# Patient Record
Sex: Female | Born: 1940
Health system: Southern US, Community
[De-identification: ages and names within clinical notes are randomized; demographics above are authoritative.]

## PROBLEM LIST (undated history)

## (undated) DIAGNOSIS — F419 Anxiety disorder, unspecified: Secondary | ICD-10-CM

## (undated) DIAGNOSIS — K219 Gastro-esophageal reflux disease without esophagitis: Secondary | ICD-10-CM

## (undated) DIAGNOSIS — R51 Headache: Secondary | ICD-10-CM

## (undated) DIAGNOSIS — I499 Cardiac arrhythmia, unspecified: Secondary | ICD-10-CM

## (undated) DIAGNOSIS — K449 Diaphragmatic hernia without obstruction or gangrene: Secondary | ICD-10-CM

## (undated) DIAGNOSIS — R569 Unspecified convulsions: Secondary | ICD-10-CM

## (undated) DIAGNOSIS — K222 Esophageal obstruction: Secondary | ICD-10-CM

## (undated) DIAGNOSIS — I493 Ventricular premature depolarization: Secondary | ICD-10-CM

## (undated) DIAGNOSIS — E785 Hyperlipidemia, unspecified: Secondary | ICD-10-CM

## (undated) DIAGNOSIS — R011 Cardiac murmur, unspecified: Secondary | ICD-10-CM

## (undated) DIAGNOSIS — I10 Essential (primary) hypertension: Secondary | ICD-10-CM

## (undated) DIAGNOSIS — K5730 Diverticulosis of large intestine without perforation or abscess without bleeding: Secondary | ICD-10-CM

## (undated) DIAGNOSIS — T7840XA Allergy, unspecified, initial encounter: Secondary | ICD-10-CM

## (undated) DIAGNOSIS — I509 Heart failure, unspecified: Secondary | ICD-10-CM

## (undated) DIAGNOSIS — I059 Rheumatic mitral valve disease, unspecified: Secondary | ICD-10-CM

## (undated) DIAGNOSIS — D649 Anemia, unspecified: Secondary | ICD-10-CM

## (undated) HISTORY — DX: Anxiety disorder, unspecified: F41.9

## (undated) HISTORY — PX: COLONOSCOPY: SHX174

## (undated) HISTORY — PX: TUBAL LIGATION: SHX77

## (undated) HISTORY — DX: Cardiac arrhythmia, unspecified: I49.9

## (undated) HISTORY — DX: Heart failure, unspecified: I50.9

## (undated) HISTORY — DX: Headache: R51

## (undated) HISTORY — DX: Esophageal obstruction: K22.2

## (undated) HISTORY — DX: Hyperlipidemia, unspecified: E78.5

## (undated) HISTORY — DX: Essential (primary) hypertension: I10

## (undated) HISTORY — PX: ADENOIDECTOMY: SUR15

## (undated) HISTORY — DX: Gastro-esophageal reflux disease without esophagitis: K21.9

## (undated) HISTORY — DX: Allergy, unspecified, initial encounter: T78.40XA

## (undated) HISTORY — DX: Anemia, unspecified: D64.9

## (undated) HISTORY — PX: BREAST EXCISIONAL BIOPSY: SUR124

## (undated) HISTORY — DX: Diverticulosis of large intestine without perforation or abscess without bleeding: K57.30

## (undated) HISTORY — DX: Cardiac murmur, unspecified: R01.1

## (undated) HISTORY — DX: Diaphragmatic hernia without obstruction or gangrene: K44.9

## (undated) HISTORY — PX: ESOPHAGOGASTRODUODENOSCOPY: SHX1529

## (undated) HISTORY — DX: Unspecified convulsions: R56.9

---
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
## 1985-01-26 HISTORY — PX: APPENDECTOMY: SHX54

## 1985-01-26 HISTORY — PX: ABDOMINAL HYSTERECTOMY: SHX81

## 1988-01-27 HISTORY — PX: BREAST SURGERY: SHX581

## 1991-01-27 HISTORY — PX: BREAST EXCISIONAL BIOPSY: SUR124

## 2006-10-25 ENCOUNTER — Encounter: Payer: Self-pay | Admitting: Internal Medicine

## 2006-10-26 ENCOUNTER — Ambulatory Visit: Payer: Self-pay | Admitting: Internal Medicine

## 2006-10-26 DIAGNOSIS — R011 Cardiac murmur, unspecified: Secondary | ICD-10-CM | POA: Insufficient documentation

## 2006-10-26 DIAGNOSIS — J309 Allergic rhinitis, unspecified: Secondary | ICD-10-CM | POA: Insufficient documentation

## 2006-10-26 DIAGNOSIS — R51 Headache: Secondary | ICD-10-CM | POA: Insufficient documentation

## 2006-10-26 DIAGNOSIS — I10 Essential (primary) hypertension: Secondary | ICD-10-CM | POA: Insufficient documentation

## 2006-10-26 DIAGNOSIS — R5383 Other fatigue: Secondary | ICD-10-CM | POA: Insufficient documentation

## 2006-10-26 DIAGNOSIS — R569 Unspecified convulsions: Secondary | ICD-10-CM | POA: Insufficient documentation

## 2006-10-26 DIAGNOSIS — E785 Hyperlipidemia, unspecified: Secondary | ICD-10-CM | POA: Insufficient documentation

## 2006-10-26 DIAGNOSIS — R519 Headache, unspecified: Secondary | ICD-10-CM | POA: Insufficient documentation

## 2006-10-26 DIAGNOSIS — R5381 Other malaise: Secondary | ICD-10-CM | POA: Insufficient documentation

## 2006-10-26 DIAGNOSIS — M949 Disorder of cartilage, unspecified: Secondary | ICD-10-CM

## 2006-10-26 DIAGNOSIS — M899 Disorder of bone, unspecified: Secondary | ICD-10-CM | POA: Insufficient documentation

## 2006-10-26 DIAGNOSIS — N951 Menopausal and female climacteric states: Secondary | ICD-10-CM | POA: Insufficient documentation

## 2006-10-26 DIAGNOSIS — Z87448 Personal history of other diseases of urinary system: Secondary | ICD-10-CM | POA: Insufficient documentation

## 2006-10-26 LAB — CONVERTED CEMR LAB
Blood in Urine, dipstick: NEGATIVE
Calcium, Total (PTH): 9.5 mg/dL (ref 8.4–10.5)
Glucose, Urine, Semiquant: NEGATIVE
Ketones, urine, test strip: NEGATIVE
Nitrite: NEGATIVE
Protein, U semiquant: NEGATIVE
Specific Gravity, Urine: 1.01
Thyroperoxidase Ab SerPl-aCnc: 35.2 (ref 0.0–60.0)
pH: 7

## 2006-11-10 LAB — CONVERTED CEMR LAB
ALT: 16 units/L (ref 0–35)
AST: 16 units/L (ref 0–37)
Albumin: 4.1 g/dL (ref 3.5–5.2)
BUN: 17 mg/dL (ref 6–23)
Basophils Absolute: 0 10*3/uL (ref 0.0–0.1)
Calcium: 9.7 mg/dL (ref 8.4–10.5)
Chloride: 104 meq/L (ref 96–112)
Eosinophils Absolute: 0.1 10*3/uL (ref 0.0–0.6)
Free T4: 0.9 ng/dL (ref 0.6–1.6)
GFR calc non Af Amer: 67 mL/min
HDL: 72 mg/dL (ref >39.0)
MCHC: 35.3 g/dL (ref 30.0–36.0)
MCV: 89.5 fL (ref 78.0–100.0)
Monocytes Relative: 9.4 % (ref 3.0–11.0)
Platelets: 213 10*3/uL (ref 150–400)
RBC: 4.21 M/uL (ref 3.87–5.11)
Sodium: 140 meq/L (ref 135–145)
TSH: 1.5 microintl units/mL (ref 0.35–5.50)
Triglycerides: 49 mg/dL (ref 0–149)

## 2006-11-18 ENCOUNTER — Ambulatory Visit: Payer: Self-pay | Admitting: Internal Medicine

## 2006-11-26 ENCOUNTER — Encounter: Admission: RE | Admit: 2006-11-26 | Discharge: 2006-11-26 | Payer: Self-pay | Admitting: Internal Medicine

## 2006-11-30 ENCOUNTER — Ambulatory Visit: Payer: Self-pay | Admitting: Pulmonary Disease

## 2006-12-01 ENCOUNTER — Ambulatory Visit (HOSPITAL_BASED_OUTPATIENT_CLINIC_OR_DEPARTMENT_OTHER): Admission: RE | Admit: 2006-12-01 | Discharge: 2006-12-01 | Payer: Self-pay | Admitting: Pulmonary Disease

## 2006-12-21 ENCOUNTER — Ambulatory Visit: Payer: Self-pay | Admitting: Pulmonary Disease

## 2006-12-21 ENCOUNTER — Telehealth (INDEPENDENT_AMBULATORY_CARE_PROVIDER_SITE_OTHER): Payer: Self-pay | Admitting: *Deleted

## 2006-12-30 ENCOUNTER — Encounter: Payer: Self-pay | Admitting: Pulmonary Disease

## 2006-12-30 ENCOUNTER — Ambulatory Visit: Payer: Self-pay | Admitting: Pulmonary Disease

## 2006-12-30 DIAGNOSIS — G4710 Hypersomnia, unspecified: Secondary | ICD-10-CM | POA: Insufficient documentation

## 2007-01-27 HISTORY — PX: CARDIAC CATHETERIZATION: SHX172

## 2007-02-01 ENCOUNTER — Other Ambulatory Visit: Admission: RE | Admit: 2007-02-01 | Discharge: 2007-02-01 | Payer: Self-pay | Admitting: Internal Medicine

## 2007-02-01 ENCOUNTER — Encounter: Payer: Self-pay | Admitting: Internal Medicine

## 2007-02-01 ENCOUNTER — Ambulatory Visit: Payer: Self-pay | Admitting: Internal Medicine

## 2007-02-18 ENCOUNTER — Ambulatory Visit: Payer: Self-pay | Admitting: Internal Medicine

## 2007-03-01 ENCOUNTER — Ambulatory Visit: Payer: Self-pay | Admitting: Internal Medicine

## 2007-03-22 ENCOUNTER — Ambulatory Visit: Payer: Self-pay

## 2007-03-22 ENCOUNTER — Encounter: Payer: Self-pay | Admitting: Internal Medicine

## 2007-03-25 ENCOUNTER — Ambulatory Visit: Payer: Self-pay | Admitting: Internal Medicine

## 2007-03-29 ENCOUNTER — Ambulatory Visit: Payer: Self-pay | Admitting: Internal Medicine

## 2007-03-29 LAB — CONVERTED CEMR LAB
Calcium: 9.3 mg/dL (ref 8.4–10.5)
Eosinophils Absolute: 0.1 10*3/uL (ref 0.0–0.6)
Eosinophils Relative: 0.8 % (ref 0.0–5.0)
GFR calc Af Amer: 81 mL/min
GFR calc non Af Amer: 67 mL/min
Glucose, Bld: 92 mg/dL (ref 70–99)
Lymphocytes Relative: 23.9 % (ref 12.0–46.0)
MCV: 92.2 fL (ref 78.0–100.0)
Monocytes Absolute: 0.7 10*3/uL (ref 0.2–0.7)
Neutro Abs: 7 10*3/uL (ref 1.4–7.7)
Neutrophils Relative %: 67.6 % (ref 43.0–77.0)
Platelets: 205 10*3/uL (ref 150–400)
Potassium: 3.9 meq/L (ref 3.5–5.1)
Sodium: 139 meq/L (ref 135–145)
WBC: 10.4 10*3/uL (ref 4.5–10.5)

## 2007-03-30 ENCOUNTER — Inpatient Hospital Stay (HOSPITAL_BASED_OUTPATIENT_CLINIC_OR_DEPARTMENT_OTHER): Admission: RE | Admit: 2007-03-30 | Discharge: 2007-03-30 | Payer: Self-pay | Admitting: Cardiology

## 2007-03-30 ENCOUNTER — Ambulatory Visit: Payer: Self-pay | Admitting: Cardiology

## 2007-04-13 ENCOUNTER — Ambulatory Visit: Payer: Self-pay | Admitting: Internal Medicine

## 2007-06-08 ENCOUNTER — Ambulatory Visit: Payer: Self-pay | Admitting: Internal Medicine

## 2007-09-28 ENCOUNTER — Ambulatory Visit: Payer: Self-pay | Admitting: Internal Medicine

## 2007-09-28 DIAGNOSIS — R1011 Right upper quadrant pain: Secondary | ICD-10-CM | POA: Insufficient documentation

## 2007-09-28 DIAGNOSIS — R252 Cramp and spasm: Secondary | ICD-10-CM | POA: Insufficient documentation

## 2007-10-04 ENCOUNTER — Encounter: Admission: RE | Admit: 2007-10-04 | Discharge: 2007-10-04 | Payer: Self-pay | Admitting: Internal Medicine

## 2007-10-06 DIAGNOSIS — K7689 Other specified diseases of liver: Secondary | ICD-10-CM | POA: Insufficient documentation

## 2007-10-09 ENCOUNTER — Encounter: Payer: Self-pay | Admitting: Internal Medicine

## 2007-10-13 ENCOUNTER — Encounter: Admission: RE | Admit: 2007-10-13 | Discharge: 2007-10-13 | Payer: Self-pay | Admitting: Internal Medicine

## 2007-10-24 ENCOUNTER — Ambulatory Visit: Payer: Self-pay | Admitting: Internal Medicine

## 2007-10-24 DIAGNOSIS — G5760 Lesion of plantar nerve, unspecified lower limb: Secondary | ICD-10-CM | POA: Insufficient documentation

## 2007-11-02 ENCOUNTER — Ambulatory Visit: Payer: Self-pay | Admitting: Internal Medicine

## 2007-11-03 ENCOUNTER — Ambulatory Visit (HOSPITAL_COMMUNITY): Admission: RE | Admit: 2007-11-03 | Discharge: 2007-11-03 | Payer: Self-pay | Admitting: Internal Medicine

## 2007-11-18 ENCOUNTER — Ambulatory Visit: Payer: Self-pay | Admitting: Gastroenterology

## 2007-11-18 DIAGNOSIS — R1319 Other dysphagia: Secondary | ICD-10-CM | POA: Insufficient documentation

## 2007-11-18 DIAGNOSIS — K219 Gastro-esophageal reflux disease without esophagitis: Secondary | ICD-10-CM | POA: Insufficient documentation

## 2007-11-21 ENCOUNTER — Ambulatory Visit: Payer: Self-pay | Admitting: Gastroenterology

## 2007-11-21 DIAGNOSIS — K2990 Gastroduodenitis, unspecified, without bleeding: Secondary | ICD-10-CM

## 2007-11-21 DIAGNOSIS — K2970 Gastritis, unspecified, without bleeding: Secondary | ICD-10-CM | POA: Insufficient documentation

## 2007-11-21 LAB — HM COLONOSCOPY

## 2007-11-23 ENCOUNTER — Encounter: Payer: Self-pay | Admitting: Gastroenterology

## 2007-12-05 ENCOUNTER — Ambulatory Visit: Payer: Self-pay | Admitting: Internal Medicine

## 2007-12-21 ENCOUNTER — Encounter: Admission: RE | Admit: 2007-12-21 | Discharge: 2007-12-21 | Payer: Self-pay | Admitting: Internal Medicine

## 2008-01-06 ENCOUNTER — Ambulatory Visit: Payer: Self-pay | Admitting: Internal Medicine

## 2008-04-06 ENCOUNTER — Telehealth: Payer: Self-pay | Admitting: Gastroenterology

## 2008-04-16 ENCOUNTER — Encounter: Payer: Self-pay | Admitting: Internal Medicine

## 2008-04-18 ENCOUNTER — Encounter: Payer: Self-pay | Admitting: Internal Medicine

## 2008-04-18 ENCOUNTER — Ambulatory Visit: Payer: Self-pay | Admitting: Internal Medicine

## 2008-05-03 ENCOUNTER — Telehealth: Payer: Self-pay | Admitting: Gastroenterology

## 2008-05-08 ENCOUNTER — Telehealth: Payer: Self-pay | Admitting: Internal Medicine

## 2008-06-11 ENCOUNTER — Ambulatory Visit: Payer: Self-pay | Admitting: Internal Medicine

## 2008-06-11 DIAGNOSIS — J019 Acute sinusitis, unspecified: Secondary | ICD-10-CM | POA: Insufficient documentation

## 2008-06-18 ENCOUNTER — Ambulatory Visit: Payer: Self-pay | Admitting: Cardiovascular Disease

## 2008-06-18 ENCOUNTER — Ambulatory Visit: Payer: Self-pay | Admitting: Internal Medicine

## 2008-06-26 ENCOUNTER — Telehealth (INDEPENDENT_AMBULATORY_CARE_PROVIDER_SITE_OTHER): Payer: Self-pay | Admitting: *Deleted

## 2008-06-26 ENCOUNTER — Ambulatory Visit: Payer: Self-pay | Admitting: Internal Medicine

## 2008-07-23 ENCOUNTER — Telehealth: Payer: Self-pay | Admitting: *Deleted

## 2008-09-05 ENCOUNTER — Telehealth: Payer: Self-pay | Admitting: Gastroenterology

## 2008-10-17 DIAGNOSIS — K449 Diaphragmatic hernia without obstruction or gangrene: Secondary | ICD-10-CM | POA: Insufficient documentation

## 2008-10-18 ENCOUNTER — Ambulatory Visit: Payer: Self-pay | Admitting: Gastroenterology

## 2008-10-18 LAB — CONVERTED CEMR LAB
AFP-Tumor Marker: 6 ng/mL (ref 0.0–8.0)
ALT: 15 units/L (ref 0–35)
AST: 16 units/L (ref 0–37)
Basophils Relative: 0.8 % (ref 0.0–3.0)
Bilirubin, Direct: 0.1 mg/dL (ref 0.0–0.3)
CO2: 28 meq/L (ref 19–32)
Chloride: 106 meq/L (ref 96–112)
Eosinophils Absolute: 0.2 10*3/uL (ref 0.0–0.7)
Eosinophils Relative: 2.7 % (ref 0.0–5.0)
Ferritin: 71.4 ng/mL (ref 10.0–291.0)
Hemoglobin: 13.8 g/dL (ref 12.0–15.0)
Lymphocytes Relative: 36.9 % (ref 12.0–46.0)
MCHC: 34.3 g/dL (ref 30.0–36.0)
MCV: 92.2 fL (ref 78.0–100.0)
Monocytes Absolute: 0.4 10*3/uL (ref 0.1–1.0)
Neutro Abs: 3.1 10*3/uL (ref 1.4–7.7)
Potassium: 4.7 meq/L (ref 3.5–5.1)
RBC: 4.37 M/uL (ref 3.87–5.11)
Saturation Ratios: 21.4 % (ref 20.0–50.0)
Sodium: 140 meq/L (ref 135–145)
Total Protein: 6.5 g/dL (ref 6.0–8.3)
WBC: 5.9 10*3/uL (ref 4.5–10.5)

## 2008-10-24 ENCOUNTER — Encounter: Admission: RE | Admit: 2008-10-24 | Discharge: 2008-10-24 | Payer: Self-pay | Admitting: Gastroenterology

## 2008-12-21 ENCOUNTER — Encounter: Admission: RE | Admit: 2008-12-21 | Discharge: 2008-12-21 | Payer: Self-pay | Admitting: Internal Medicine

## 2009-01-26 HISTORY — PX: FOOT SURGERY: SHX648

## 2009-01-28 ENCOUNTER — Ambulatory Visit: Payer: Self-pay | Admitting: Internal Medicine

## 2009-01-28 LAB — CONVERTED CEMR LAB
ALT: 16 units/L (ref 0–35)
AST: 18 units/L (ref 0–37)
Albumin: 3.7 g/dL (ref 3.5–5.2)
Bilirubin Urine: NEGATIVE
Eosinophils Relative: 4.5 % (ref 0.0–5.0)
GFR calc non Af Amer: 76.21 mL/min (ref >60)
Glucose, Urine, Semiquant: NEGATIVE
HCT: 39.5 % (ref 36.0–46.0)
HDL: 69.6 mg/dL (ref >39.00)
Hemoglobin: 13.4 g/dL (ref 12.0–15.0)
Ketones, urine, test strip: NEGATIVE
Lymphs Abs: 2.2 10*3/uL (ref 0.7–4.0)
Monocytes Relative: 8.1 % (ref 3.0–12.0)
Neutro Abs: 2.5 10*3/uL (ref 1.4–7.7)
Potassium: 3.8 meq/L (ref 3.5–5.1)
RBC: 4.2 M/uL (ref 3.87–5.11)
Sodium: 141 meq/L (ref 135–145)
TSH: 2.43 microintl units/mL (ref 0.35–5.50)
Total Bilirubin: 0.6 mg/dL (ref 0.3–1.2)
Urobilinogen, UA: 0.2
VLDL: 20.2 mg/dL (ref 0.0–40.0)
WBC: 5.6 10*3/uL (ref 4.5–10.5)

## 2009-02-04 ENCOUNTER — Ambulatory Visit: Payer: Self-pay | Admitting: Internal Medicine

## 2009-02-04 DIAGNOSIS — R7309 Other abnormal glucose: Secondary | ICD-10-CM | POA: Insufficient documentation

## 2009-02-08 ENCOUNTER — Encounter: Payer: Self-pay | Admitting: Internal Medicine

## 2009-02-08 ENCOUNTER — Ambulatory Visit: Payer: Self-pay | Admitting: Internal Medicine

## 2009-02-13 ENCOUNTER — Telehealth: Payer: Self-pay | Admitting: Internal Medicine

## 2009-03-01 ENCOUNTER — Ambulatory Visit: Payer: Self-pay | Admitting: Internal Medicine

## 2009-08-07 ENCOUNTER — Ambulatory Visit: Payer: Self-pay | Admitting: Internal Medicine

## 2009-08-07 DIAGNOSIS — S80929A Unspecified superficial injury of unspecified lower leg, initial encounter: Secondary | ICD-10-CM

## 2009-08-07 DIAGNOSIS — S8010XA Contusion of unspecified lower leg, initial encounter: Secondary | ICD-10-CM | POA: Insufficient documentation

## 2009-08-07 DIAGNOSIS — S70919A Unspecified superficial injury of unspecified hip, initial encounter: Secondary | ICD-10-CM | POA: Insufficient documentation

## 2009-08-07 DIAGNOSIS — S90919A Unspecified superficial injury of unspecified ankle, initial encounter: Secondary | ICD-10-CM

## 2009-08-07 DIAGNOSIS — S70929A Unspecified superficial injury of unspecified thigh, initial encounter: Secondary | ICD-10-CM

## 2009-08-16 ENCOUNTER — Ambulatory Visit: Payer: Self-pay | Admitting: Internal Medicine

## 2009-10-30 ENCOUNTER — Ambulatory Visit: Payer: Self-pay | Admitting: Internal Medicine

## 2009-12-23 ENCOUNTER — Encounter: Admission: RE | Admit: 2009-12-23 | Discharge: 2009-12-23 | Payer: Self-pay | Admitting: Internal Medicine

## 2010-02-25 NOTE — Assessment & Plan Note (Signed)
Summary: leg trauma/dm   Vital Signs:  Patient profile:   70 year old female Menstrual status:  hysterectomy Weight:      148 pounds Temp:     98.0 degrees F oral Pulse rate:   78 / minute BP sitting:   110 / 70  (right arm) Cuff size:   regular  Vitals Entered By: Romualdo Bolk, CMA (AAMA) (August 07, 2009 2:11 PM) CC: Pt got hit in the back of the leg with a dolly filled with plastic tubs. Pt is having sharp knife pains in her left lower leg with some tingling coming down her ankle towards the top of her foot. Pt is having a hard time walking. This happen 11:15am at [employer] [facility] [employer] on [place] [place]. Pt did put ice on it for 10-15 mins.   History of Present Illness: Yanet Balliet comes in today  for acute  visit  with husband  for above .  DOA  about 11 30 am   at [facility]   .  was at the  pharmacy department  when a   cart pushed by employee   ran into  her  back left leg   full force .  she had immediate pain and tenderness       ....able to walk afterward   and  used ice and ibuprofen  x 1 .  Has some pain to calf and foot  .    now is  swelling .  No prev injury  Unsure what to do.     NOt on asa. No hx of dvt.      Preventive Screening-Counseling & Management  Alcohol-Tobacco     Alcohol drinks/day: <1     Alcohol type: wine     Smoking Status: never     Passive Smoke Exposure: no  Caffeine-Diet-Exercise     Caffeine use/day: 2     Does Patient Exercise: yes     Type of exercise: walks bid     Times/week: 7  Current Medications (verified): 1)  Multiple Vitamin   Tabs (Multiple Vitamin) .[name].. 1 By Mouth Once Daily 2)  Calcium Carbonate-Vitamin D 600-400 Mg-Unit  Tabs (Calcium Carbonate-Vitamin D) 3)  Premarin 0.625 Mg  Tabs (Estrogens Conjugated) .[name].. 1 By Mouth Once Daily 4)  Msm 1000 Mg Caps (Methylsulfonylmethane) .... One Tablet By Mouth Once Daily 5)  Verapamil Hcl Cr 180 Mg Xr24h-Cap (Verapamil Hcl) .... Take One Capsule By Mouth Daily 6)  Aspirin 81 Mg  Tabs  (Aspirin) .[name].. 1 By Mouth Once Daily 7)  Atenolol 25 Mg Tabs (Atenolol) .... Take One Tablet By Mouth Once Daily 8)  Nexium 40 Mg  Cpdr (Esomeprazole Magnesium) .[name].. 1 Capsule Each Day 30 Minutes Before Meal 9)  Krill Oil 1000 Mg Caps (Krill Oil)  Allergies (verified): 1)  ! Codeine 2)  ! Tenormin 3)  ! Neurontin 4)  ! Darvocet  Past History:  Past medical, surgical, family and social histories (including risk factors) reviewed for relevance to current acute and chronic problems.  Past Medical History: Reviewed history from 06/18/2008 and no changes required. Allergic rhinitis Congestive heart failure Headache Hyperlipidemia Hypertension Seizure disorder  inactive  G1 P1 colonoscopy 2005      Past Surgical History: Reviewed history from 06/18/2008 and no changes required. Breast Bx1990 Appendectomy87 Hysterectomy87 Tonsillectomy53 Adenoids removed   Past History:  Care Management: Cardiology: Ladona Ridgel Podiatry: Heloise Beecham Gastroenterology: Jarold Motto  Family History: Reviewed history from 10/18/2008 and no changes required. Family History of Arthritis  Family History Breast cancer 1st degree relative <50 grandparent Family History Diabetes 1st degree relative son and mother  No FH of Colon Cancer:  Social History: Reviewed history from 02/04/2009 and no changes required. Retired Married Never Smoked Alcohol use-no Drug use-no Regular exercise-yes   HH of 2  dog and birds  Review of Systems  The patient denies anorexia, fever, chest pain, syncope, abnormal bleeding, enlarged lymph nodes, and angioedema.    Physical Exam  General:  Well-developed,well-nourished,in no acute distress; alert,appropriate and cooperative throughout examination here with husband  Head:  normocephalic and atraumatic.   Heart:  normal rate and regular rhythm.   Msk:  no joint tenderness, no joint swelling, no joint warmth, no redness over joints, and no joint deformities.   Pulses:   pulses intact without delay   Extremities:  left   lower calf   at gastroc attachment  with    8 x 5 cm [place] bruising  and  swelling tender.  Achilles intact     neg homans   very tender  to touch.  Ankle without swelling  no bony tenderness Neurologic:  alert & oriented X3.  non focal   no obvious deficit gait a bit antalgic  Skin:  turgor normal.  bruising   and hematoma ( see extremity)   Psych:  Oriented X3, good eye contact, not anxious appearing, and not depressed appearing.     Impression & Recommendations:  Problem # 1:  CONTUSION, LOWER LEG, LEFT (ICD-924.10) with hematoma .   from   direct contact .   2-3 hour old injury   .   need rest and  elevation and ice  and monitoring for complications  tendon seems intact .   Problem # 2:  INJURY, LEG (ICD-916.8) Assessment: Comment Only  Complete Medication List: 1)  Multiple Vitamin Tabs (Multiple vitamin) .[name].. 1 by mouth once daily 2)  Calcium Carbonate-vitamin D 600-400 Mg-unit Tabs (Calcium carbonate-vitamin d) 3)  Premarin 0.625 Mg Tabs (Estrogens conjugated) .[name].. 1 by mouth once daily 4)  Msm 1000 Mg Caps (methylsulfonylmethane)  .... One tablet by mouth once daily 5)  Verapamil Hcl Cr 180 Mg Xr24h-cap (Verapamil hcl) .... Take one capsule by mouth daily 6)  Aspirin 81 Mg Tabs (Aspirin) .[name].. 1 by mouth once daily 7)  Atenolol 25 Mg Tabs (Atenolol) .... Take one tablet by mouth once daily 8)  Nexium 40 Mg Cpdr (Esomeprazole magnesium) .[name].. 1 capsule each day 30 minutes before meal 9)  Krill Oil 1000 Mg Caps (Krill oil)  Patient Instructions: 1)  You have  a significant    left  muscle calf contusion  and hematoma . 2)  Leg up and rest for the next 24-48 hours with ice and compression.    Ice 10 minutes   off 20      Off an on to decrease swelling   and  pain.  3)  ROV   next week or as needed . 4)  call  if  concerns in the meantime .

## 2010-02-25 NOTE — Assessment & Plan Note (Signed)
Summary: CPX/CJR   Vital Signs:  Patient profile:   70 year old female Menstrual status:  hysterectomy Height:      65 inches Weight:      149 pounds BMI:     24.88 Pulse rate:   60 / minute BP sitting:   100 / 62  (right arm) Cuff size:   regular  Vitals Entered By: Romualdo Bolk, CMA (AAMA) (February 04, 2009 10:04 AM) CC: CPX- Discuss doing a pap   History of Present Illness: Alisha Brown comesin today for above.  Since last visit  here  there have been no major changes in health status  . Last pap years ago no abnormals.   had hyst for non cancer reasons HRT: tried to back off  of meds and got poor sleep and hot flushes.  Abd Pain: Dr Jarold Motto felt not related  Poss wall pain . NO progression or new associated signs . burning pain like  before making dinner  better  after laying down.     HT,Arrythmia: MVP  To see Dr Ladona Ridgel  Feb 4th. on meds and no change in symptom .    Preventive Care Screening  Last Tetanus Booster:    Date:  01/27/2003    Results:  Historical   Prior Values:    Mammogram:  ASSESSMENT: Negative - BI-RADS 1^MM DIGITAL SCREENING (12/21/2008)    Colonoscopy:  Location:  [facility].   (11/21/2007)   Preventive Screening-Counseling & Management  Alcohol-Tobacco     Alcohol drinks/day: <1     Alcohol type: wine     Smoking Status: never     Passive Smoke Exposure: no  Caffeine-Diet-Exercise     Caffeine use/day: 2     Does Patient Exercise: yes     Type of exercise: walks bid     Times/week: 7  Hep-HIV-STD-Contraception     Sun Exposure-Excessive: no  Safety-Violence-Falls     Seat Belt Use: 100     Smoke Detectors: yes  Current Medications (verified): 1)  Multiple Vitamin   Tabs (Multiple Vitamin) .[name].. 1 By Mouth Once Daily 2)  Calcium Carbonate-Vitamin D 600-400 Mg-Unit  Tabs (Calcium Carbonate-Vitamin D) 3)  Premarin 0.625 Mg  Tabs (Estrogens Conjugated) .[name].. 1 By Mouth Once Daily 4)  Msm 1000 Mg Caps  (Methylsulfonylmethane) .... One Tablet By Mouth Once Daily 5)  Verapamil Hcl Cr 180 Mg Xr24h-Cap (Verapamil Hcl) .... Take One Capsule By Mouth Daily 6)  Aspirin 81 Mg  Tabs (Aspirin) .[name].. 1 By Mouth Once Daily 7)  Atenolol 25 Mg Tabs (Atenolol) .... Take One Half Tablet By Mouth Daily 8)  Nexium 40 Mg  Cpdr (Esomeprazole Magnesium) .[name].. 1 Capsule Each Day 30 Minutes Before Meal  Allergies (verified): 1)  ! Codeine 2)  ! Tenormin 3)  ! Neurontin 4)  ! Darvocet  Past History:  Past medical, surgical, family and social histories (including risk factors) reviewed, and no changes noted (except as noted below).  Past Medical History: Reviewed history from 06/18/2008 and no changes required. Allergic rhinitis Congestive heart failure Headache Hyperlipidemia Hypertension Seizure disorder  inactive  G1 P1 colonoscopy 2005      Past Surgical History: Reviewed history from 06/18/2008 and no changes required. Breast Bx1990 Appendectomy87 Hysterectomy87 Tonsillectomy53 Adenoids removed   Past History:  Care Management: Cardiology: Ladona Ridgel Podiatry: Heloise Beecham Gastroenterology: Jarold Motto  Family History: Reviewed history from 10/18/2008 and no changes required. Family History of Arthritis Family History Breast cancer 1st degree relative <50 grandparent  Family History Diabetes 1st degree relative son and mother  No FH of Colon Cancer:  Social History: Reviewed history from 06/18/2008 and no changes required. Retired Married Never Smoked Alcohol use-no Drug use-no Regular exercise-yes   HH of 2  dog and birds Sun Exposure-Excessive:  no  Review of Systems  The patient denies anorexia, fever, weight loss, weight gain, vision loss, decreased hearing, hoarseness, chest pain, syncope, dyspnea on exertion, peripheral edema, prolonged cough, headaches, hemoptysis, abdominal pain, melena, hematochezia, severe indigestion/heartburn, hematuria, muscle weakness, suspicious skin  lesions, transient blindness, difficulty walking, depression, unusual weight change, abnormal bleeding, enlarged lymph nodes, angioedema, and breast masses.         side pain   as before.   some limitation  of exercise  in cold.   no chest pain.    Physical Exam General Appearance: well developed, well nourished, no acute distress Eyes: conjunctiva and lids normal, PERRLA, EOMI,  WNL Ears, Nose, Mouth, Throat: TM clear, nares clear, oral exam WNL Neck: supple, no lymphadenopathy, no thyromegaly, no JVD Respiratory: clear to auscultation and percussion, respiratory effort normal Cardiovascular: regular rate and rhythm, S1-S2, intermittent premature beat and 1/6 systolic mlsb no radiation, rub or gallop, no bruits, peripheral pulses normal and symmetric, no cyanosis, clubbing, edema or varicosities Chest: no scars, masses, tenderness; no asymmetry, skin changes, nipple discharge   Gastrointestinal: soft, non-tender; no hepatosplenomegaly, masses; active bowel sounds all quadrants, rectal per GI   points to midline rib  T 10 area of discomfort near CC junction.  no mass or crepitus Genitourinary: no vaginal discharge, lesions; no masses or tenderness cx absent  Lymphatic: no cervical, axillary or inguinal adenopathy Musculoskeletal: gait normal, muscle tone and strength WNL, no joint swelling, effusions, discoloration, crepitus  Skin: clear, good turgor, color WNL, no rashes, lesions, or ulcerations Neurologic: normal mental status, normal reflexes, normal strength, sensation, and motion Psychiatric: alert; oriented to person, place and time Other Exam: reviewed labs   elevated Tc and FBS    Impression & Recommendations:  Problem # 1:  HEALTH MAINTENANCE EXAM (ICD-V70.0) Discussed nutrition,exercise,diet,healthy weight, vitamin D and calcium.   rec DEXa not done  in last 3-4 years .  Problem # 2:  HYPERLIPIDEMIA (ICD-272.4) lifestyle intervention for now  Labs Reviewed: SGOT: 18  (01/28/2009)   SGPT: 16 (01/28/2009)   HDL:69.60 (01/28/2009), 72.0 (10/26/2006)  LDL:DEL (10/26/2006)  Chol:228 (01/28/2009), 242 (10/26/2006)  Trig:101.0 (01/28/2009), 49 (10/26/2006)  Problem # 3:  HYPERGLYCEMIA (ICD-790.29) borderline   counseled .  follow n  Problem # 4:  ROUTINE GYNECOLOGICAL EXAMINATION (ICD-V72.31) normal pelvic  Problem # 5:  MENOPAUSE-RELATED VASOMOTOR SYMPTOMS, HOT FLASHES (ICD-627.2) minimize use as possible  Her updated medication list for this problem includes:    Premarin 0.625 Mg Tabs (Estrogens conjugated) .[name]... 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 1 by mouth once daily    Atenolol 25 Mg Tabs (Atenolol) .[name]... Take one half tablet by mouth daily  Problem # 7:  HYPERTENSION (ICD-401.9)  Her updated medication list for this problem includes:    Verapamil Hcl Cr 180 Mg Xr24h-cap (Verapamil hcl) .[name]... Take one capsule by mouth daily    Atenolol 25 Mg Tabs (Atenolol) .[name]... 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 1 by mouth once daily 2)  Calcium Carbonate-vitamin D 600-400 Mg-unit Tabs (Calcium carbonate-vitamin d) 3)  Premarin 0.625 Mg Tabs (Estrogens conjugated) .[name].. 1 by mouth once daily 4)  Msm 1000 Mg Caps (methylsulfonylmethane)  .... One tablet by mouth once daily 5)  Verapamil Hcl Cr 180 Mg Xr24h-cap (Verapamil  hcl) .... Take one capsule by mouth daily 6)  Aspirin 81 Mg Tabs (Aspirin) .[name].. 1 by mouth once daily 7)  Atenolol 25 Mg Tabs (Atenolol) .... Take one half tablet by mouth daily 8)  Nexium 40 Mg Cpdr (Esomeprazole magnesium) .[name].. 1 capsule each day 30 minutes before meal  Other Orders: Admin 1st Vaccine (04540) Flu Vaccine 3yrs + (98119)  Patient Instructions: 1)  ok to minimize  hrt for now.  2)  decrease sugars and sweets.   3)  increase exercise as tolerated.  4)  consider yoga and or tai  chi. 5)  Avoid animal fats   trans fats  to get lipids better.  6)  recheck one year of as needed. 7)  DEXa scan  to be done ..will call result.  Contraindications/Deferment of Procedures/Staging:    Test/Procedure: Pneumovax vaccine    Reason for deferment: patient declined     Test/Procedure: Zoster vaccine    Reason for deferment: declined  Flu Vaccine Consent Questions     Do you have a history of severe allergic reactions to this vaccine? no    Any prior history of allergic reactions to egg and/or gelatin? no    Do you have a sensitivity to the preservative Thimersol? no    Do you have a past history of Guillan-Barre Syndrome? no    Do you currently have an acute febrile illness? no    Have you ever had a severe reaction to latex? no    Vaccine information given and explained to patient? yes    Are you currently pregnant? no    Lot Number:[id number]   Exp Date:07/25/2009   Site Given  Left Deltoid IMbflu Romualdo Bolk, CMA (AAMA)  February 04, 2009 11:18 AM

## 2010-02-25 NOTE — Assessment & Plan Note (Signed)
Summary: ONE WK ROV // RS   Vital Signs:  Patient profile:   70 year old female Menstrual status:  hysterectomy Weight:      149 pounds Pulse rate:   66 / minute BP sitting:   110 / 62  (right arm) Cuff size:   regular  Vitals Entered By: Romualdo Bolk, CMA (AAMA) (August 16, 2009 1:47 PM) CC: Follow-up visit- Pt has a knot on back of left leg with sensitivity to touch. Pt also has a knot on the front of same leg as well.   History of Present Illness: Alisha Brown comes in today  for follow up of her injury to left lower extermitiy.   Her pain  is a lot better  and now can walk   without problem   still tender to touch and   swollen in that area. No aable to walk normally without a limp.  Noted swelling prominence nt  at left lateral to tibia mid leg ? if new or important.   Preventive Screening-Counseling & Management  Alcohol-Tobacco     Alcohol drinks/day: <1     Alcohol type: wine     Smoking Status: never     Passive Smoke Exposure: no  Caffeine-Diet-Exercise     Caffeine use/day: 2     Does Patient Exercise: yes     Type of exercise: walks bid     Times/week: 7  Current Medications (verified): 1)  Multiple Vitamin   Tabs (Multiple Vitamin) .[name].. 1 By Mouth Once Daily 2)  Calcium Carbonate-Vitamin D 600-400 Mg-Unit  Tabs (Calcium Carbonate-Vitamin D) 3)  Premarin 0.625 Mg  Tabs (Estrogens Conjugated) .[name].. 1 By Mouth Once Daily 4)  Msm 1000 Mg Caps (Methylsulfonylmethane) .... One Tablet By Mouth Once Daily 5)  Verapamil Hcl Cr 180 Mg Xr24h-Cap (Verapamil Hcl) .... Take One Capsule By Mouth Daily 6)  Aspirin 81 Mg  Tabs (Aspirin) .[name].. 1 By Mouth Once Daily 7)  Atenolol 25 Mg Tabs (Atenolol) .... Take One Tablet By Mouth Once Daily 8)  Nexium 40 Mg  Cpdr (Esomeprazole Magnesium) .[name].. 1 Capsule Each Day 30 Minutes Before Meal 9)  Krill Oil 1000 Mg Caps (Krill Oil)  Allergies (verified): 1)  ! Codeine 2)  ! Tenormin 3)  ! Neurontin 4)  ! Darvocet  Past  History:  Past medical, surgical, family and social histories (including risk factors) reviewed for relevance to current acute and chronic problems.  Past Medical History: Reviewed history from 06/18/2008 and no changes required. Allergic rhinitis Congestive heart failure Headache Hyperlipidemia Hypertension Seizure disorder  inactive  G1 P1 colonoscopy 2005      Past Surgical History: Reviewed history from 06/18/2008 and no changes required. Breast Bx1990 Appendectomy87 Hysterectomy87 Tonsillectomy53 Adenoids removed   Past History:  Care Management: Cardiology: Ladona Ridgel Podiatry: Heloise Beecham Gastroenterology: Jarold Motto  Family History: Reviewed history from 10/18/2008 and no changes required. Family History of Arthritis Family History Breast cancer 1st degree relative <50 grandparent Family History Diabetes 1st degree relative son and mother  No FH of Colon Cancer:  Social History: Reviewed history from 02/04/2009 and no changes required. Retired Married Never Smoked Alcohol use-no Drug use-no Regular exercise-yes   HH of 2  dog and birds  Review of Systems       no numbness other bruising    joint swelling  Physical Exam  General:  Well-developed,well-nourished,in no acute distress; alert,appropriate and cooperative throughout examination Extremities:  left LE  nl rom function walking and strength .  still with 5 cm lump and fading bruising over posterior distal calf.  Achilles intact and neg homans sign.   No joint swelling..  Left paratibial area with  slight swelling palpable non tender  no bony changes .  Gait and heel toe walk is normal now.  Neurologic:  no acute findings Skin:  turgor normal and color normal.  brising still present  posterior distal left calf area.  Psych:  Oriented X3, good eye contact, not anxious appearing, and not depressed appearing.     Impression & Recommendations:  Problem # 1:  INJURY, LEG (ICD-916.8) Assessment  Improved expect recovery    but will be tender for a while   Problem # 2:  CONTUSION, LOWER LEG, LEFT (ICD-924.10) Assessment: Improved see above  Complete Medication List: 1)  Multiple Vitamin Tabs (Multiple vitamin) .[name].. 1 by mouth once daily 2)  Calcium Carbonate-vitamin D 600-400 Mg-unit Tabs (Calcium carbonate-vitamin d) 3)  Premarin 0.625 Mg Tabs (Estrogens conjugated) .[name].. 1 by mouth once daily 4)  Msm 1000 Mg Caps (methylsulfonylmethane)  .... One tablet by mouth once daily 5)  Verapamil Hcl Cr 180 Mg Xr24h-cap (Verapamil hcl) .... Take one capsule by mouth daily 6)  Aspirin 81 Mg Tabs (Aspirin) .[name].. 1 by mouth once daily 7)  Atenolol 25 Mg Tabs (Atenolol) .... Take one tablet by mouth once daily 8)  Nexium 40 Mg Cpdr (Esomeprazole magnesium) .[name].. 1 capsule each day 30 minutes before meal 9)  Krill Oil 1000 Mg Caps (Krill oil)  Patient Instructions: 1)  i think you will continue to recover.   with no dysfunction. 2)  Ice if needed. 3)  Activity as tolerated . 4)  You may have a lump  for a while  ( months)   when tenderness is gone can use heat and massage  to decrease the  swelling area( hematoma)

## 2010-02-25 NOTE — Assessment & Plan Note (Signed)
Summary: rov  Medications Added ATENOLOL 25 MG TABS (ATENOLOL) take one tablet by mouth once daily      Allergies Added:   Referring Provider:  n/a Primary Provider:  Malena Edman  CC:  pt states no complaints at this time.  She did point out that she has been having sinus headaches and pressure for the last week and her BP is higher than normal.  She also states that Dr. Fabian Sharp wanted her cardiologist to be aware that her coping method for chest pain is Brandy and water.  [name]  History of Present Illness: [name] returns today for followup.  She is a pleasant 70 yo woman with a h/o NSVT and chronic chest pain.  She has had her dose of Verapamil and atenolol adjusted over the years.  She denies c/p.  She has had minimal palpitations since we last saw her.  She has occaisional non-exertional episodes of c/p that she treats with [facility] and water with improvement.  Current Medications (verified): 1)  Multiple Vitamin   Tabs (Multiple Vitamin) .[name].. 1 By Mouth Once Daily 2)  Calcium Carbonate-Vitamin D 600-400 Mg-Unit  Tabs (Calcium Carbonate-Vitamin D) 3)  Premarin 0.625 Mg  Tabs (Estrogens Conjugated) .[name].. 1 By Mouth Once Daily 4)  Msm 1000 Mg Caps (Methylsulfonylmethane) .... One Tablet By Mouth Once Daily 5)  Verapamil Hcl Cr 180 Mg Xr24h-Cap (Verapamil Hcl) .... Take One Capsule By Mouth Daily 6)  Aspirin 81 Mg  Tabs (Aspirin) .[name].. 1 By Mouth Once Daily 7)  Atenolol 25 Mg Tabs (Atenolol) .... Take One Half Tablet By Mouth Daily 8)  Nexium 40 Mg  Cpdr (Esomeprazole Magnesium) .[name].. 1 Capsule Each Day 30 Minutes Before Meal  Allergies (verified): 1)  ! Codeine 2)  ! Tenormin 3)  ! Neurontin 4)  ! Darvocet  Past History:  Past Medical History: Last updated: 06/18/2008 Allergic rhinitis Congestive heart failure Headache Hyperlipidemia Hypertension Seizure disorder  inactive  G1 P1 colonoscopy 2005      Past Surgical History: Last updated: 06/18/2008 Breast  Bx1990 Appendectomy87 Hysterectomy87 Tonsillectomy53 Adenoids removed   Review of Systems       The patient complains of chest pain.  The patient denies syncope, dyspnea on exertion, and peripheral edema.    Vital Signs:  Patient profile:   70 year old female Menstrual status:  hysterectomy Height:      65 inches Weight:      150 pounds BMI:     25.05 Pulse rate:   59 / minute Pulse rhythm:   irregular BP sitting:   126 / 82  (left arm) Cuff size:   large  Vitals Entered By: Judithe Modest CMA (March 01, 2009 11:48 AM)  Physical Exam  General:  Well developed, well nourished, no acute distress.healthy appearing.   Head:  Normocephalic and atraumatic. Eyes:  PERRLA, no icterus. Mouth:  pharynx pink and moist.  drainage tracts seen no pnd  Neck:  Supple; no masses or thyromegaly. Lungs:  Clear throughout to auscultation. No wheezes, rales, rhonchi. Heart:  Regular rate and rhythm; no murmurs, rubs,  or bruits. Abdomen:  Soft, nontender and nondistended. No masses, hepatosplenomegaly or hernias noted. Normal bowel sounds. Msk:  Symmetrical with no gross deformities. Normal posture. Pulses:  pulses normal in all 4 extremities Extremities:  No clubbing, cyanosis, edema or deformities noted. Neurologic:  Alert and  oriented x4;  grossly normal neurologically.   EKG  Procedure date:  03/01/2009  Findings:  Sinus bradycardia with rate of:  59.PVC's noted.   LAE.  ILBBB.  Impression & Recommendations:  Problem # 1:  CARDIAC ARRHYTHMIA/NONSUST VT (ICD-427.9) She has had NSVT in the past which now appears to be asymptomatic in the setting of her medical therapy.  Will continue. Her updated medication list for this problem includes:    Verapamil Hcl Cr 180 Mg Xr24h-cap (Verapamil hcl) .[name]... Take one capsule by mouth daily    Aspirin 81 Mg Tabs (Aspirin) .[name]... 1 by mouth once daily    Atenolol 25 Mg Tabs (Atenolol) .[name]... Take one tablet by mouth once  daily  Orders: EKG w/ Interpretation (93000)  Problem # 2:  HYPERTENSION (ICD-401.9) Her blood pressure appears to remain well controlled. A low sodium diet is recommended. Her updated medication list for this problem includes:    Verapamil Hcl Cr 180 Mg Xr24h-cap (Verapamil hcl) .[name]... Take one capsule by mouth daily    Aspirin 81 Mg Tabs (Aspirin) .[name]... 1 by mouth once daily    Atenolol 25 Mg Tabs (Atenolol) .[name]... 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  Continue current meds. Her updated medication list for this problem includes:    Verapamil Hcl Cr 180 Mg Xr24h-cap (Verapamil hcl) .[name]... Take one capsule by mouth daily    Aspirin 81 Mg Tabs (Aspirin) .[name]... 1 by mouth once daily    Atenolol 25 Mg Tabs (Atenolol) .[name]... Take one tablet by mouth once daily Prescriptions: ATENOLOL 25 MG TABS (ATENOLOL) take one tablet by mouth once daily  #90 x 3   Entered by:   Judithe Modest CMA   Authorized by:   Laren Boom, MD, [facility]   Signed by:   Judithe Modest CMA on 03/01/2009   Method used:   Electronically to        [facility] # [phone]* (retail)       [address]       [place], [place]  [address]       Ph: [phone]       Fax: [phone]   RxID:   [id number] VERAPAMIL HCL CR 180 MG XR24H-CAP (VERAPAMIL HCL) Take one capsule by mouth daily  #90 x 3   Entered by:   Judithe Modest CMA   Authorized by:   Laren Boom, MD, [facility]   Signed by:   Judithe Modest CMA on 03/01/2009   Method used:   Electronically to        [facility] [address] # [phone]* (retail)       [address]       [place], [place]  [address]       Ph: [phone]       Fax: [phone]   RxID:   [id number]

## 2010-02-25 NOTE — Miscellaneous (Signed)
Summary: BONE DENSITY  Clinical Lists Changes  Orders: Added new Test order of T-Bone Densitometry (77080) - Signed Added new Test order of T-Lumbar Vertebral Assessment (77082) - Signed 

## 2010-02-25 NOTE — Assessment & Plan Note (Signed)
Summary: sinuses//ccm   Vital Signs:  Patient profile:   70 year old female Menstrual status:  hysterectomy Weight:      150 pounds Temp:     98.2 degrees F oral Pulse rate:   66 / minute BP sitting:   120 / 70  (left arm) Cuff size:   regular  Vitals Entered By: Romualdo Bolk, CMA (AAMA) (October 30, 2009 10:38 AM) CC: Sinus pressure, ha's taking mucinex and flonase, coughing and congestion x 2 weeks.   History of Present Illness: Alisha Brown comes in today  for acute issue   onset like  upper congestion and then continues with   headache .  No fever.    onset about 2 weeks ago with congestion and ear  issues and then pain behind eye  head and mucinex and ibuprofen and then zyrtec  .   Got palpitations ? from t he zyrted ? if d or now.  Minimal cough no sob or wheezing .     Preventive Screening-Counseling & Management  Alcohol-Tobacco     Alcohol drinks/day: <1     Alcohol type: wine     Smoking Status: never     Passive Smoke Exposure: no  Caffeine-Diet-Exercise     Caffeine use/day: 2     Does Patient Exercise: yes     Type of exercise: walks bid     Times/week: 7  Current Medications (verified): 1)  Multiple Vitamin   Tabs (Multiple Vitamin) .[name].. 1 By Mouth Once Daily 2)  Calcium Carbonate-Vitamin D 600-400 Mg-Unit  Tabs (Calcium Carbonate-Vitamin D) 3)  Premarin 0.625 Mg  Tabs (Estrogens Conjugated) .[name].. 1 By Mouth Once Daily 4)  Msm 1000 Mg Caps (Methylsulfonylmethane) .... One Tablet By Mouth Once Daily 5)  Verapamil Hcl Cr 180 Mg Xr24h-Cap (Verapamil Hcl) .... Take One Capsule By Mouth Daily 6)  Atenolol 25 Mg Tabs (Atenolol) .... Take One Tablet By Mouth Once Daily 7)  Nexium 40 Mg  Cpdr (Esomeprazole Magnesium) .[name].. 1 Capsule Each Day 30 Minutes Before Meal 8)  Krill Oil 1000 Mg Caps (Krill Oil)  Allergies (verified): 1)  ! Codeine 2)  ! Tenormin 3)  ! Neurontin 4)  ! Darvocet  Past History:  Past medical, surgical, family and social  histories (including risk factors) reviewed for relevance to current acute and chronic problems.  Past Medical History: Reviewed history from 06/18/2008 and no changes required. Allergic rhinitis Congestive heart failure Headache Hyperlipidemia Hypertension Seizure disorder  inactive  G1 P1 colonoscopy 2005      Past Surgical History: Reviewed history from 06/18/2008 and no changes required. Breast Bx1990 Appendectomy87 Hysterectomy87 Tonsillectomy53 Adenoids removed   Past History:  Care Management: Cardiology: Ladona Ridgel Podiatry: Heloise Beecham Gastroenterology: Jarold Motto  Family History: Reviewed history from 10/18/2008 and no changes required. Family History of Arthritis Family History Breast cancer 1st degree relative <50 grandparent Family History Diabetes 1st degree relative son and mother  No FH of Colon Cancer:  Social History: Reviewed history from 02/04/2009 and no changes required. Retired Married Never Smoked Alcohol use-no Drug use-no Regular exercise-yes   HH of 2  dog and birds  Review of Systems  The patient denies fever, vision loss, chest pain, syncope, dyspnea on exertion, peripheral edema, prolonged cough, abdominal pain, melena, hematochezia, transient blindness, difficulty walking, abnormal bleeding, enlarged lymph nodes, angioedema, hoarseness, and hemoptysis.         achy today no fever  had diarrhea weeks ago   leg still sore when  puts on lotion other wise no change  . Says crill oil giver her more energy  Physical Exam  General:  tired but in nad nontoxic     Head:  normocephalic and atraumatic.   Eyes:  vision grossly intact, pupils equal, pupils round, and pupils reactive to light.   Ears:  R ear normal, L ear normal, and no external deformities.   Nose:  no external deformity and no external erythema.  mild congestion tender maxillary area  not frontal no edema Mouth:  mild erythema    no lesions Neck:  No deformities, masses, or  tenderness noted. Lungs:  Normal respiratory effort, chest expands symmetrically. Lungs are clear to auscultation, no crackles or wheezes.no dullness.   Heart:  normal rate, no murmur, no gallop, and no lifts.   ocass premature beat  Pulses:  pulses intact without delay   Extremities:  no clubbing cyanosis or edema  Neurologic:  non focal  Skin:  turgor normal, color normal, no ecchymoses, and no petechiae.   Cervical Nodes:  No lymphadenopathy noted shoddy ac and tender right  Psych:  Oriented X3, memory intact for recent and remote, normally interactive, good eye contact, not anxious appearing, and not depressed appearing.     Impression & Recommendations:  Problem # 1:  SINUSITIS - ACUTE-NOS (ICD-461.9)  hx of same agree with restarting flonase  and continuing  . Expectant management  Her updated medication list for this problem includes:    Cefuroxime Axetil 500 Mg Tabs (Cefuroxime axetil) .[name]... 1 by mouth two times a day    Flonase 50 Mcg/act Susp (Fluticasone propionate) .[name]... 2 spray each nostril q d  Instructed on treatment. Call if symptoms persist or worsen.   Problem # 2:  Preventive Health Care (ICD-V70.0) ok for flu shot   Problem # 3:  ALLERGIC RHINITIS (ICD-477.9)  Discussed use of allergy medications and environmental measures.   Her updated medication list for this problem includes:    Flonase 50 Mcg/act Susp (Fluticasone propionate) .[name]... 2 spray each nostril q d  Complete Medication List: 1)  Multiple Vitamin Tabs (Multiple vitamin) .[name].. 1 by mouth once daily 2)  Calcium Carbonate-vitamin D 600-400 Mg-unit Tabs (Calcium carbonate-vitamin d) 3)  Premarin 0.625 Mg Tabs (Estrogens conjugated) .[name].. 1 by mouth once daily 4)  Msm 1000 Mg Caps (methylsulfonylmethane)  .... One tablet by mouth once daily 5)  Verapamil Hcl Cr 180 Mg Xr24h-cap (Verapamil hcl) .... Take one capsule by mouth daily 6)  Atenolol 25 Mg Tabs (Atenolol) .... Take one tablet by mouth once  daily 7)  Nexium 40 Mg Cpdr (Esomeprazole magnesium) .[name].. 1 capsule each day 30 minutes before meal 8)  Krill Oil 1000 Mg Caps (Krill oil) 9)  Cefuroxime Axetil 500 Mg Tabs (Cefuroxime axetil) .[name].. 1 by mouth two times a day 10)  Flonase 50 Mcg/act Susp (Fluticasone propionate) .... 2 spray each nostril q d  Patient Instructions: 1)  Acute sinusitis symptoms for less than 10 days are not helped by antibiotics. Use warm moist compresses, and  mucinex plain ( only as directed). Call if no improvement in 5-7 days, sooner if increasing pain, fever, or new symptoms.  2)  No decongestants for   now  to avoid heart racing.  3)  continue the flonase . Prescriptions: CEFUROXIME AXETIL 500 MG TABS (CEFUROXIME AXETIL) 1 by mouth two times a day  #20 x 0   Entered and Authorized by:   Madelin Headings MD  Signed by:   Madelin Headings MD on 10/30/2009   Method used:   Electronically to        [facility] [address] # [phone]* (retail)       [address]       [place], [place]  [address]       Ph: [phone]       Fax: [phone]   RxID:   [id number]   Appended Document: sinuses//ccm     Clinical Lists Changes  Orders: Added new Service order of Admin 1st Vaccine (87564) - Signed Added new Service order of Flu Vaccine 62yrs + [phone]) - Signed Observations: Added new observation of FLU VAX VIS: 08/20/09 version (10/30/2009 11:49) Added new observation of FLU VAXLOT: [id number] (10/30/2009 11:49) Added new observation of FLU VAXMFR: [employer] (10/30/2009 11:49) Added new observation of FLU VAX EXP: 07/26/2010 (10/30/2009 11:49) Added new observation of FLU VAX DSE: 0.48ml (10/30/2009 11:49) Added new observation of FLU VAX: Fluvax 3+ (10/30/2009 11:49)   Flu Vaccine Consent Questions     Do you have a history of severe allergic reactions to this vaccine? no    Any prior history of allergic reactions to egg and/or gelatin? no    Do you have a sensitivity to the preservative Thimersol?  no    Do you have a past history of Guillan-Barre Syndrome? no    Do you currently have an acute febrile illness? no    Have you ever had a severe reaction to latex? no    Vaccine information given and explained to patient? yes    Are you currently pregnant? no    Lot Number:[id number]   Exp Date:07/26/2010   Site Given  Left Deltoid IM  Romualdo Bolk, CMA (AAMA)  October 30, 2009 11:50 AM    .lbflu

## 2010-02-25 NOTE — Progress Notes (Signed)
Summary: REFILL   Phone Note Refill Request Message from:  Patient on February 13, 2009 9:44 AM  Refills Requested: Medication #1:  ATENOLOL 25 MG TABS Take one half tablet by mouth daily SENT TO [facility] [phone] [facility] [address]  Initial call taken by: Judie Grieve,  February 13, 2009 9:45 AM    Prescriptions: ATENOLOL 25 MG TABS (ATENOLOL) Take one half tablet by mouth daily  #30 x 3   Entered by:   Laurance Flatten CMA   Authorized by:   Laren Boom, MD, [facility]   Signed by:   Laurance Flatten CMA on 02/14/2009   Method used:   Electronically to        [facility] # [phone]* (retail)       [address]       [place], [place]  [address]       Ph: [phone]       Fax: [phone]   RxID:   [id number]

## 2010-03-03 ENCOUNTER — Encounter: Payer: Self-pay | Admitting: Internal Medicine

## 2010-03-05 ENCOUNTER — Ambulatory Visit: Payer: Self-pay | Admitting: Internal Medicine

## 2010-03-10 ENCOUNTER — Ambulatory Visit (INDEPENDENT_AMBULATORY_CARE_PROVIDER_SITE_OTHER): Payer: MEDICARE | Admitting: Internal Medicine

## 2010-03-10 ENCOUNTER — Encounter: Payer: Self-pay | Admitting: Internal Medicine

## 2010-03-10 DIAGNOSIS — I10 Essential (primary) hypertension: Secondary | ICD-10-CM

## 2010-03-10 DIAGNOSIS — R42 Dizziness and giddiness: Secondary | ICD-10-CM

## 2010-03-10 DIAGNOSIS — I499 Cardiac arrhythmia, unspecified: Secondary | ICD-10-CM

## 2010-03-12 ENCOUNTER — Other Ambulatory Visit (INDEPENDENT_AMBULATORY_CARE_PROVIDER_SITE_OTHER): Payer: MEDICARE | Admitting: Internal Medicine

## 2010-03-12 DIAGNOSIS — I10 Essential (primary) hypertension: Secondary | ICD-10-CM

## 2010-03-12 DIAGNOSIS — E785 Hyperlipidemia, unspecified: Secondary | ICD-10-CM

## 2010-03-12 DIAGNOSIS — Z0000 Encounter for general adult medical examination without abnormal findings: Secondary | ICD-10-CM

## 2010-03-12 LAB — BASIC METABOLIC PANEL
CO2: 32 mEq/L (ref 19–32)
Chloride: 106 mEq/L (ref 96–112)
Creatinine, Ser: 0.9 mg/dL (ref 0.4–1.2)

## 2010-03-12 LAB — LIPID PANEL
Cholesterol: 223 mg/dL — ABNORMAL HIGH (ref 0–200)
HDL: 70.2 mg/dL (ref >39.00)
Triglycerides: 45 mg/dL (ref 0.0–149.0)

## 2010-03-12 LAB — HEPATIC FUNCTION PANEL
Albumin: 3.9 g/dL (ref 3.5–5.2)
Total Protein: 6.4 g/dL (ref 6.0–8.3)

## 2010-03-13 LAB — CBC WITH DIFFERENTIAL/PLATELET
Basophils Relative: 0.6 % (ref 0.0–3.0)
Eosinophils Absolute: 0.1 10*3/uL (ref 0.0–0.7)
HCT: 39.7 % (ref 36.0–46.0)
Hemoglobin: 13.3 g/dL (ref 12.0–15.0)
Lymphs Abs: 1.9 10*3/uL (ref 0.7–4.0)
MCHC: 33.5 g/dL (ref 30.0–36.0)
MCV: 91.5 fl (ref 78.0–100.0)
Monocytes Absolute: 0.4 10*3/uL (ref 0.1–1.0)
Neutro Abs: 2.3 10*3/uL (ref 1.4–7.7)
Neutrophils Relative %: 49.2 % (ref 43.0–77.0)
RBC: 4.34 Mil/uL (ref 3.87–5.11)

## 2010-03-19 ENCOUNTER — Ambulatory Visit (INDEPENDENT_AMBULATORY_CARE_PROVIDER_SITE_OTHER): Payer: MEDICARE | Admitting: Internal Medicine

## 2010-03-19 ENCOUNTER — Encounter: Payer: Self-pay | Admitting: Internal Medicine

## 2010-03-19 VITALS — BP 120/80 | HR 66 | Ht 64.75 in | Wt 153.0 lb

## 2010-03-19 DIAGNOSIS — N951 Menopausal and female climacteric states: Secondary | ICD-10-CM

## 2010-03-19 DIAGNOSIS — R7309 Other abnormal glucose: Secondary | ICD-10-CM

## 2010-03-19 DIAGNOSIS — I10 Essential (primary) hypertension: Secondary | ICD-10-CM

## 2010-03-19 DIAGNOSIS — E785 Hyperlipidemia, unspecified: Secondary | ICD-10-CM

## 2010-03-19 DIAGNOSIS — K219 Gastro-esophageal reflux disease without esophagitis: Secondary | ICD-10-CM

## 2010-03-19 DIAGNOSIS — I499 Cardiac arrhythmia, unspecified: Secondary | ICD-10-CM

## 2010-03-19 DIAGNOSIS — I428 Other cardiomyopathies: Secondary | ICD-10-CM

## 2010-03-19 DIAGNOSIS — Z23 Encounter for immunization: Secondary | ICD-10-CM

## 2010-03-19 DIAGNOSIS — Z0000 Encounter for general adult medical examination without abnormal findings: Secondary | ICD-10-CM

## 2010-03-19 MED ORDER — ESTROGENS CONJUGATED 0.45 MG PO TABS: 0.4500 mg | ORAL_TABLET | Freq: Every day | ORAL | Status: DC

## 2010-03-19 MED ORDER — ESTROGENS CONJUGATED 0.45 MG PO TABS: ORAL_TABLET | ORAL | Status: DC

## 2010-03-19 NOTE — Patient Instructions (Signed)
Tr lower dose of  Estrogen .  Daily Continue lifestyle intervention healthy eating and exercise .  Try Tai chei

## 2010-03-19 NOTE — Assessment & Plan Note (Signed)
Summary: rov per pt call/lg rs from 2-8 per office/mt      Allergies Added:   Visit Type:  1 year follow up Referring Provider:  n/a Primary Provider:  Malena Edman  CC:  dizziness.  History of Present Illness: Alisha Brown returns today for followup. She is a pleasant 70 yo woman with a h/o NSVT and dizziness. The patient has HTN. She notes that she continues to have recurrent episodes of palpitations and dizziness. No frank syncope. She has other wise done well. No/minimal peripheral edema. She remains active. She has occaisional headaches.  Current Medications (verified): 1)  Multiple Vitamin   Tabs (Multiple Vitamin) .[name].. 1 By Mouth Once Daily 2)  Calcium Carbonate-Vitamin D 600-400 Mg-Unit  Tabs (Calcium Carbonate-Vitamin D) 3)  Premarin 0.625 Mg  Tabs (Estrogens Conjugated) .[name].. 1 By Mouth Once Daily 4)  Msm 1000 Mg Caps (Methylsulfonylmethane) .... One Tablet By Mouth Once Daily 5)  Verapamil Hcl Cr 180 Mg Xr24h-Cap (Verapamil Hcl) .... Take One Capsule By Mouth Daily 6)  Atenolol 25 Mg Tabs (Atenolol) .... Take One Tablet By Mouth Once Daily 7)  Nexium 40 Mg  Cpdr (Esomeprazole Magnesium) .[name].. 1 Capsule Each Day 30 Minutes Before Meal 8)  Krill Oil 1000 Mg Caps (Krill Oil)  Allergies (verified): 1)  ! Codeine 2)  ! Tenormin 3)  ! Neurontin 4)  ! Darvocet  Past History:  Past Medical History: Last updated: 06/18/2008 Allergic rhinitis Congestive heart failure Headache Hyperlipidemia Hypertension Seizure disorder  inactive  G1 P1 colonoscopy 2005      Past Surgical History: Last updated: 06/18/2008 Breast Bx1990 Appendectomy87 Hysterectomy87 Tonsillectomy53 Adenoids removed   Review of Systems  The patient denies chest pain, syncope, dyspnea on exertion, and peripheral edema.    Vital Signs:  Patient profile:   70 year old female Menstrual status:  hysterectomy Height:      65 inches Weight:      151.75 pounds BMI:     25.34 Pulse rate:   73 /  minute BP sitting:   120 / 72  (left arm) Cuff size:   regular  Vitals Entered By: Caralee Ates CMA (March 10, 2010 3:16 PM)  Physical Exam  General:  tired but in nad nontoxic     Head:  normocephalic and atraumatic.   Eyes:  vision grossly intact, pupils equal, pupils round, and pupils reactive to light.   Mouth:  mild erythema    no lesions Neck:  No deformities, masses, or tenderness noted. Lungs:  Normal respiratory effort, chest expands symmetrically. Lungs are clear to auscultation, no crackles or wheezes.no dullness.   Heart:  normal rate, no murmur, no gallop, and no lifts.   ocass premature beat  Abdomen:  Soft, nontender and nondistended. No masses, hepatosplenomegaly or hernias noted. Normal bowel sounds. Msk:  no joint tenderness, no joint swelling, no joint warmth, no redness over joints, and no joint deformities.   Pulses:  pulses intact without delay   Extremities:  no clubbing cyanosis or edema  Neurologic:  non focal    EKG  Procedure date:  03/10/2010  Findings:      Normal sinus rhythm with rate of: 73. PVC's noted.    Impression & Recommendations:  Problem # 1:  CARDIAC ARRHYTHMIA/NONSUST VT (ICD-427.9) Her symptoms appear to be worse then when I last saw her. I have asked her to let us know if her symptoms worsen and I would stop her atenonol and try low dose flecainide. Her updated  medication list for this problem includes:    Verapamil Hcl Cr 180 Mg Xr24h-cap (Verapamil hcl) .[name]... Take one capsule by mouth daily    Atenolol 25 Mg Tabs (Atenolol) .[name]... Take one tablet by mouth once daily  Problem # 2:  HYPERTENSION (ICD-401.9) Her blood pressure is well controlled. Will follow. Her updated medication list for this problem includes:    Verapamil Hcl Cr 180 Mg Xr24h-cap (Verapamil hcl) .[name]... Take one capsule by mouth daily    Atenolol 25 Mg Tabs (Atenolol) .[name]... Take one tablet by mouth once daily  Patient Instructions: 1)  Your physician wants you  to follow-up in:6 months with Dr Court Joy will receive a reminder letter in the mail two months in advance. If you don't receive a letter, please call our office to schedule the follow-up appointment. 2)  Your physician recommends that you continue on your current medications as directed. Please refer to the Current Medication list given to you today. Prescriptions: ATENOLOL 25 MG TABS (ATENOLOL) take one tablet by mouth once daily  #90 x 3   Entered by:   Dennis Bast, RN, BSN   Authorized by:   Laren Boom, MD, [facility]   Signed by:   Dennis Bast, RN, BSN on 03/10/2010   Method used:   Electronically to        [facility] # [phone]* (retail)       [address]       [place], [place]  [address]       Ph: [phone]       Fax: [phone]   RxID:   [id number] VERAPAMIL HCL CR 180 MG XR24H-CAP (VERAPAMIL HCL) Take one capsule by mouth daily  #90 Capsule x 3   Entered by:   Dennis Bast, RN, BSN   Authorized by:   Laren Boom, MD, [facility]   Signed by:   Dennis Bast, RN, BSN on 03/10/2010   Method used:   Electronically to        [facility] # [phone]* (retail)       [address]       [place], [place]  [address]       Ph: [phone]       Fax: [phone]   RxID:   [id number]

## 2010-03-19 NOTE — Progress Notes (Signed)
Subjective:    Alisha Brown is a 70 y.o. female who presents for  Medicare exam.  And wellness.  Since her last visit she is actually  Doing well and feeling well .  She continues to see Dr Ladona Ridgel for her NSVT and mild CM . No change in meds   Cardiac risk factors: advanced age (older than 70 for men, 67 for women), hypertension and sedentary lifestyle.  Activities of Daily Living  In your present state of health, do you have any difficulty performing the following activities?:  Preparing food and eating?: No Bathing yourself: No Getting dressed: No Using the toilet:No Moving around from place to place: No In the past year have you fallen or had a near fall?:Yes  Off a ladder     Current exercise habits: Home exercise routine includes walking 1 hrs per day.   Dietary issues discussed:     Safety reviewed :    No falls but feels off at times Hearing   Good  Vision:  No change  Depression Screen (Note: if answer to either of the following is "Yes", then a more complete depression screening is indicated)  Q1: Over the past two weeks, have you felt down, depressed or hopeless?yes  Better now   Better with vtamins  Q2: Over the past two weeks, have you felt little interest or pleasure in doing things? no  Memory : good  Advance directives:   Discussed  The following portions of the patient's history were reviewed and updated as appropriate: allergies, current medications, past family history, past medical history, past social history, past surgical history and problem list. Review of Systems A comprehensive review of systems was negative except for:    Hair shedding ..finer.  As per hpi  Fatigue better  No bleeding  .   No hot flushes . Rest neg or as per hpi  Objective:     Vision by Snellen chart: right eye:has eye doctor, left eye:has eye doctor Blood pressure 120/80, pulse 66, height 5' 4.75" (1.645 m), weight 153 lb (69.4 kg). Body mass index is 25.66 kg/(m^2). Physical  Exam: Vital signs reviewed EAV:[id number] is a well-developed well-nourished alert cooperative  white female who appears her stated age in no acute distress.  HEENT: normocephalic  traumatic , Eyes: PERRL EOM's full, conjunctiva clear, Nares: paten,t no deformity discharge or tenderness., Ears: no deformity EAC's clear TMs with normal landmarks. Mouth: clear OP, no lesions, edema.  Moist mucous membranes. Dentition in adequate repair. NECK: supple without masses, thyromegaly or bruits. CHEST/PULM:  Clear to auscultation and percussion breath sounds equal no wheeze , rales or rhonchi. No chest wall deformities or tenderness. Breast: normal by inspection . No dimpling, discharge, masses, tenderness or discharge . CV: PMI is nondisplaced, S1 S2 no gallops, murmurs, rubs. Peripheral pulses are full without delay.No JVD .  ABDOMEN: Bowel sounds normal nontender  No guard or rebound, no hepato splenomegal no CVA tenderness.  No hernia. Extremtities:  No clubbing cyanosis or edema, no acute joint swelling or redness no focal atrophy NEURO:  Oriented x3, cranial nerves 3-12 appear to be intact, no obvious focal weakness,gait within normal limits no abnormal reflexes or asymmetrical SKIN: No acute rashes normal turgor, color, no bruising or petechiae. PSYCH: Oriented, good eye contact, no obvious depression anxiety, cognition and judgment appear normal.    Assessment:  Wellness visit  HRT  Trial  To  decrease  To decrease risk   HT, CV, hx chf palpitations  Stable per cards    Plan:    During the course of the visit the patient was educated and counseled about appropriate screening and preventive services including:   Pneumococcal vaccine   Bone densitometry screening  Nutrition counseling   Advanced directives: counseled Consider  Taking.  tai chi or yoga   This can help with balance   Patient Instructions (the written plan) was given to the patient.

## 2010-03-23 DIAGNOSIS — I428 Other cardiomyopathies: Secondary | ICD-10-CM | POA: Insufficient documentation

## 2010-03-23 DIAGNOSIS — I499 Cardiac arrhythmia, unspecified: Secondary | ICD-10-CM | POA: Insufficient documentation

## 2010-03-23 DIAGNOSIS — Z0000 Encounter for general adult medical examination without abnormal findings: Secondary | ICD-10-CM | POA: Insufficient documentation

## 2010-03-23 NOTE — Assessment & Plan Note (Signed)
Stable followed by Dr Ladona Ridgel on med therapy.

## 2010-03-23 NOTE — Assessment & Plan Note (Signed)
Disc  Advising try to decrease dosing  And wean eventually  To decrease risk.   Will try .45 mg  Premarin  Doesn't want to try patches.

## 2010-04-08 LAB — HM DIABETES EYE EXAM: HM Diabetic Eye Exam: NORMAL

## 2010-04-25 ENCOUNTER — Telehealth: Payer: Self-pay | Admitting: *Deleted

## 2010-04-25 NOTE — Telephone Encounter (Signed)
Pt called saying that she takes premarin 1 qd but on her rx it states to take 1 qd and skip 7 days a month. I told pt that I would talk to the pharmacy to make sure that the directions are to take 1 qd.  I called the pharmacy and they are going to close out the old rx and fill the new one.

## 2010-06-03 ENCOUNTER — Encounter: Payer: Self-pay | Admitting: Internal Medicine

## 2010-06-03 ENCOUNTER — Ambulatory Visit (INDEPENDENT_AMBULATORY_CARE_PROVIDER_SITE_OTHER): Payer: MEDICARE | Admitting: Internal Medicine

## 2010-06-03 VITALS — BP 130/80 | HR 60 | Temp 98.2°F | Wt 151.0 lb

## 2010-06-03 DIAGNOSIS — R5383 Other fatigue: Secondary | ICD-10-CM

## 2010-06-03 DIAGNOSIS — I428 Other cardiomyopathies: Secondary | ICD-10-CM

## 2010-06-03 DIAGNOSIS — R5381 Other malaise: Secondary | ICD-10-CM

## 2010-06-03 DIAGNOSIS — R300 Dysuria: Secondary | ICD-10-CM

## 2010-06-03 DIAGNOSIS — I499 Cardiac arrhythmia, unspecified: Secondary | ICD-10-CM

## 2010-06-03 LAB — POCT URINALYSIS DIPSTICK
Bilirubin, UA: NEGATIVE
Blood, UA: NEGATIVE
Glucose, UA: NEGATIVE
Leukocytes, UA: NEGATIVE
Nitrite, UA: NEGATIVE

## 2010-06-03 NOTE — Patient Instructions (Signed)
Will notify you of  Culture results . Unsure why you are having   Fatigue .[name] Call if you want treatment for yeast.   CAll us if you get fever  .     Or progressive abd pain.

## 2010-06-03 NOTE — Progress Notes (Signed)
  Subjective:    Patient ID: Alisha Brown, female    DOB: 02-14-1940, 70 y.o.   MRN: [id number]  HPI Patient come in today booked  For  work in acute appt for ? uti .  Howver her sx are as above .  Really  Concerned about  Fatigue   And tired all the time  Vague and deosnt seem related to acitivity DOE and no PND   More like speepiness but no OSA hx.  Now CO of Internal itching burning.  In urinary vag area  .Woke in the night intermittent. Not a vaginal infectioin Bloating at times  ? If yeast infection.   NO treatments per de  Recently weaned the HRT as dicussed but no sig hot flushes and sleep impairment obvious   Review of Systems No change in bowel habit.  No specific cp sob fever  Change in meds bleeding bruisingNo edema .Denies OSA sx.     Objective:   Physical Exam  Physical Exam: Vital signs reviewed YQM:[id number] is a well-developed well-nourished alert cooperative  white female who appears her stated age in no acute distress.  HEENT: normocephalic  traumatic , Eyes: PERRL EOM's full, conjunctiva clear, Nares: paten,t no deformity discharge or tenderness., Ears: no deformity EAC's clear TMs with normal landmarks. Mouth: clear OP, no lesions, edema.  Moist mucous membranes. Dentition in adequate repair. NECK: supple without masses, thyromegaly or bruits. CHEST/PULM:  Clear to auscultation and percussion breath sounds equal no wheeze , rales or rhonchi. No chest wall deformities or tenderness. CV: PMI is nondisplaced, S1 S2 no gallops,ocss irreg beat , rubs. Peripheral pulses are full without delay.No JVD .  ABDOMEN: Bowel sounds normal nontender  No guard or rebound, no hepato splenomegal no CVA tenderness.  Points to Suprapubic area as area of sometimes tenderness Extremtities:  No clubbing cyanosis or edema, no acute joint swelling or redness no focal atrophy NEURO:  Oriented x3, cranial nerves 3-12 appear to be intact, no obvious focal weakness,gait within normal limits no abnormal  reflexes or asymmetrical SKIN: No acute rashes normal turgor, color, no bruising or petechiae. PSYCH: Oriented, good eye contact, no obvious  anxiety, cognition and judgment appear normal. Poss down feeling . Declined vaginal exam.       UA normal  Assessment & Plan:  Fatigue  Increase sleepiness .    doubt if from decrease in hrt as not awakening at night with this .  surapubic cramps ith vag burning decline exam doesnt think abnormal  Feels like her old utis    Will cx  For this  And rx accordingly.  Doesn't sound like vaginal atrophy sx at this time  Post menopausal.  Weaned off hrt.  Irreg Hb ? bigeminy by listening . pusle 60s and bp ok.  should follow up with cardsDr Ladona Ridgel CM and HT followed by cardiology.   Total visit > 50% spent counseling and coordinating care

## 2010-06-05 ENCOUNTER — Telehealth: Payer: Self-pay | Admitting: *Deleted

## 2010-06-05 LAB — URINE CULTURE: Colony Count: >=100000

## 2010-06-05 MED ORDER — FLUCONAZOLE 150 MG PO TABS: 150.0000 mg | ORAL_TABLET | Freq: Once | ORAL | Status: AC

## 2010-06-05 NOTE — Telephone Encounter (Signed)
Ok x 1 diflucan 150

## 2010-06-05 NOTE — Telephone Encounter (Signed)
Rx sent to pharmacy   

## 2010-06-05 NOTE — Telephone Encounter (Signed)
Pt states she was told that she could call for a refill of Diflucan when she needs it.  Please send to [facility]   States Carollee Herter knows all about this.?

## 2010-06-10 NOTE — Assessment & Plan Note (Signed)
[facility]                         ELECTROPHYSIOLOGY OFFICE NOTE   Alisha Brown, Alisha Brown                       MRN:          [id number]  DATE:06/08/2007                            DOB:          April 18, 1940    [name] returns today for followup.  She is a very pleasant 70-year-  old woman who I saw initially about two months ago secondary to  symptomatic PACs, nonsustained VT in the setting of mild LV dysfunction  by echo.  At the time of her clinic visit, she was fatigued, weak, and  did not have very much energy.  She had never had frank syncope.  Since  we saw her, we started her on verapamil 180 mg daily.  She is improved.  She notes that she still has occasional palpitations, but she did not  have as much heart racing, does not have as much fatigue and weakness,  and overall is better.   CURRENT MEDICATIONS:  1. Verapamil 180 a day.  2. Aspirin 81 a day.  3. Multivitamins.  4. Premarin 0.625 daily.   PHYSICAL EXAMINATION:  She is a pleasant, well-appearing 70 year old  woman who looks younger than her stated age.  Blood pressure 140/92, pulse 86 and irregular, respirations 18.  NECK:  No jugular venous distention.  LUNGS:  Clear bilaterally to auscultation.  No wheezes, rales or rhonchi  are present.  CARDIOVASCULAR:  Irregular rhythm with a normal S1 and S2.  ABDOMEN:  Soft, nontender, nondistended.  There is no organomegaly.  Bowel sounds are present.  There is no rebound or guarding.  EXTREMITIES:  No clubbing, cyanosis or edema.  Pulses were 2+ and  symmetric.   Her EKG demonstrates a sinus rhythm with frequent PVCs and occasional  PACs.   IMPRESSION:  1. Symptomatic ventricular as well as atrial ectopy.  2. Verapamil therapy secondary to #1.  3. Hypertension.   DISCUSSION:  Overall, the patient is stable and improved.  She is still  having some arrhythmia, but this is much less symptomatic than it was  previously.  I will plan  to see the patient back in the office in  several months for EP followup.     Doylene Canning. Ladona Ridgel, MD  Electronically Signed    GWT/MedQ  DD: 06/08/2007  DT: 06/08/2007  Job #: [id number]

## 2010-06-10 NOTE — Procedures (Signed)
Alisha Brown, Alisha Brown              ACCOUNT NO.:  [id number]   MEDICAL RECORD NO.:  [id number]          PATIENT TYPE:  OUT   LOCATION:  SLEEP CENTER                 FACILITY:  [facility]   PHYSICIAN:  Barbaraann Share, MD,FCCPDATE OF BIRTH:  07-18-1942   DATE OF STUDY:  12/01/2006                            NOCTURNAL POLYSOMNOGRAM   REFERRING PHYSICIAN:  Barbaraann Share, MD,FCCP   INDICATION FOR STUDY:  Hypersomnia with sleep apnea.   EPWORTH SLEEPINESS SCORE:  5.   MEDICATIONS:   SLEEP ARCHITECTURE:  The patient had a total sleep time of 324 minutes  with no slow wave sleep and only 65 minutes of REM.  Sleep onset latency  was mildly prolonged at 32 minutes, and REM onset was normal.  Sleep  efficiency was decreased at 76%.   RESPIRATORY DATA:  The patient was found to have 1 apnea and 6  obstructive hypopneas for an apnea/hypopnea index of 1.1 event per hour.  The events occurred primarily on her left side, and there was mild to  moderate intermittent snoring noted throughout.   OXYGEN DATA:  The patient had O2 saturation as low as 91% during her  obstructive events.   CARDIAC DATA:  The patient was known to have very frequent PVCs  throughout the whole night and at 1 point had questionable atrial  fibrillation with a controlled ventricular response.   MOVEMENT-PARASOMNIA:  None.   IMPRESSIONS-RECOMMENDATIONS:  1. Small numbers of obstructive events which do not meet the      apnea/hypopnea index criteria for the obstructive sleep apnea      syndrome.  2. Very frequent premature ventricular contractions throughout the      night and also the possibility of atrial fibrillation at one      particular time when an irregular rhythm was noted.  Clinical      correlation suggested.      Barbaraann Share, MD,FCCP  Diplomate, American Board of Sleep  Medicine  Electronically Signed     KMC/MEDQ  D:  12/21/2006 11:58:06  T:  12/21/2006 13:31:25  Job:  [id number]

## 2010-06-10 NOTE — Assessment & Plan Note (Signed)
[facility]                         ELECTROPHYSIOLOGY OFFICE NOTE   ISHANA, BLADES                       MRN:          [id number]  DATE:04/13/2007                            DOB:          04-Feb-1940    Ms. Zaldivar is referred today by Dr. Dietrich Pates for evaluation of  nonsustained VT in the setting of mild cardiomyopathy.   HISTORY OF PRESENT ILLNESS:  The patient is a very pleasant 70 year old  woman who has moved to [place] from [place] where she had  been seen for many years with palpitations.  The patient has documented  PVCs dating back to the 1980s in review of her cardiac records.  The  patient states that she has fatigue and weakness, and these symptoms  have increased in frequency and severity over the last several months.  She feels mild palpitations.  She does not feel them real well but does  note shortness of breath, fatigue and tiredness.  Because of all of the  above symptoms and documented runs of nonsustained ventricular  tachycardia at rates up to 9 beats in a row, she was referred for  catheterization with Dr. Juanda Chance which demonstrated mild LV dysfunction  with an EF of approximately 50% and no obstructive coronary disease.  She has never had syncope.  She does note that when she was living in  [place] that she was told she had 2 episodes of ventricular tachycardia  which apparently occurred with exertion.  She denies any history of  frank syncope.  She has been on multiple medications to try to suppress  these arrhythmias.  Most of them, as best I can tell, different beta  blockers.   FAMILY HISTORY:  Notable for grandmother having cancer.   SOCIAL HISTORY:  The patient has never been a smoker.  She denies  alcohol abuse.  The patient is married.  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   Cardiac monitoring, as previously noted, demonstrated frequent episodes  of PVCs  including up to 9 beats of nonsustained VT.  2-D echo has  demonstrated moderate to mild LV dysfunction with an EF in the 40%  range, although recent catheterization demonstrated the EF of 50%.   MEDICATIONS:  1. Multivitamins and calcium.  2. Premarin aspirin 81 mg day.   REVIEW OF SYSTEMS:  Her review of systems is otherwise negative, except  as noted in the HPI.  She also has occasional problems with chronic  headaches, though these are not particularly severe, and seasonal  allergic rhinitis symptoms.   PHYSICAL EXAMINATION:  GENERAL:  A pleasant 70 year old woman in no  acute distress.  VITAL SIGNS:  The blood pressure today was 117/52, the pulse was 50 and  irregular, the respirations were 18.  The weight was 146 pounds.  HEENT:  Normocephalic and atraumatic.  Pupils equal and round.  Oropharynx moist.  Sclerae anicteric.  NECK:  No jugular venous distention.  There was no thyromegaly.  The  trachea was midline.  The carotids were 2+ and symmetric.  LUNGS:  Clear  bilaterally to auscultation.  No wheezes, rales or rhonchi  were present.  There was no increased work of breathing.  CARDIAC:  An irregular rhythm with normal S1 and S2.  I did not  appreciate any severe murmurs, rubs or gallops.  The PMI was not  enlarged nor laterally displaced.  ABDOMEN:  Soft, nontender,  nondistended.  No organomegaly.  Bowel sounds present.  No rebound or  guarding.  EXTREMITIES:  No cyanosis, clubbing or edema.  Pulses 2+ and symmetric.  NEUROLOGIC:  Alert and oriented x3 with cranial nerves intact.  Strength  5/5 and symmetric.   The EKG demonstrates sinus rhythm with frequent PVCs and borderline  prolongation of the QT interval.  It should be noted that the PVCs  demonstrate right bundle branch block, QRS pattern, with a left superior  axis.  AVR and AVL are both positive.  The transition from positive to  negative is from lead V2 to lead V3.   IMPRESSION:  1. Symptomatic palpitations  and documented nonsustained ventricular      tachycardia  2. Mild cardiomyopathy with ejection fraction varying between 40 and      50%, 50% ejection fraction being on catheterization.  3. Remote history of ventricular tachycardia according to the patient.   DISCUSSION:  The patient has a multitude of symptoms.  It is unclear to  me that her palpitations and her nonsustained VT are causing her  symptoms of fatigue and weakness, lightheadedness and dizziness, but I  think there is enough chance that these are related that we should  attempt to try and control her palpitations and nonsustained VT with a  strategy of attempting rhythm control with medical therapy.  She will  also be candidate for catheter ablation of her nonsustained VT and PVCs,  although I think there is at least a fair chance that she may actually  have an epicardial foci.  To this end, I have recommended the patient  try long-acting verapamil initially to see if we can control her  symptoms and improvement them.  If verapamil is ineffective, then  considering a drug like flecainide would also be warranted.  Finally,  ablation of her nonsustained VT and foci of PVCs would certainly also be  a reasonable thing to consider, but I have some concerns that she may  well have an epicardial foci which would not be amenable to catheter  ablation.  I will plan to see the patient back in the office in several  weeks.  Obviously, if she were to have frank syncope, she is instructed  to call us or proceed to the emergency department.  At the present time,  EP study is not indicated.     Doylene Canning. Ladona Ridgel, MD  Electronically Signed    GWT/MedQ  DD: 04/13/2007  DT: 04/14/2007  Job #: [id number]

## 2010-06-10 NOTE — Cardiovascular Report (Signed)
NAMESHARALYN, LOMBA              ACCOUNT NO.:  [id number]   MEDICAL RECORD NO.:  [id number]          PATIENT TYPE:  OIB   LOCATION:  1963                         FACILITY:  [facility]   PHYSICIAN:  Bruce R. Juanda Chance, MD, FACCDATE OF BIRTH:  October 23, 1940   DATE OF PROCEDURE:  03/30/2007  DATE OF DISCHARGE:                            CARDIAC CATHETERIZATION   CLINICAL HISTORY:  Ms. Loschiavo is 70 years old and has recently been  seen in consultation by Dr. Dietrich Pates with palpitations and presyncope.  She was evaluated with a Holter monitor which showed episodes of  nonsustained ventricular tachycardia, the longest of which was 9 beats.  She had an echocardiogram which showed a reduced ejection fraction of  40%.  For this reason, she was scheduled for evaluation with  angiography.   PROCEDURE:  The procedure is performed via the right femoral artery  using arterial sheath and 4-French preformed coronary catheters.  A  front wall arterial puncture was performed, and Omnipaque contrast was  used.  The patient tolerated the procedure well and left the laboratory  in satisfactory condition.   RESULTS:  The left main coronary was free of significant disease.   Left anterior descending artery gave rise to a large and small diagonal  branch and four septal perforators.  These and LV proper were free of  significant disease.   The circumflex artery was a small vessel gave rise to two small marginal  branches.  These vessels were free of significant disease.   The right coronary artery was a large super dominant vessel that gave  rise to conus branch, a right ventricle branch, a posterior descending  branch, and four posterolateral branches.  Two of these posterolateral  branches were quite large.  These vessels were free of significant  disease.   The left ventriculogram performed in the RAO projection showed mild  global hypokinesis.  The estimated ejection was fraction was 50%.  There  was  an area of calcification which was clearly part of the cardiac  structure which appeared to be in the mitral annulus.   CONCLUSION:  1. Normal coronary angiography.  2. Mild global hypokinesis with estimated ejection fraction of 50%.   RECOMMENDATIONS:  The patient does not have any significant coronary  artery disease.  Her LV function appears a little bit better than  echocardiography.  She does have a documented ventricular tachycardia,  and she does have symptoms of presyncope.  I discussed the situation  with Dr. Tenny Craw and will arrange EP consult as an outpatient.      Bruce Elvera Lennox Juanda Chance, MD, [facility]  Electronically Signed     BRB/MEDQ  D:  03/30/2007  T:  03/30/2007  Job:  [id number]   cc:   Pricilla Riffle, MD, [facility]  Doylene Canning. Ladona Ridgel, MD  Everardo Beals Juanda Chance, MD, [facility]  Cardiopulmonary Laboratory

## 2010-06-10 NOTE — Assessment & Plan Note (Signed)
[facility]                             PULMONARY OFFICE NOTE   Alisha Brown                       MRN:          [id number]  DATE:11/30/2006                            DOB:          [birth year]    HISTORY OF PRESENT ILLNESS:  The patient is a 70 year old female, whom I  have been asked to see for possible sleep apnea.  The patient states  that she has had some snoring and rare choking arousals.  She has been  told that she has abnormal sounds with respect to her breathing but no  obvious pauses in her breathing during sleep.  She has also been found  to have mild grinding of her teeth.  The patient states that she  typically gets to bed between 10-11 and gets up between 7-8 to start her  day and does not feel rested.  She has a morning headache.  No one has  ever told her that she does a lot of kicking during the night, and she  has no symptoms that are consistent with restless leg syndrome.  The  patient apparently had a sleep study in 1996 in [place] that was  unremarkable.  The patient currently is retired and spends time on the  computer.  She has definite sleep pressure periods of inactivity,  especially in the afternoon.  She has no major issues trying to watch TV  or moving in the evening.  She has some sleep pressure with driving long  distances; however, it is not overly severe.  Of note, the patient's  weight is up about 12 pounds over the last few years.   PAST MEDICAL HISTORY:  1. Hypertension.  2. History of irregular heart rhythm and mitral valve prolapse.  3. History of dyslipidemia.  4. History of allergic rhinitis.  5. History of chronic headaches.  6. Status post hysterectomy and tubal ligation.   CURRENT MEDICATIONS:  1. Multivitamin daily.  2. Premarin 0.625 daily.  3. Other herbal supplements.   THE PATIENT HAS INTOLERANCE TO CODEINE, TENORMIN, AND NEURONTIN.   SOCIAL HISTORY:  She is married.  She has never  smoked.   FAMILY HISTORY:  Remarkable for grandmother having breast cancer,  otherwise unremarkable.   REVIEW OF SYSTEMS:  As per history of present illness.  Also see the  patient intake form documented in the chart.   PHYSICAL EXAM:  GENERAL:  She is a well-developed in no acute distress.  Blood pressure is 116/80, pulse 72, temperature 98.2, weight 147 pounds.  She is 5 feet 5 inches tall.  O2 saturation on room air is 97%.  HEENT:  Pupils equal, round, and reactive to light and accommodation.  Extraocular muscles are intact.  Nares show mild septal deviation to the  left.  Oropharynx with very mild elongation of soft palate and uvula.  NECK:  Supple without JVD or lymphadenopathy.  There is no palpable  thyromegaly.  CHEST:  Totally clear to auscultation.  CARDIAC:  A slightly irregular rhythm, probably due to ectopics with a  2/6 systolic murmur.  ABDOMEN:  Soft, nontender  with good bowel sounds.  GENITAL/RECTAL/BREAST EXAM:  Not done and not indicated.  LOWER EXTREMITIES:  Without edema.  Pulses are intact distally.  NEUROLOGIC:  Alert and oriented with no obvious motor deficits.   IMPRESSION:  Questionable obstructive sleep apnea.  The patient does  have some subtle findings on history and exam that may predispose her to  this diagnosis.  She has nothing to suggest restless leg syndrome or  other sleep disorder.  There is really nothing to suggest a daytime  sleep disorder such as narcolepsy or idiopathic hypersomnia.   PLAN:  1. Given the patient's history and its impact on her quality of life      during the day, I would like to ahead and send her for nocturnal      polysomnography.  The patient is agreeable to this approach.  The      patient will follow up thereafter.     Barbaraann Share, MD,FCCP  Electronically Signed    K[facility]/MedQ  DD: 12/30/2006  DT: 12/30/2006  Job #: [id number]   cc:   Neta Mends. Fabian Sharp, MD

## 2010-06-10 NOTE — Assessment & Plan Note (Signed)
[facility]                            CARDIOLOGY OFFICE NOTE   Alisha Brown, Alisha Brown                       MRN:          [id number]  DATE:03/25/2007                            DOB:          December 20, 1940    IDENTIFICATION:  Alisha Brown is a 70 year old woman who I saw for the  first time back in January.   The patient has a longstanding history of PVCs and palpitations.  She  was seen in [place], [place]; I have a few records.  She has had a  cardiac catheterization by report was normal, a Myoview that was  negative.  An echocardiogram done in 2005 showed an LVEF of 50%.   When I saw the patient I recommended getting a Holter monitor as a first  step.  She wore this for 24 hours.  Her heart rhythm was sinus, rates of  54-125 beats per minute.  There were frequent PVCs, frequent couplets,  occasional triplets, and occasional nonsustained VT with a longest  episode of 9 beats.  With these findings I went ahead and scheduled an  echocardiogram in our office and this was done a couple of days ago.  It  showed mild to moderate LV dysfunction with an LVF in the 40s.  It is  difficult given the frequent ectopy.   The patient comes in today for reassessment.  She tells me about an  episode actually that occurred while she was sitting at her computer  last weekend when suddenly things began to fade.  She did not pass out.  The episode was transient.  She has not had anything like it since.   PHYSICAL EXAM:  The patient is in no distress.  Blood pressure is  120/70, pulse 76.  LUNGS:  Clear.  NECK:  JVP is normal.  CARDIAC EXAM:  Regular rate and rhythm, frequent skips.  S1, S2, no S3.  Grade 1/6 systolic murmur.  ABDOMEN:  Benign.  No hepatomegaly.  EXTREMITIES:  Good distal pulses.  No edema.   IMPRESSION:  Alisha Brown is a 70 year old woman with abnormal Holter  monitor showing frequent ectopy and nonsustained ventricular  tachycardia.  Her  echocardiogram, though difficult because of frequent  ectopy, the LVEF appears to be mildly to moderately down.  With this,  and especially with the spell she had, I think it is important,  imperative actually, that we exclude coronary artery disease as the  stimulating illness for this.  I would therefore set her up for a left  heart catheterization.  I will also be in touch with EP on where to  proceed if this is negative.  I have given her an aspirin to take.  Otherwise, activities as tolerated and will schedule for next week.   ALLERGIES:  1. CODEINE.  2. TENORMIN.  3. NEURONTIN.   MEDICATIONS:  1. Multivitamin.  2. Calcium with D.  3. Premarin 0.65.  4. MSM 1500 daily   SOCIAL HISTORY:  Married.  Does not smoke.   FAMILY HISTORY:  No history of CAD.   REVIEW OF SYSTEMS:  All systems reviewed  and negative.   PAST MEDICAL HISTORY:  1. Hypertension.  2. Dyslipidemia.  3. Allergic rhinitis.  4. Chronic headaches.  5. Question mitral valve prolapse.  Echocardiogram report done from      February 24 shows mitral valve prolapse with mild MR.     Pricilla Riffle, MD, [facility]  Electronically Signed    PVR/MedQ  DD: 03/25/2007  DT: 03/27/2007  Job #: [id number]

## 2010-06-10 NOTE — Assessment & Plan Note (Signed)
[facility]                         ELECTROPHYSIOLOGY OFFICE NOTE   ATHENIA, RYS                       MRN:          [id number]  DATE:12/05/2007                            DOB:          1940-06-14    Ms. Pinkstaff returns today for followup.  She is a very pleasant middle-  aged woman with a history of tachy palpitations and nonsustained  ventricular tachycardia all in the setting of mild LV dysfunction in the  absence of coronary artery disease.  When I saw her last about a month  ago, she was having more symptoms of palpitations and dizziness and  lightheadedness.  She did not have frank syncope.  She had been  hypertensive on metoprolol and we had switched her over to verapamil.  She did well initially, but when I saw her back in October, her symptoms  were back.  We tried increasing the dose of verapamil at that time and  she returns today for followup.  The patient had no specific complaints  otherwise.  Her spells persist.   MEDICATIONS:  1. Verapamil 240 a day.  2. Aciphex 1 tablet daily, dose unknown.  3. Aspirin 81 a day.  4. Multiple vitamins.   PHYSICAL EXAMINATION:  GENERAL:  She is a pleasant middle-aged woman in  no distress.  VITAL SIGNS:  Blood pressure was 118/80, the pulse 76 and regular,  respirations were 18, weight was 152 pounds.  NECK:  No jugular venous distention.  LUNGS:  Clear bilaterally to auscultation.  No wheezes, rales, or  rhonchi are present.  There is no increased work of breathing.  CARDIOVASCULAR:  Regular rate and rhythm.  Normal S1, S2.  ABDOMEN:  Soft, nontender.  EXTREMITIES:  No edema.   IMPRESSION:  1. 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  2. Mild left ventricular dysfunction.  3. Hypertension.   DISCUSSION:  Ms. Weilbacher is stable today but remains symptomatic.  I  have suggested that we decrease a dose of verapamil 120 a day and start  her on atenolol 25 titrating up to 50 mg daily.  I will see the patient  back in the office in followup in several weeks.     Doylene Canning. Ladona Ridgel, MD  Electronically Signed    GWT/MedQ  DD: 12/05/2007  DT: 12/06/2007  Job #: [id number]

## 2010-06-10 NOTE — Assessment & Plan Note (Signed)
[facility]                            CARDIOLOGY OFFICE NOTE   DEMITRIA, HAY                       MRN:          [id number]  DATE:02/18/2007                            DOB:          [birth year]    IDENTIFICATION:  [name] is a 70 year old woman who is referred for  evaluation of palpitations.  No fatigue.   REFERRING PHYSICIANS:  1. Neta Mends. Panosh, MD.  2. Barbaraann Share, MD.   HISTORY OF PRESENT ILLNESS:  The patient has a long history of PVCs and  irregular heartbeat.  She was seen in  [place], [place], by report Dr. Fabian Sharp has all these records.  She  actually saw an electrophysiologist there as well.  This was about 3-1/2  to 4 years ago.  She had a left heart cath.  She had a stress test and  Holter monitor.  She was placed on beta blocker which she may have had  an allergic reaction to.  She has not had followup for several years.  In November, she had a spell not dizzy, but everything kind of stopped.  She says it lasted a few seconds.  She has had about 5 spells total,  none since November.  Recently, she says she has felt more tired, more fatigued and things are  fuzzy.  She is irritable.   She walks on her fast track about 2 times per day and only can do about  10 minutes without getting short of breath.  Stops if she walks longer  with chest pain.  When she describes the pain, it is more sharp.  In the  past, she has had dull aches that occurred with and without activity.   ALLERGIES:  1. CODEINE.  2. TENORMIN.  3. NEURONTIN.   CURRENT MEDICATIONS:  1. Multivitamin.  2. Calcium with D.  3. Premarin 0.625.  4. Vitamin E.  5. MSM 1500.   PAST MEDICAL HISTORY:  1. Palpitations.  2. History of chest pain.  3. History of DJD.  4. History of reported mitral valve prolapse.   FAMILY HISTORY:  Negative for premature CAD or also for sudden death.   SOCIAL HISTORY:  The patient is retired.  She is married.  Does  not  smoke, does not drink.  Does drink 2 cups of coffee per day.   REVIEW OF SYSTEMS:  All systems reviewed.  Occasional seasonal  allergies.  History of anemia.  History of constipation.  Otherwise  negative to the above problem except as noted.   PHYSICAL EXAMINATION:  GENERAL:  The patient is currently in no  distress.  VITAL SIGNS:  Blood pressure 122/77, pulse is 76, weight 146.  HEENT:  Normocephalic, atraumatic.  EOMI, PERL.  Mucous membranes are  moist.  NECK:  JVP is normal.  No thyromegaly.  No bruits.  LUNGS:  Clear to auscultation.  No rales or wheezes.  CARDIAC:  Regular rate and rhythm with frequent skips, S1-S2.  No S3.  Grade 1/6 systolic murmur.  No obvious clicks.  ABDOMEN:  Supple, nontender.  Normal bowel sounds.  No  masses.  EXTREMITIES:  Good distal pulses throughout.  No lower extremity edema.   DIAGNOSTICS:  A 12-lead EKG shows ectopic atrial rhythm with PAC from  other areas, rate 73 beats per minute.  Also has PVCs that are right  bundle morphology.   IMPRESSION:  Alisha Brown has multiple premature ventricular contractions  and listening to her had quite a bit of ectopy.  She has some chest  pains that I am not convinced are cardiac, but I would like to get all  the outside records which reportedly Dr. Fabian Sharp has.  I would also like  to set her up for another Holter monitor.  I would not change anything  in her regimen for now, especially since she has had some sensitivities.  I did tell her to stop the vitamin E as there is no good data and  possibly some negative data.   FOLLOW UP:  I will see her back after the testing.     Pricilla Riffle, MD, [facility]  Electronically Signed    PVR/MedQ  DD: 02/18/2007  DT: 02/19/2007  Job #: [id number]   cc:   Neta Mends. Fabian Sharp, MD

## 2010-06-10 NOTE — Assessment & Plan Note (Signed)
[facility]                         ELECTROPHYSIOLOGY OFFICE NOTE   Alisha, Brown                       MRN:          [id number]  DATE:11/02/2007                            DOB:          Feb 23, 1940    [name] returns today for followup.  She is a very pleasant middle-  aged woman with a history of palpitations and mild LV dysfunction and  nonsustained VT.  When I saw her last, we had started her on verapamil  and she had much improvement on verapamil.  She previously been tried on  Tenormin and could not tolerate this medication, it is unclear what  exactly happened what  sounds like she got hypotensive on Tenormin.  The  patient returns today for followup.  She notes that she has had one  spell where she was in a grocery store and felt like she was in a fog  and like everything was sitting still.  She held onto her car.  She did  not pass out.  She did not lose consciousness.  Her vision was not  significantly changed.  The patient also notes that she is fairly  fatigued and tired.  She denies peripheral edema.  She denies any change  in her exercise capacity.  She does continue to feel palpitations and  notes since we last saw her, palpitations have increased in frequency  and severity.   MEDICATIONS:  1. Verapamil 180 a day.  2. Aspirin 81 a day.  3. Premarin.  4. Multiple vitamins.   PHYSICAL EXAMINATION:  GENERAL:  She is a pleasant well-appearing 70-  year-old woman in no acute distress.  VITAL SIGNS:  Blood pressure was 138/72, the pulse 90 and irregular,  respirations were 18, the weight was 146 pounds.  NECK:  No jugular venous distention.  LUNGS:  Clear bilaterally to auscultation.  No wheezes, rales or rhonchi  are present.  No increased work of breathing.  CARDIOVASCULAR:  Irregular rhythm with normal S1 and S2.  ABDOMEN:  Soft, nontender.  EXTREMITIES:  No edema.   IMPRESSION:  1. Symptomatic palpitations.  2.  Generalized fatigue and weakness.  3. Mild left ventricular dysfunction by 2D echo.  4. Recent spells of unclear clinical etiology.  No associated      palpitations with them.   DISCUSSION:  Ms. Doerner and I talked about apparent increased frequency  of her ventricular ectopy.  Today, her EKG demonstrates sinus rhythm  with frequent PVCs.  These have a right bundle-branch block pattern with  negative axis in aVF and positive axis in aVR and aVL.  The transition  from positive to negative in her precordial leads appears to be at lead  V4.  I have discussed the treatment options with the patient with regard  to try and to improve her ventricular ectopy.  One option would be to  switch her to a beta-blocker like metoprolol, second option  would be to increase her verapamil.  I have discussed the pros and cons  of each.  We decided to try initially increasing her verapamil from 180  a day to  240 or 270 a day.  We will see her back in the office in  several weeks.     Doylene Canning. Ladona Ridgel, MD  Electronically Signed    GWT/MedQ  DD: 11/02/2007  DT: 11/03/2007  Job #: [id number]

## 2010-06-10 NOTE — Assessment & Plan Note (Signed)
[facility]                         ELECTROPHYSIOLOGY OFFICE NOTE   SHAUNA, BODKINS                       MRN:          [id number]  DATE:01/06/2008                            DOB:          11/29/1940    Ms. Pelle returns today for followup.  She is a very pleasant middle-  aged woman with a history of nonsustained VT.  She has a history of  tachy palpitations.  She has a history of chest pain with no coronary  disease who returns today for followup.  When I saw her back in  November, she was complaining of recurrent palpitations, and because of  this we had recommended that she decrease her dose of verapamil from 180  to 120 a day and start on atenolol 25 then up to 50 mg daily.  She  returns today for followup.  She feels weak and fatigued.  She has no  energy.  She is tired.  She still has rare palpitations.   On physical exam, she is a pleasant woman in no distress.  Blood  pressure was 106/74, the pulse 70 and regular, the respirations were 18.  Weight was 150 pounds.  Lungs clear bilaterally to auscultation.  No  wheezes, rales, or rhonchi are present.  Cardiovascular exam revealed a  regular rate and rhythm.  Normal S1 and S2.  There were no murmurs,  rubs, or gallops.  The abdominal exam was soft and nontender.  Extremities reveal no edema.   Her medicines are:  1. Atenolol 50 a day.  2. Verapamil 180 a day.  3. Aricept daily.  4. Aspirin 81 a day.  5. Premarin.   IMPRESSION:  1. Nonsustained ventricular tachycardia with symptomatic palpitations.  2. Hypertension.  3. Mild left ventricular dysfunction by echo.   DISCUSSION:  Ms. Winnett is stable, but continued to have problems with  feeling fatigued and tired and weak and dizzy.  To this end, I have  asked that she decrease her atenolol to 25 a day and decrease her  verapamil in half as well and we will see how she does.  Ultimately, we  may end up discontinuing her calcium  blockers and beta-blockers  altogether and undergo a period of watchful waiting as it appears she is  symptomatic on these medicines.     Doylene Canning. Ladona Ridgel, MD  Electronically Signed    GWT/MedQ  DD: 01/06/2008  DT: 01/07/2008  Job #: [id number]

## 2010-06-15 DIAGNOSIS — R300 Dysuria: Secondary | ICD-10-CM | POA: Insufficient documentation

## 2010-06-24 NOTE — Progress Notes (Signed)
Pt aware of results and is feeling better. Then started having vaginal itching and fatigue. She tried [employer] and that has helped. She will call us if she gets worse.

## 2010-07-26 ENCOUNTER — Ambulatory Visit (INDEPENDENT_AMBULATORY_CARE_PROVIDER_SITE_OTHER): Payer: Medicare Other | Admitting: Family Medicine

## 2010-07-26 DIAGNOSIS — L0211 Cutaneous abscess of neck: Secondary | ICD-10-CM

## 2010-07-26 DIAGNOSIS — L03221 Cellulitis of neck: Secondary | ICD-10-CM

## 2010-07-26 MED ORDER — CEPHALEXIN 500 MG PO CAPS: 500.0000 mg | ORAL_CAPSULE | Freq: Two times a day (BID) | ORAL | Status: AC

## 2010-07-26 NOTE — Assessment & Plan Note (Signed)
Site of sting looks fine but is having spreading redness and apparent cellulitis.  Start abx. Reviewed supportive care and red flags that should prompt return.  Pt expressed understanding and is in agreement w/ plan.

## 2010-07-26 NOTE — Patient Instructions (Signed)
This appears to be a skin infection caused by the sting Take the Keflex as directed- take w/ food to avoid upset stomach ICE the sting to help w/ swelling and pain Take benadryl as needed for itching Call with any questions or concerns Hang in there!!!

## 2010-07-26 NOTE — Progress Notes (Signed)
  Subjective:    Patient ID: Alisha [facility], female    DOB: 08-Jun-1940, 70 y.o.   MRN: [id number]  HPI Stung by a wasp on Thursday night.  Area is painful and itchy.  Redness is spreading from site of sting on back of head down neck.  + HA.  Applied a baking soda paste and ice immediately after sting which helped.  Neck muscles are also tender.  No difficulty swallowing today.  No SOB.   Review of Systems For ROS see HPI     Objective:   Physical Exam  Constitutional: She appears well-developed and well-nourished. No distress.  Skin: Skin is warm and dry. There is erythema (extending from the site of the sting behind the L ear down neck laterally to just above clavicle).          Assessment & Plan:

## 2010-08-08 ENCOUNTER — Encounter: Payer: Self-pay | Admitting: Internal Medicine

## 2010-08-18 ENCOUNTER — Other Ambulatory Visit: Payer: Self-pay | Admitting: Internal Medicine

## 2010-08-26 ENCOUNTER — Ambulatory Visit (INDEPENDENT_AMBULATORY_CARE_PROVIDER_SITE_OTHER): Payer: Medicare Other | Admitting: Internal Medicine

## 2010-08-26 DIAGNOSIS — Z23 Encounter for immunization: Secondary | ICD-10-CM

## 2010-08-26 DIAGNOSIS — Z2911 Encounter for prophylactic immunotherapy for respiratory syncytial virus (RSV): Secondary | ICD-10-CM

## 2010-09-12 ENCOUNTER — Ambulatory Visit (INDEPENDENT_AMBULATORY_CARE_PROVIDER_SITE_OTHER): Payer: Medicare Other | Admitting: Internal Medicine

## 2010-09-12 ENCOUNTER — Encounter: Payer: Self-pay | Admitting: Internal Medicine

## 2010-09-12 VITALS — BP 119/68 | HR 60 | Ht 65.0 in | Wt 150.0 lb

## 2010-09-12 DIAGNOSIS — I10 Essential (primary) hypertension: Secondary | ICD-10-CM

## 2010-09-12 DIAGNOSIS — I472 Ventricular tachycardia, unspecified: Secondary | ICD-10-CM

## 2010-09-12 DIAGNOSIS — I499 Cardiac arrhythmia, unspecified: Secondary | ICD-10-CM

## 2010-09-12 DIAGNOSIS — I4729 Other ventricular tachycardia: Secondary | ICD-10-CM

## 2010-09-12 NOTE — Progress Notes (Signed)
HPI Alisha Brown returns today for followup. She is a pleasant middle-aged woman with a history of nonsustained VT, hypertension, and fairly significant anxiety. He also has hypertension. She denies syncope, shortness of breath, but does experience occasional palpitations. Overall her symptoms from ventricular tachycardia. We will control. She notes chest discomfort which is not related to exertion and gets better after she wakes up in the morning. Allergies  Allergen Reactions  . Codeine   . Gabapentin   . Propoxyphene N-Acetaminophen     REACTION: hives,dizzy,gi upset     Current Outpatient Prescriptions  Medication Sig Dispense Refill  . Calcium Carbonate-Vitamin D 600-400 MG-UNIT per tablet Take 1 tablet by mouth daily.        [name] esomeprazole (NEXIUM) 40 MG capsule Take 40 mg by mouth every morning before breakfast.        . estrogens, conjugated, (PREMARIN) 0.45 MG tablet Take 1 po qd  30 tablet  11  . Methylsulfonylmethane (MSM) 1000 MG CAPS Take 1,000 mg by mouth daily.        . Multiple Vitamin (MULTIVITAMIN) tablet Take 1 tablet by mouth daily.        . verapamil (COVERA HS) 180 MG (CO) 24 hr tablet Take 180 mg by mouth at bedtime.        . vitamin B-12 (CYANOCOBALAMIN) 500 MCG tablet Take 500 mcg by mouth daily.           Past Medical History  Diagnosis Date  . Allergy   . CHF (congestive heart failure)   . Hypertension   . Seizures   . Hyperlipidemia   . Headache   . Hx of colonoscopy 2005    ROS:   All systems reviewed and negative except as noted in the HPI.   Past Surgical History  Procedure Date  . Breast surgery 1990    biopsy  . Abdominal hysterectomy 1987  . Appendectomy 1987  . Tonsillectomy 1953  . Adenoidectomy      Family History  Problem Relation Age of Onset  . Diabetes Mother   . Diabetes Son   . Breast cancer      grandparent  . Arthritis      family hx  . Colon cancer Neg Hx      History   Social History  . Marital Status:  Married    Spouse Name: N/A    Number of Children: N/A  . Years of Education: N/A   Occupational History  . retired    Social History Main Topics  . Smoking status: Never Smoker   . Smokeless tobacco: Not on file  . Alcohol Use: No  . Drug Use: No  . Sexually Active: Not on file   Other Topics Concern  . Not on file   Social History Narrative   HHof 2 dog    Just  Died   Egbert Garibaldi Retired and marriedG1P1     BP 119/68  Pulse 60  Ht 5\' 5"  (1.651 m)  Wt 150 lb (68.04 kg)  BMI 24.96 kg/m2  Physical Exam:  Well appearing NAD HEENT: Unremarkable Neck:  No JVD, no thyromegally Lymphatics:  No adenopathy Back:  No CVA tenderness Lungs:  Clear HEART:  Regular rate rhythm, no murmurs, no rubs, no clicks Abd:  soft, positive bowel sounds, no organomegally, no rebound, no guarding Ext:  2 plus pulses, no edema, no cyanosis, no clubbing Skin:  No rashes no nodules Neuro:  CN II through XII intact, motor grossly intact  EKG Normal sinus rhythm  Assess/Plan:

## 2010-09-12 NOTE — Assessment & Plan Note (Signed)
Her blood pressure is well controlled today. I've asked to maintain a low-sodium diet and continue her regular exercise.

## 2010-09-12 NOTE — Assessment & Plan Note (Signed)
Her nonsustained ventricular tachycardia appears to be well-controlled. Today have asked that she stop her beta blocker. She may require more calcium channel blocker or a different beta blocker if her symptoms worsen.

## 2010-09-12 NOTE — Patient Instructions (Signed)
Your physician wants you to follow-up in: 12 months with Dr Court Joy will receive a reminder letter in the mail two months in advance. If you don't receive a letter, please call our office to schedule the follow-up appointment.   Your physician has recommended you make the following change in your medication: STOP Atenolol

## 2010-10-22 ENCOUNTER — Telehealth: Payer: Self-pay | Admitting: Gastroenterology

## 2010-10-22 ENCOUNTER — Encounter: Payer: Self-pay | Admitting: *Deleted

## 2010-10-22 NOTE — Telephone Encounter (Signed)
Pt reports she still has problems at times with break thru heartburn and chest pain with Nexium. I explained to her she needs to be followed yearly when taking a PPI and she has a hepatic cyst that was last checked  10/24/08. Pt stated understanding and will see [place] 10/24/10 at 1115am.

## 2010-10-22 NOTE — Telephone Encounter (Signed)
lmom for pt to call back. Last OV 10/18/08 for RUQ pain and belching. She has a Hx of stable Hepatic Cyst per 10/24/08, Dysphagia and GERD. She has tried Aciphex, Nexium, Prevacid and Protonix. Pt called today requesting to switch from Nexium to Dexilant.

## 2010-10-24 ENCOUNTER — Encounter: Payer: Self-pay | Admitting: Gastroenterology

## 2010-10-24 ENCOUNTER — Ambulatory Visit (INDEPENDENT_AMBULATORY_CARE_PROVIDER_SITE_OTHER): Payer: Medicare Other | Admitting: Gastroenterology

## 2010-10-24 VITALS — BP 128/80 | HR 84 | Ht 65.0 in | Wt 152.2 lb

## 2010-10-24 DIAGNOSIS — K219 Gastro-esophageal reflux disease without esophagitis: Secondary | ICD-10-CM | POA: Insufficient documentation

## 2010-10-24 MED ORDER — DEXLANSOPRAZOLE 60 MG PO CPDR: 60.0000 mg | DELAYED_RELEASE_CAPSULE | Freq: Every day | ORAL | Status: AC

## 2010-10-24 NOTE — Progress Notes (Signed)
This is a 70 year old Caucasian female with chronic recurrent GERD managed fairly well with Nexium 40 mg a day. However, she is having some breakthrough burning substernal chest pain without dysphagia or hepatobiliary complaints despite Nexium 40 mg a day. Previous workup has shown a moderate sized hiatal hernia. 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 She denies hepatobiliary or lower gastrointestinal problems. She is up-to-date on her endoscopy and colonoscopy exams.  Current Medications, Allergies, Past Medical History, Past Surgical History, Family History and Social History were reviewed in [employer] record.  Pertinent Review of Systems Negative   Physical Exam: Awake alert no acute distress appearing her stated age. Her mentation of the chest is unremarkable, and she is in a regular rhythm without murmurs gallops or rubs. There is no organomegaly, abdominal masses or tenderness. Bowel sounds are normal. I cannot appreciate stigmata of chronic liver disease. Mental status is normal. Peripheral extremities are unremarkable.    Assessment and Plan: Chronic GERD with some breakthrough symptoms. I have changed her to Dexilant 60 mg every morning with when necessary H2 blockers at bedtime as needed. She is to call in 2 weeks time for progress report. She is to continue other medications as per primary care. Encounter Diagnosis  Name Primary?  . GERD (gastroesophageal reflux disease) Yes

## 2010-10-24 NOTE — Patient Instructions (Signed)
Stop Nexium and start Dexilant samples if they work well callback for an Rx.   Acid Reflux (GERD) Acid reflux is also called gastroesophageal reflux disease (GERD). Your stomach makes acid to help digest food. Acid reflux happens when acid from your stomach goes into the tube between your mouth and stomach (esophagus). Your stomach is protected from the acid, but this tube is not. When acid gets into the tube, it may cause a burning feeling in the chest (heartburn). Besides heartburn, other health problems can happen if the acid keeps going into the tube. Some causes of acid reflux include:  Being overweight.   Smoking.   Drinking alcohol.   Eating large meals.   Eating meals and then going to bed right away.   Eating certain foods.   Increased stomach acid production.  HOME CARE  Take all medicine as told by your doctor.   You may need to:   Lose weight.   Avoid alcohol.   Quit smoking.   Do not eat big meals. It is better to eat smaller meals throughout the day.   Do not eat a meal and then nap or go to bed.   Sleep with your head higher than your stomach.   Avoid foods that bother you.   You may need more tests, or you may need to see a special doctor.  GET HELP RIGHT AWAY IF:  You have chest pain that is different than before.   You have pain that goes to your arms, jaw, or between your shoulder blades.   You throw up (vomit) blood, dark brown liquid, or your throw up looks like coffee grounds.   You have trouble swallowing.   You have trouble breathing or cannot stop coughing.   You feel dizzy or pass out.   Your skin is cool, wet, and pale.   Your medicine is not helping.  MAKE SURE YOU:   Understand these instructions.   Will watch your condition.   Will get help right away if you are not doing well or get worse.  Document Released: 07/01/2007 Document Re-Released: 04/08/2009 [facility] Patient Information 2011 [place], [place].

## 2010-11-24 ENCOUNTER — Other Ambulatory Visit: Payer: Self-pay | Admitting: Internal Medicine

## 2010-11-24 DIAGNOSIS — Z1231 Encounter for screening mammogram for malignant neoplasm of breast: Secondary | ICD-10-CM

## 2010-12-25 ENCOUNTER — Ambulatory Visit
Admission: RE | Admit: 2010-12-25 | Discharge: 2010-12-25 | Disposition: A | Discharge status: Home / Self Care | Payer: Medicare Other | Source: Ambulatory Visit | Attending: Internal Medicine | Admitting: Internal Medicine

## 2010-12-25 DIAGNOSIS — Z1231 Encounter for screening mammogram for malignant neoplasm of breast: Secondary | ICD-10-CM

## 2011-01-30 ENCOUNTER — Other Ambulatory Visit: Payer: Self-pay | Admitting: Gastroenterology

## 2011-03-26 ENCOUNTER — Telehealth: Payer: Self-pay | Admitting: Internal Medicine

## 2011-03-26 NOTE — Telephone Encounter (Signed)
Patient called stating that she need a refill on her primerin. Patient stated she has been off of this med for a month and she is not doing well. Patient would like to have the rx called into [facility] ph. [phone]. Please assist.

## 2011-03-27 MED ORDER — ESTROGENS CONJUGATED 0.45 MG PO TABS: ORAL_TABLET | ORAL | Status: DC

## 2011-03-27 NOTE — Telephone Encounter (Signed)
Pt should have enough refills. She needs to call the pharmacy and have them fill it.

## 2011-04-01 ENCOUNTER — Other Ambulatory Visit (INDEPENDENT_AMBULATORY_CARE_PROVIDER_SITE_OTHER): Payer: Medicare Other

## 2011-04-01 DIAGNOSIS — Z0000 Encounter for general adult medical examination without abnormal findings: Secondary | ICD-10-CM

## 2011-04-01 DIAGNOSIS — E785 Hyperlipidemia, unspecified: Secondary | ICD-10-CM

## 2011-04-01 LAB — HEPATIC FUNCTION PANEL
AST: 21 U/L (ref 0–37)
Albumin: 4.2 g/dL (ref 3.5–5.2)
Alkaline Phosphatase: 70 U/L (ref 39–117)
Bilirubin, Direct: 0.1 mg/dL (ref 0.0–0.3)
Total Bilirubin: 0.5 mg/dL (ref 0.3–1.2)

## 2011-04-01 LAB — CBC WITH DIFFERENTIAL/PLATELET
Basophils Absolute: 0 10*3/uL (ref 0.0–0.1)
Eosinophils Absolute: 0.1 10*3/uL (ref 0.0–0.7)
Hemoglobin: 13.3 g/dL (ref 12.0–15.0)
Lymphocytes Relative: 40.4 % (ref 12.0–46.0)
MCHC: 33.2 g/dL (ref 30.0–36.0)
Monocytes Relative: 8.2 % (ref 3.0–12.0)
Neutrophils Relative %: 48 % (ref 43.0–77.0)
Platelets: 209 10*3/uL (ref 150.0–400.0)
RDW: 13.8 % (ref 11.5–14.6)

## 2011-04-01 LAB — POCT URINALYSIS DIPSTICK
Ketones, UA: NEGATIVE
Leukocytes, UA: NEGATIVE
Protein, UA: NEGATIVE
Urobilinogen, UA: 0.2
pH, UA: 5.5

## 2011-04-01 LAB — LIPID PANEL
HDL: 76.9 mg/dL (ref >39.00)
Total CHOL/HDL Ratio: 3
Triglycerides: 60 mg/dL (ref 0.0–149.0)
VLDL: 12 mg/dL (ref 0.0–40.0)

## 2011-04-01 LAB — BASIC METABOLIC PANEL
CO2: 28 mEq/L (ref 19–32)
Calcium: 9.2 mg/dL (ref 8.4–10.5)
Creatinine, Ser: 0.9 mg/dL (ref 0.4–1.2)
GFR: 70.6 mL/min (ref >60.00)
Glucose, Bld: 86 mg/dL (ref 70–99)
Sodium: 139 mEq/L (ref 135–145)

## 2011-04-08 ENCOUNTER — Ambulatory Visit (INDEPENDENT_AMBULATORY_CARE_PROVIDER_SITE_OTHER): Payer: Medicare Other | Admitting: Internal Medicine

## 2011-04-08 ENCOUNTER — Encounter: Payer: Self-pay | Admitting: Internal Medicine

## 2011-04-08 VITALS — BP 100/60 | HR 72 | Ht 65.0 in | Wt 154.0 lb

## 2011-04-08 DIAGNOSIS — K219 Gastro-esophageal reflux disease without esophagitis: Secondary | ICD-10-CM

## 2011-04-08 DIAGNOSIS — N951 Menopausal and female climacteric states: Secondary | ICD-10-CM | POA: Insufficient documentation

## 2011-04-08 DIAGNOSIS — L299 Pruritus, unspecified: Secondary | ICD-10-CM | POA: Insufficient documentation

## 2011-04-08 DIAGNOSIS — Z79890 Hormone replacement therapy (postmenopausal): Secondary | ICD-10-CM

## 2011-04-08 DIAGNOSIS — H9319 Tinnitus, unspecified ear: Secondary | ICD-10-CM | POA: Insufficient documentation

## 2011-04-08 DIAGNOSIS — I428 Other cardiomyopathies: Secondary | ICD-10-CM

## 2011-04-08 DIAGNOSIS — G4763 Sleep related bruxism: Secondary | ICD-10-CM

## 2011-04-08 DIAGNOSIS — Z0000 Encounter for general adult medical examination without abnormal findings: Secondary | ICD-10-CM

## 2011-04-08 MED ORDER — ALPRAZOLAM 0.25 MG PO TABS: 0.2500 mg | ORAL_TABLET | Freq: Three times a day (TID) | ORAL | Status: DC | PRN

## 2011-04-08 NOTE — Progress Notes (Signed)
Subjective:    Patient ID: Alisha Brown, female    DOB: 1940/12/06, 71 y.o.   MRN: [id number]  HPI Patient comes in today for preventive visit and follow-up of medical issues. Update  history since  last visit: She stopped the estrogen for about a month because of the high cost of the prescription and $70. However then she began to have severe hot flushes turning purple severe insomnia sleep interruption and some increaseItching  A good bid   . Felt pretty miserable with this she has been back on the medicine for about 2 weeks with some improvement but still feels badly. Asks about xanax .    Dentist  Grinds teeth.   . Stopped b blocker for vivid dreams and verapamil for leg swelling . Feels better with this  GERD per dr Jarold Motto helps a lot    Hearing:  Ok  Ringing in ears.   Last check  5 years ago.   Vision:  No limitations at present .  Safety:  Has smoke detector and wears seat belts.  No firearms. No excess sun exposure. Sees dentist regularly.  Falls: bathtub in the past. Erg exercise  Advance directive :  Reviewed  Planning on getting one  Memory: Felt to be good  , no concern from her or her family.  Depression: No anhedonia unusual crying or depressive symptoms except with the HRT  Nutrition: Eats well balanced diet; adequate calcium and vitamin D. No swallowing chewiing problems.  Injury: no major injuries in the last six months.  Other healthcare providers:  Reviewed today .  Social:  Lives with husband married. Pets  Dog with bladder stones  Preventive parameters: up-to-date on colonoscopy, mammogram, immunizations. Including Tdap an  ADLS:   There are no problems or need for assistance  driving, feeding, obtaining food, dressing, toileting and bathing, managing money using phone. She is independent.    Review of Systems ROS:  GEN/ HEENT: No fever, significant weight changes sweats headaches vision problems hearing changes,except as above CV/ PULM; No  chest pain shortness of breath cough, syncope,edema  change in exercise tolerance. GI /GU: No adominal pain, vomiting, change in bowel habits. No blood in the stool. No significant GU symptoms. SKIN/HEME: ,no acute skin rashes suspicious lesions or bleeding. No lymphadenopathy, nodules, masses.  NEURO/ PSYCH:  No neurologic signs such as weakness numbness. No depression anxiety. IMM/ Allergy: No unusual infections.  Allergy .   REST of 12 system review negative except as per HPI  Past history family history social history reviewed in the electronic medical record.      Objective:   Physical Exam Physical Exam: Vital signs reviewed WUJ:WJXB is a well-developed well-nourished alert cooperative  white female who appears her stated age in no acute distress.  mildy anxious  HEENT: normocephalic atraumatic , Eyes: PERRL EOM's full, conjunctiva clear, Nares: paten,t no deformity discharge or tenderness., Ears: no deformity EAC's clear TMs with normal landmarks. Mouth: clear OP, no lesions, edema.  Moist mucous membranes. Dentition in adequate repair. NECK: supple without masses, thyromegaly or bruits. CHEST/PULM:  Clear to auscultation and percussion breath sounds equal no wheeze , rales or rhonchi. No chest wall deformities or tenderness. Breast: normal by inspection . No dimpling, discharge, masses, tenderness or discharge .  CV: PMI is nondisplaced, S1 S2 no gallops, murmurs, rubs. Peripheral pulses are full without delay.No JVD .  ABDOMEN: Bowel sounds normal nontender  No guard or rebound, no hepato splenomegal no CVA tenderness.  No  hernia. Extremtities:  No clubbing cyanosis or edema, no acute joint swelling or redness no focal atrophy NEURO:  Oriented x3, cranial nerves 3-12 appear to be intact, no obvious focal weakness,gait within normal limits no abnormal reflexes or asymmetrical SKIN: No acute rashes normal turgor, color, no bruising or petechiae.  PSYCH: Oriented, good eye contact, no  obvious depression , cognition and judgment appear normal. LN: no cervical axillary inguinal adenopathy    Lab Results  Component Value Date   WBC 5.3 04/01/2011   HGB 13.3 04/01/2011   HCT 40.1 04/01/2011   PLT 209.0 04/01/2011   GLUCOSE 86 04/01/2011   CHOL 224* 04/01/2011   TRIG 60.0 04/01/2011   HDL 76.90 04/01/2011   LDLDIRECT 137.7 04/01/2011   ALT 24 04/01/2011   AST 21 04/01/2011   NA 139 04/01/2011   K 4.1 04/01/2011   CL 103 04/01/2011   CREATININE 0.9 04/01/2011   BUN 16 04/01/2011   CO2 28 04/01/2011   TSH 1.06 04/01/2011   INR 0.9 RATIO 03/29/2007          Assessment & Plan:  Preventive Health Care Counseled regarding healthy nutrition, exercise, sleep, injury prevention, calcium vit d and healthy weight . utd  HRT   Sig sx from stopping HRT   Had surgical menopause could have made this worse. Back on med for 2 weeks continue meds and then decide on lower dose later  Will prob not tolerated total discontinuation.  Itching   Poss rel;ated to above .  Ok to do  Prn xanax temporarily Lipids some better  Continue to eat healthy  SVt stable had se of mesd none now  GERD on  nexium per GI.  To get HCPOA ad directive

## 2011-04-08 NOTE — Patient Instructions (Addendum)
I agree that most of your symptoms are from estrogen withdrawal. Continue the estrogen therapy for another month or so to see if the symptoms resolve. In we can decide on alternative medication.  Call your insurance to see if there is a less expensive estrogen supplement.  Use Xanax sparingly to avoid dependent rebound but can use this in the meantime to help with sleep and anxiety. Your laboratory studies are pretty good. Continue healthy eating exercise.   Tinnitus Sounds you hear in your ears and coming from within the ear is called tinnitus. This can be a symptom of many ear disorders. It is often associated with hearing loss.  Tinnitus can be seen with:  Infections.   Ear blockages such as wax buildup.   Meniere's disease.   Ear damage.   Inherited.   Occupational causes.  While irritating, it is not usually a threat to health. When the cause of the tinnitus is wax, infection in the middle ear, or foreign body it is easily treated. Hearing loss will usually be reversible.  TREATMENT  When treating the underlying cause does not get rid of tinnitus, it may be necessary to get rid of the unwanted sound by covering it up with more pleasant background noises. This may include music, the radio etc. There are tinnitus maskers which can be worn which produce background noise to cover up the tinnitus. Avoid all medications which tend to make tinnitus worse such as alcohol, caffeine, aspirin, and nicotine. There are many soothing background tapes such as rain, ocean, thunderstorms, etc. These soothing sounds help with sleeping or resting. Keep all follow-up appointments and referrals. This is important to identify the cause of the problem. It also helps avoid complications, impaired hearing, disability, or chronic pain. Document Released: 01/12/2005 Document Revised: 01/01/2011 Document Reviewed: 08/31/2007 [facility] Patient Information 2012 [place], [place].  Hormone Therapy At menopause,  your body begins making less estrogen and progesterone hormones. This causes the body to stop having menstrual periods. This is because estrogen and progesterone hormones control your periods and menstrual cycle. A lack of estrogen may cause symptoms such as:  Hot flushes (or hot flashes).   Vaginal dryness.   Dry skin.   Loss of sex drive.   Risk of bone loss (osteoporosis).  When this happens, you may choose to take hormone therapy to get back the estrogen lost during menopause. When the hormone estrogen is given alone, it is usually referred to as ET (Estrogen Therapy). When the hormone progestin is combined with estrogen, it is generally called HT (Hormone Therapy). This was formerly known as hormone replacement therapy (HRT). Your caregiver can help you make a decision on what will be best for you. The decision to use HT seems to change often as new studies are done. Many studies do not agree on the benefits of hormone replacement therapy. LIKELY BENEFITS OF HT INCLUDE PROTECTION FROM:  Hot Flushes (also called hot flashes) - A hot flush is a sudden feeling of heat that spreads over the face and body. The skin may redden like a blush. It is connected with sweats and sleep disturbance. Women going through menopause may have hot flushes a few times a month or several times per day depending on the woman.   Osteoporosis (bone loss)- Estrogen helps guard against bone loss. After menopause, a woman's bones slowly lose calcium and become weak and brittle. As a result, bones are more likely to break. The hip, wrist, and spine are affected most often. Hormone  therapy can help slow bone loss after menopause. Weight bearing exercise and taking calcium with vitamin D also can help prevent bone loss. There are also medications that your caregiver can prescribe that can help prevent osteoporosis.   Vaginal Dryness - Loss of estrogen causes changes in the vagina. Its lining may become thin and dry. These  changes can cause pain and bleeding during sexual intercourse. Dryness can also lead to infections. This can cause burning and itching. (Vaginal estrogen treatment can help relieve pain, itching, and dryness.)   Urinary Tract Infections are more common after menopause because of lack of estrogen. Some women also develop urinary incontinence because of low estrogen levels in the vagina and bladder.   Possible other benefits of estrogen include a positive effect on mood and short-term memory in women.  RISKS AND COMPLICATIONS  Using estrogen alone without progesterone causes the lining of the uterus to grow. This increases the risk of lining of the uterus (endometrial) cancer. Your caregiver should give another hormone called progestin if you have a uterus.   Women who take combined (estrogen and progestin) HT appear to have an increased risk of breast cancer. The risk appears to be small, but increases throughout the time that HT is taken.   Combined therapy also makes the breast tissue slightly denser which makes it harder to read mammograms (breast X-rays).   Combined, estrogen and progesterone therapy can be taken together every day, in which case there may be spotting of blood. HT therapy can be taken cyclically in which case you will have menstrual periods. Cyclically means HT is taken for a set amount of days, then not taken, then this process is repeated.   HT may increase the risk of stroke, heart attack, breast cancer and forming blood clots in your leg.   Transdermal estrogen (estrogen that is absorbed through the skin with a patch or a cream) may have more positive results with:   Cholesterol.   Blood pressure.   Blood clots.  Having the following conditions may indicate you should not have HT:  Endometrial cancer.   Liver disease.   Breast cancer.   Heart disease.   History of blood clots.   Stroke.  TREATMENT   If you choose to take HT and have a uterus, usually  estrogen and progestin are prescribed.   Your caregiver will help you decide the best way to take the medications.   Possible ways to take estrogen include:   Pills.   Patches.   Gels.   Sprays.   Vaginal estrogen cream, rings and tablets.   It is best to take the lowest dose possible that will help your symptoms and take them for the shortest period of time that you can.   Hormone therapy can help relieve some of the problems (symptoms) that affect women at menopause. Before making a decision about HT, talk to your caregiver about what is best for you. Be well informed and comfortable with your decisions.  HOME CARE INSTRUCTIONS   Follow your caregivers advice when taking the medications.   A Pap test is done to screen for cervical cancer.   The first Pap test should be done at age 24.   Between ages 27 and 18, Pap tests are repeated every 2 years.   Beginning at age 57, you are advised to have a Pap test every 3 years as long as your past 3 Pap tests have been normal.   Some women have medical problems  that increase the chance of getting cervical cancer. Talk to your caregiver about these problems. It is especially important to talk to your caregiver if a new problem develops soon after your last Pap test. In these cases, your caregiver may recommend more frequent screening and Pap tests.   The above recommendations are the same for women who have or have not gotten the vaccine for HPV (Human Papillomavirus).   If you had a hysterectomy for a problem that was not a cancer or a condition that could lead to cancer, then you no longer need Pap tests. However, even if you no longer need a Pap test, a regular exam is a good idea to make sure no other problems are starting.    If you are between ages 10 and 1, and you have had normal Pap tests going back 10 years, you no longer need Pap tests. However, even if you no longer need a Pap test, a regular exam is a good idea to make  sure no other problems are starting.    If you have had past treatment for cervical cancer or a condition that could lead to cancer, you need Pap tests and screening for cancer for at least 20 years after your treatment.   If Pap tests have been discontinued, risk factors (such as a new sexual partner) need to be re-assessed to determine if screening should be resumed.   Some women may need screenings more often if they are at high risk for cervical cancer.   Get mammograms done as per the advice of your caregiver.  SEEK IMMEDIATE MEDICAL CARE IF:  You develop abnormal vaginal bleeding.   You have pain or swelling in your legs, shortness of breath, or chest pain.   You develop dizziness or headaches.   You have lumps or changes in your breasts or armpits.   You have slurred speech.   You develop weakness or numbness of your arms or legs.   You have pain, burning, or bleeding when urinating.   You develop abdominal pain.  Document Released: 10/11/2002 Document Revised: 01/01/2011 Document Reviewed: 01/29/2010 [facility] Patient Information 2012 [place], [place].

## 2011-05-27 ENCOUNTER — Ambulatory Visit (INDEPENDENT_AMBULATORY_CARE_PROVIDER_SITE_OTHER): Payer: Medicare Other | Admitting: Internal Medicine

## 2011-05-27 ENCOUNTER — Encounter: Payer: Self-pay | Admitting: Internal Medicine

## 2011-05-27 VITALS — BP 122/80 | HR 76 | Temp 98.3°F | Wt 153.0 lb

## 2011-05-27 DIAGNOSIS — N951 Menopausal and female climacteric states: Secondary | ICD-10-CM

## 2011-05-27 DIAGNOSIS — Z79890 Hormone replacement therapy (postmenopausal): Secondary | ICD-10-CM

## 2011-05-27 DIAGNOSIS — F411 Generalized anxiety disorder: Secondary | ICD-10-CM | POA: Insufficient documentation

## 2011-05-27 MED ORDER — ALPRAZOLAM 0.25 MG PO TABS: 0.2500 mg | ORAL_TABLET | Freq: Three times a day (TID) | ORAL | Status: DC | PRN

## 2011-05-27 MED ORDER — ESTRADIOL 0.05 MG/24HR TD PTWK: 1.0000 | MEDICATED_PATCH | TRANSDERMAL | Status: DC

## 2011-05-27 NOTE — Progress Notes (Signed)
  Subjective:    Patient ID: Alisha Brown, female    DOB: 09-30-1940, 71 y.o.   MRN: [id number]  HPI Pt comesin today to discuss medication and insurance denial.   Was getting a lot better  On the .45 premarin  estrogen for the past month and slept  is a lot better  An doing better.  And then got a letter that medicare  said wont pay  cause not usig a generic  Or tiered med .    Didn't give her names of generic or  Alternatives. expect to say the patient to discover.   Anxious because she was beginning to feel so much better  Less flushing and a couple nights of better sleep noted.    Moved up appt  Using xanax as needed almost out.  Feels jittery at times  Review of Systems No cp sob   Fever  Mood less anxious  But worried about this. Past history family history social history reviewed in the electronic medical record.     Objective:   Physical Exam BP 122/80  Pulse 76  Temp(Src) 98.3 F (36.8 C) (Oral)  Wt 153 lb (69.4 kg)  SpO2 98% wdwn in nad Somewhat anxious  But cognitively intact.  No tremor    Reviewed letter and ehr.  N     Assessment & Plan:  Menopausal sx.   Better on hrt  Pt aware of risk benefit .   Anxious that she will to  be able to get med and get worse again.  Reviewed letter and discussion   No formulary list available but will guess what is covered. Begin  Patch .05  Per week   ROV   in 4-6 weeks .  Refill xanax  Get a copy of formulary and tiers  For estrogen.

## 2011-05-27 NOTE — Patient Instructions (Signed)
Get a list  Of all hormone estrogen based meds on your formulary and which tiers they are.   In the mean time we will change to patch.  ROV in 4- 6 weeks to see how you are doing.

## 2011-06-02 ENCOUNTER — Other Ambulatory Visit: Payer: Self-pay

## 2011-06-02 MED ORDER — ESTROGENS CONJUGATED 0.45 MG PO TABS: ORAL_TABLET | ORAL | Status: DC

## 2011-06-02 NOTE — Telephone Encounter (Signed)
Pt has written a note to Dr. Fabian Sharp and requested to continue using Premarin.  Ok per Dr. Fabian Sharp to fill x 3 rf at [facility]  Spoke with pt and pt is aware rx sent.

## 2011-06-09 ENCOUNTER — Ambulatory Visit: Payer: Medicare Other | Admitting: Internal Medicine

## 2011-06-10 ENCOUNTER — Telehealth: Payer: Self-pay | Admitting: Family Medicine

## 2011-06-10 NOTE — Telephone Encounter (Signed)
Not much more I can do but she should still try the patch is she is willing and we can always adjust the doses.

## 2011-06-10 NOTE — Telephone Encounter (Signed)
Patient's appeal for Premarin 0.45mg  was received by [facility] 06/05/11. Spoke with Marcelino Duster at [place] today - appeal was denied 06/08/11. Per Marcelino Duster, patient is aware.

## 2011-06-10 NOTE — Telephone Encounter (Signed)
FYI

## 2011-06-11 NOTE — Telephone Encounter (Signed)
Pt states she wants to stay with the premarin 0.45mg .  Pt states for now she is comfortable with this medication and pt will discuss this further at her next ov.

## 2011-06-23 ENCOUNTER — Ambulatory Visit: Payer: Medicare Other | Admitting: Internal Medicine

## 2011-08-18 ENCOUNTER — Other Ambulatory Visit: Payer: Self-pay | Admitting: Dermatology

## 2011-09-25 ENCOUNTER — Telehealth: Payer: Self-pay | Admitting: Internal Medicine

## 2011-09-25 ENCOUNTER — Ambulatory Visit (INDEPENDENT_AMBULATORY_CARE_PROVIDER_SITE_OTHER): Payer: Medicare Other | Admitting: Family Medicine

## 2011-09-25 VITALS — BP 145/86 | HR 84 | Temp 97.8°F | Resp 17 | Ht 66.0 in | Wt 148.0 lb

## 2011-09-25 DIAGNOSIS — Z23 Encounter for immunization: Secondary | ICD-10-CM

## 2011-09-25 DIAGNOSIS — S61019A Laceration without foreign body of unspecified thumb without damage to nail, initial encounter: Secondary | ICD-10-CM

## 2011-09-25 DIAGNOSIS — M79645 Pain in left finger(s): Secondary | ICD-10-CM

## 2011-09-25 DIAGNOSIS — S61209A Unspecified open wound of unspecified finger without damage to nail, initial encounter: Secondary | ICD-10-CM

## 2011-09-25 DIAGNOSIS — M79609 Pain in unspecified limb: Secondary | ICD-10-CM

## 2011-09-25 NOTE — Progress Notes (Signed)
Subjective:    Patient ID: Alisha Brown, female    DOB: 1940/07/09, 71 y.o.   MRN: [id number]  HPI This 71 y.o. female presents for evaluation of thumb laceration. Digging on picture frame with utility knife and knife slipped.  Knife hit L thumb; large amount of bleeding.  Now has stopped bleeding.  Distal thumb.  Hurts but better.  Last Tetanus 2005.  Full ROM of thumb; deep cut.  No numbness or tingling.  No weakness of thumb; full flexion and extension.  Takes ASA daily.   Review of Systems  Constitutional: Negative for fever and chills.  Skin: Positive for wound. Negative for rash.  Neurological: Negative for weakness and numbness.    Past Medical History  Diagnosis Date  . Allergy   . CHF (congestive heart failure)   . Hypertension   . Seizures   . Hyperlipidemia   . Headache   . Esophageal reflux   . Hiatal hernia     Past Surgical History  Procedure Date  . Breast surgery 1990    biopsy  . Abdominal hysterectomy 1987  . Appendectomy 1987  . Tonsillectomy 1953  . Adenoidectomy     Prior to Admission medications   Medication Sig Start Date End Date Taking? Authorizing Provider  ALPRAZolam (XANAX) 0.25 MG tablet Take 1 tablet (0.25 mg total) by mouth 3 (three) times daily as needed for anxiety. 05/27/11 05/26/12 Yes Madelin Headings, MD  aspirin 81 MG chewable tablet Chew 81 mg by mouth daily.   Yes Historical Provider, MD  Calcium Carbonate-Vitamin D 600-400 MG-UNIT per tablet Take 1 tablet by mouth daily.     Yes Historical Provider, MD  estrogens, conjugated, (PREMARIN) 0.45 MG tablet Take 1 po qd 06/02/11  Yes Madelin Headings, MD  Methylsulfonylmethane (MSM) 1000 MG CAPS Take 1,000 mg by mouth daily.     Yes Historical Provider, MD  Multiple Vitamin (MULTIVITAMIN) tablet Take 1 tablet by mouth daily.     Yes Historical Provider, MD  NEXIUM 40 MG capsule TAKE ONE CAPSULE BY MOUTH DAILY 30 MINUTES BEFORE A MEAL 01/30/11  Yes Mardella Layman, MD  vitamin B-12  (CYANOCOBALAMIN) 500 MCG tablet Take 500 mcg by mouth daily.     Yes Historical Provider, MD  estradiol (CLIMARA - DOSED IN MG/24 HR) 0.05 mg/24hr Place 1 patch (0.05 mg total) onto the skin once a week. 05/27/11 05/26/12  Madelin Headings, MD    Allergies  Allergen Reactions  . Codeine   . Gabapentin   . Propoxyphene-Acetaminophen     REACTION: hives,dizzy,gi upset    History   Social History  . Marital Status: Married    Spouse Name: N/A    Number of Children: 1  . Years of Education: N/A   Occupational History  . retired    Social History Main Topics  . Smoking status: Never Smoker   . Smokeless tobacco: Never Used  . Alcohol Use: No  . Drug Use: No  . Sexually Active: Not on file   Other Topics Concern  . Not on file   Social History Narrative   HHof 2 dog    Just  Died   Egbert Garibaldi Retired and [id number] with health issues  Cooks for him bladder stones    Family History  Problem Relation Age of Onset  . Diabetes Mother   . Diabetes Son   . Breast cancer    . Arthritis    . Colon cancer Neg Hx  Objective:   Physical Exam  Constitutional: She is oriented to person, place, and time. She appears well-developed and well-nourished. No distress.  HENT:  Head: Normocephalic and atraumatic.  Eyes: Conjunctivae and EOM are normal. Pupils are equal, round, and reactive to light.  Cardiovascular: Intact distal pulses.        CAPILLARY REFILL < 3 SECONDS L THUMB.  Musculoskeletal: Normal range of motion.       L THUMB: FULL EXTENSION/FLEXION THUMB; MOTOR 5/5.  Neurological: She is alert and oriented to person, place, and time.       SENSATION INTACT DISTAL THUMB.  Skin:       L THUMB: LACERATION DISTAL THUMB ALONG FAT PAD; X 2 MM LACERATION WITH GOOD APPROXIMATION.  WITH EXPLORATION, BRISK BLEEDING.      Assessment & Plan:   1. Laceration of thumb  Tdap vaccine greater than or equal to 7yo IM  2. Pain of left thumb    3.  TDAP   1.  Pain L thumb: New.   Secondary to laceration; continue Tylenol or Ibuprofen PRN. 2.  Laceration of L thumb: New.  S/p suture repair with TDAP.  Local wound care reviewed in detail.

## 2011-09-25 NOTE — Telephone Encounter (Signed)
Per Dr. Fabian Sharp called pt and advised to go to [facility].  Pt is aware.

## 2011-09-25 NOTE — Telephone Encounter (Signed)
L thumb was cut with utility knife  (backside of thumb). Pt states that it is about an inch long an a 1/4 inch deep. Pt currently had finger taped up to keep it from bleeding. Pt stated if she takes off the tape and bends her thumb it will start bleeding again. Pt also stated that her thumb is turning purple around cut. Pt would like to know if she should be seen or if she needs to go else where. Please contact pt

## 2011-09-25 NOTE — Progress Notes (Signed)
   Patient ID: Challis Crill MRN: [id number], DOB: [birth year], 71 y.o. Date of Encounter: 09/25/2011, 3:51 PM   PROCEDURE NOTE: Verbal consent obtained. Sterile technique employed. Numbing: Anesthesia obtained with 2% plain lidocaine 3cc for local.  Cleansed with soap and water. Irrigated. Betadine prep per usual protocol.  Wound explored, no deep structures involved, no foreign bodies.   Wound repaired with # 6 simple interrupted sutures of 5-0 Prolene.  Hemostasis obtained. Wound cleansed and dressed.  Wound care instructions including precautions covered with patient. Handout given.  Anticipate suture removal in 10 days.   SignedEula Listen, PA-C 09/25/2011 3:51 PM

## 2011-10-05 ENCOUNTER — Ambulatory Visit (INDEPENDENT_AMBULATORY_CARE_PROVIDER_SITE_OTHER): Payer: Medicare Other | Admitting: Family Medicine

## 2011-10-05 VITALS — BP 138/82 | HR 82 | Temp 98.1°F | Resp 18 | Wt 149.0 lb

## 2011-10-05 DIAGNOSIS — IMO0002 Reserved for concepts with insufficient information to code with codable children: Secondary | ICD-10-CM

## 2011-10-05 DIAGNOSIS — Z4802 Encounter for removal of sutures: Secondary | ICD-10-CM

## 2011-10-05 NOTE — Progress Notes (Signed)
[facility] [address] [place] [address] [phone]  Date:  10/05/2011   Name:  Alisha Brown   DOB:  06/26/1940   MRN:  [id number]  PCP:  Lorretta Harp, MD    Chief Complaint: Suture / Staple Removal   History of Present Illness:  Alisha Brown is a 71 y.o. very pleasant female patient who presents with the following:  Cut her left thumb while working on a picture frame on 09/25/11.  #6 SI sutures placed.    Patient Active Problem List  Diagnosis  . HYPERLIPIDEMIA  . MORTON'S NEUROMA  . HYPERTENSION  . ALLERGIC RHINITIS  . GERD  . HIATAL HERNIA  . HEPATIC CYST  . MENOPAUSE-RELATED VASOMOTOR SYMPTOMS, HOT FLASHES  . MUSCLE CRAMPS, FOOT  . DISORDER, BONE/CARTILAGE NOS  . SEIZURE DISORDER  . HYPERSOMNIA UNSPECIFIED  . FATIGUE  . CARDIAC MURMUR  . OTHER DYSPHAGIA  . HYPERGLYCEMIA  . UTI'S, HX OF  . Cardiac dysrhythmia, unspecified  . Other primary cardiomyopathies  . Visit for preventive health examination  . Dysuria  . Cellulitis and abscess of neck  . GERD (gastroesophageal reflux disease)  . Sleep related teeth grinding  . Menopausal syndrome (hot flashes)  . Postmenopausal HRT (hormone replacement therapy)  . Itching  . Tinnitus  . Anxiety reaction    Past Medical History  Diagnosis Date  . Allergy   . CHF (congestive heart failure)   . Hypertension   . Seizures   . Hyperlipidemia   . Headache   . Esophageal reflux   . Hiatal hernia     Past Surgical History  Procedure Date  . Breast surgery 1990    biopsy  . Abdominal hysterectomy 1987  . Appendectomy 1987  . Tonsillectomy 1953  . Adenoidectomy     History  Substance Use Topics  . Smoking status: Never Smoker   . Smokeless tobacco: Never Used  . Alcohol Use: No    Family History  Problem Relation Age of Onset  . Diabetes Mother   . Diabetes Son   . Breast cancer    . Arthritis    . Colon cancer Neg Hx     Allergies  Allergen Reactions  .  Codeine   . Gabapentin   . Propoxyphene-Acetaminophen     REACTION: hives,dizzy,gi upset    Medication list has been reviewed and updated.  Current Outpatient Prescriptions on File Prior to Visit  Medication Sig Dispense Refill  . ALPRAZolam (XANAX) 0.25 MG tablet Take 1 tablet (0.25 mg total) by mouth 3 (three) times daily as needed for anxiety.  24 tablet  0  . aspirin 81 MG chewable tablet Chew 81 mg by mouth daily.      . Calcium Carbonate-Vitamin D 600-400 MG-UNIT per tablet Take 1 tablet by mouth daily.        [name] estradiol (CLIMARA - DOSED IN MG/24 HR) 0.05 mg/24hr Place 1 patch (0.05 mg total) onto the skin once a week.  4 patch  6  . estrogens, conjugated, (PREMARIN) 0.45 MG tablet Take 1 po qd  30 tablet  3  . Methylsulfonylmethane (MSM) 1000 MG CAPS Take 1,000 mg by mouth daily.        . Multiple Vitamin (MULTIVITAMIN) tablet Take 1 tablet by mouth daily.        [name] NEXIUM 40 MG capsule TAKE ONE CAPSULE BY MOUTH DAILY 30 MINUTES BEFORE A MEAL  30 capsule  6  . vitamin B-12 (CYANOCOBALAMIN)  500 MCG tablet Take 500 mcg by mouth daily.        [name] DISCONTD: verapamil (COVERA HS) 180 MG (CO) 24 hr tablet Take 180 mg by mouth at bedtime.          Review of Systems:  As per HPI- otherwise negative.   Physical Examination: Filed Vitals:   10/05/11 1309  BP: 138/82  Pulse: 82  Temp: 98.1 F (36.7 C)  Resp: 18   Filed Vitals:   10/05/11 1309  Weight: 149 lb (67.586 kg)   There is no height on file to calculate BMI. Ideal Body Weight:     GEN: WDWN, NAD, Non-toxic, Alert & Oriented x 3 HEENT: Atraumatic, Normocephalic.  Ears and Nose: No external deformity. EXTR: No clubbing/cyanosis/edema NEURO: Normal gait.  PSYCH: Normally interactive. Conversant. Not depressed or anxious appearing.  Calm demeanor.  Left thumb: healed wound.  Removed 6 SI sutures. Placed band- aid  Assessment and Plan: 1. Dressing change/suture removal      Genevieve Ritzel, MD

## 2011-10-30 NOTE — Progress Notes (Signed)
Reviewed and agree.

## 2011-11-16 ENCOUNTER — Other Ambulatory Visit: Payer: Self-pay | Admitting: Internal Medicine

## 2011-11-16 DIAGNOSIS — Z1231 Encounter for screening mammogram for malignant neoplasm of breast: Secondary | ICD-10-CM

## 2011-11-25 ENCOUNTER — Other Ambulatory Visit: Payer: Self-pay | Admitting: Family Medicine

## 2011-11-25 ENCOUNTER — Other Ambulatory Visit: Payer: Self-pay | Admitting: Internal Medicine

## 2011-11-25 ENCOUNTER — Telehealth: Payer: Self-pay | Admitting: Gastroenterology

## 2011-11-25 MED ORDER — ESTROGENS CONJUGATED 0.45 MG PO TABS: ORAL_TABLET | ORAL | Status: DC

## 2011-11-25 NOTE — Telephone Encounter (Signed)
Pt. Says she has to change pharm. b/c of insurance company. She needs her premerin called into [facility] from now on.  [phone]  Thanks!

## 2011-11-25 NOTE — Telephone Encounter (Signed)
Changed to [facility] [address].

## 2011-11-26 MED ORDER — ESOMEPRAZOLE MAGNESIUM 40 MG PO CPDR: DELAYED_RELEASE_CAPSULE | ORAL | Status: DC

## 2011-11-26 NOTE — Telephone Encounter (Signed)
Prescription sent to patient's pharmacy and pt notified to schedule an appt in the next couple of months to follow up with Dr. Jarold Motto. Pt agreed and verbalized understanding.

## 2011-12-01 ENCOUNTER — Ambulatory Visit (INDEPENDENT_AMBULATORY_CARE_PROVIDER_SITE_OTHER): Payer: Medicare Other | Admitting: Family Medicine

## 2011-12-01 DIAGNOSIS — Z23 Encounter for immunization: Secondary | ICD-10-CM

## 2011-12-15 ENCOUNTER — Encounter: Payer: Self-pay | Admitting: Gastroenterology

## 2011-12-15 ENCOUNTER — Ambulatory Visit (INDEPENDENT_AMBULATORY_CARE_PROVIDER_SITE_OTHER): Payer: Medicare Other | Admitting: Gastroenterology

## 2011-12-15 VITALS — BP 138/86 | HR 89 | Resp 98 | Ht 65.0 in | Wt 150.0 lb

## 2011-12-15 DIAGNOSIS — Z1211 Encounter for screening for malignant neoplasm of colon: Secondary | ICD-10-CM

## 2011-12-15 DIAGNOSIS — K222 Esophageal obstruction: Secondary | ICD-10-CM

## 2011-12-15 DIAGNOSIS — R1314 Dysphagia, pharyngoesophageal phase: Secondary | ICD-10-CM

## 2011-12-15 DIAGNOSIS — R1319 Other dysphagia: Secondary | ICD-10-CM

## 2011-12-15 DIAGNOSIS — R1310 Dysphagia, unspecified: Secondary | ICD-10-CM

## 2011-12-15 DIAGNOSIS — K219 Gastro-esophageal reflux disease without esophagitis: Secondary | ICD-10-CM

## 2011-12-15 MED ORDER — MOVIPREP 100 G PO SOLR: 1.0000 | Freq: Once | ORAL | Status: DC

## 2011-12-15 MED ORDER — ESOMEPRAZOLE MAGNESIUM 40 MG PO CPDR: 40.0000 mg | DELAYED_RELEASE_CAPSULE | Freq: Every day | ORAL | Status: DC

## 2011-12-15 MED ORDER — ESOMEPRAZOLE MAGNESIUM 40 MG PO CPDR
40.0000 mg | DELAYED_RELEASE_CAPSULE | Freq: Two times a day (BID) | ORAL | Status: AC
Start: 2011-12-15 — End: 2012-12-14

## 2011-12-15 NOTE — Patient Instructions (Addendum)
Please increase Nexium to twice daily. New prescription sent to your pharmacy.  You have been scheduled for an endoscopy and colonoscopy with propofol. Please follow the written instructions given to you at your visit today. Please pick up your prep at the pharmacy within the next 1-3 days. If you use inhalers (even only as needed) or a CPAP machine, please bring them with you on the day of your procedure.  Information on upper endoscopy and colonoscopy given.  _____________________________________________________________________________________________________________________  Dysphagia Swallowing problems (dysphagia) occur when solids and liquids seem to stick in your throat on the way down to your stomach, or the food takes longer to get to the stomach. Other symptoms (problems) include regurgitating (burping) up food, noises coming from the throat, chest discomfort with swallowing, and a feeling of fullness in the throat when swallowing. When blockage in the throat is complete it may be associated with drooling. CAUSES There are many causes of swallowing difficulties and the following is generalized information regarding a number of reasons for this problem. Problems with swallowing may occur because of problems with the muscles. The food cannot be propelled in the usual manner into the stomach. There may be ulcers, scar tissue, or inflammation (soreness) in the esophagus (the food tube from the mouth to the stomach) which blocks food from passing normally into the stomach. Causes of inflammation include acid reflux from the stomach into the esophagus. Inflammation can also be caused by the herpes simplex virus, Candida (yeast), radiation (as with treatment of cancer), or inflammation from medicines not taken with adequate fluids to wash them down into the stomach. There may be nerve problems so signals cannot be sent adequately telling the muscles of the esophagus to contract and move the food  along. Achalasia is a rare disorder of the esophagus in which muscular contractions of the esophagus are uncoordinated. Globus hystericus is a relatively common problem in young females in which there is a sense of an obstruction or difficulty in swallowing, but in which no abnormalities can be found. This problem usually improves over time with reassurance and testing to rule out other causes. DIAGNOSIS A number of tests will help your caregiver know what is the cause of your swallowing problems. These tests may include a barium swallow in which X-rays are taken while you are drinking a liquid that outlines the lining of the esophagus on X-ray. If the stomach and small bowel are also studied in this manner it is called an upper gastrointestinal exam (UGI). Endoscopy may be done in which your caregiver examines your throat, esophagus, stomach, and small bowel with a small, flexible scope. Motility studies which measure the effectiveness and coordination of the muscular contractions of the esophagus may also be done. TREATMENT The treatment of swallowing problems are many, varying from medicines to surgical treatment. The treatment varies with the type of problem found. Your caregiver will discuss your results and treatment with you. If swallowing problems are severe the long-term problems which may occur include: malnutrition, pneumonia (from food going into the breathing tubes called trachea and bronchi), and an increase in tumors (lumps) of the esophagus. SEEK IMMEDIATE MEDICAL CARE IF:  Food or another object becomes lodged in your throat or esophagus and will not move. Document Released: 01/10/2000 Document Revised: 07/14/2011 Document Reviewed: 08/31/2007 [employer] Patient Information 2013 [employer], [place]. _______________________________________________________________________________________________________________________ Esophageal Stricture The esophagus is the long, narrow tube which carries  food and liquid from the mouth to the stomach. Sometimes a part of the esophagus  becomes narrow and makes it difficult, painful, or even impossible to swallow. This is called an esophageal stricture.  CAUSES  Common causes of blockage or strictures of the esophagus are:  Exposure of the lower esophagus to the acid from the stomach may cause narrowing.  Hiatal hernia in which a small part of the stomach bulges up through the diaphragm can cause a narrowing in the bottom of the esophagus.  Scleroderma is a tissue disorder that affects the esophagus and makes swallowing difficult.  Achalasia is an absence of nerves in the lower esophagus and to the esophageal sphincter. This absence of nerves may be congenital (present since birth). This can cause irregular spasms which do not allow food and fluid through.  Strictures may develop from swallowing materials which damage the esophagus. Examples are acids or alkalis such as lye.  Schatzki's Ring is a narrow ring of non-cancerous tissue which narrows the lower esophagus. The cause of this is unknown.  Growths can block the esophagus. SYMPTOMS  Some of the problems are difficulty swallowing or pain with swallowing. DIAGNOSIS  Your caregiver often suspects this problem by taking a medical history. They will also do a physical exam. They may then take X-rays and/or perform an endoscopy. Endoscopy is an exam in which a tube like a small flexible telescope is used to look at your esophagus.  TREATMENT  One form of treatment is to dilate the narrow area. This means to stretch it.  When this is not successful, chest surgery may be required. This is a much more extensive form of treatment with a longer recovery time. Both of the above treatments make the passage of food and water into the stomach easier. They also make it easier for stomach contents to bubble back into the esophagus. Special medications may be used following the procedure to help prevent  further narrowing. Medications may be used to lower the amount of acid in the stomach juice.  SEEK IMMEDIATE MEDICAL CARE IF:   Your swallowing is becoming more painful, difficult, or you are unable to swallow.  You vomit up blood.  You develop black tarry stools.  You develop chills.  You have a fever.  You develop chest or abdominal pain.  You develop shortness of breath, feel lightheaded, or faint. Follow up with medical care as your caregiver suggests. Document Released: 09/22/2005 Document Revised: 04/06/2011 Document Reviewed: 10/29/2005 [facility] Patient Information 2013 [place], [place].

## 2011-12-15 NOTE — Progress Notes (Signed)
This is a 71 year old Caucasian female with chronic GERD.Endoscopy was last performed 5 years ago and showed an early stricture at the gastroesophageal junction.  Dilation was not performed since she was asymptomatic.  She now presents with intermittent solid food dysphagia in the upper substernal area.  She also has breakthrough acid reflux symptoms requiring Zantac at bedtime.  She takes Nexium 40 mg every morning.  There are no lower gastrointestinal or hepatobiliary symptoms.  She otherwise is in good health, but does take aspirin 81 mg a day prophylactically.  She denies anorexia, weight loss, food intolerances, previous hepatitis or pancreatitis.  Last colonoscopy was in [place] approximately 10 years ago.  Current Medications, Allergies, Past Medical History, Past Surgical History, Family History and Social History were reviewed in [employer] record.  Pertinent Review of Systems Negative   Physical Exam: Blood pressure 138/86, pulse 89 and regular, weight 150 and BMI 29.96.  I cannot appreciate stigmata of chronic liver disease.  Examination oral pharyngeal area is unremarkable.  There is no thyromegaly or lymphadenopathy.  Chest is clear and she is in a regular rhythm without murmurs gallops or rubs.  I cannot appreciate hepatosplenomegaly, abdominal masses or tenderness.  Bowel sounds are normal.  Peripheral extremities are unremarkable mental status is normal.  Rectal exam is deferred.     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  I have increased her Nexium to 40 mg twice a day with continued when necessary Zantac use pending endoscopy and dilatation.  She also scheduled for followup colonoscopy for her 10 year screening exam..  The risk and benefits of this procedure have been explained in detail, and she is agreed to proceed as planned. No diagnosis found.

## 2011-12-28 ENCOUNTER — Ambulatory Visit
Admission: RE | Admit: 2011-12-28 | Discharge: 2011-12-28 | Disposition: A | Discharge status: Home / Self Care | Payer: Medicare Other | Source: Ambulatory Visit | Attending: Internal Medicine | Admitting: Internal Medicine

## 2011-12-28 ENCOUNTER — Ambulatory Visit: Payer: Medicare Other

## 2011-12-28 DIAGNOSIS — Z1231 Encounter for screening mammogram for malignant neoplasm of breast: Secondary | ICD-10-CM

## 2012-01-13 ENCOUNTER — Other Ambulatory Visit: Payer: Self-pay | Admitting: Internal Medicine

## 2012-01-13 DIAGNOSIS — R928 Other abnormal and inconclusive findings on diagnostic imaging of breast: Secondary | ICD-10-CM

## 2012-01-25 ENCOUNTER — Ambulatory Visit (AMBULATORY_SURGERY_CENTER): Payer: Medicare Other | Admitting: Gastroenterology

## 2012-01-25 ENCOUNTER — Encounter: Payer: Self-pay | Admitting: Gastroenterology

## 2012-01-25 VITALS — BP 116/67 | HR 67 | Temp 97.4°F | Resp 20 | Ht 65.0 in | Wt 150.0 lb

## 2012-01-25 DIAGNOSIS — K5730 Diverticulosis of large intestine without perforation or abscess without bleeding: Secondary | ICD-10-CM

## 2012-01-25 DIAGNOSIS — K219 Gastro-esophageal reflux disease without esophagitis: Secondary | ICD-10-CM

## 2012-01-25 DIAGNOSIS — R1314 Dysphagia, pharyngoesophageal phase: Secondary | ICD-10-CM

## 2012-01-25 DIAGNOSIS — Z1211 Encounter for screening for malignant neoplasm of colon: Secondary | ICD-10-CM

## 2012-01-25 DIAGNOSIS — K222 Esophageal obstruction: Secondary | ICD-10-CM

## 2012-01-25 MED ORDER — SODIUM CHLORIDE 0.9 % IV SOLN: 500.0000 mL | INTRAVENOUS | Status: DC

## 2012-01-25 NOTE — Progress Notes (Signed)
Monitor reveals SR with frequent PVCs and couplets. Pt asymptomatic. Pt verifies that this is a chronic arrythmia and is consistent with history.

## 2012-01-25 NOTE — Patient Instructions (Addendum)
Discharge instructions given with verbal understanding. Handouts on diverticulosis and a dilatation diet given. Resume previous medications. YOU HAD AN ENDOSCOPIC PROCEDURE TODAY AT THE [facility]: Refer to the procedure report that was given to you for any specific questions about what was found during the examination.  If the procedure report does not answer your questions, please call your gastroenterologist to clarify.  If you requested that your care partner not be given the details of your procedure findings, then the procedure report has been included in a sealed envelope for you to review at your convenience later.  YOU SHOULD EXPECT: Some feelings of bloating in the abdomen. Passage of more gas than usual.  Walking can help get rid of the air that was put into your GI tract during the procedure and reduce the bloating. If you had a lower endoscopy (such as a colonoscopy or flexible sigmoidoscopy) you may notice spotting of blood in your stool or on the toilet paper. If you underwent a bowel prep for your procedure, then you may not have a normal bowel movement for a few days.  DIET: Your first meal following the procedure should be a light meal and then it is ok to progress to your normal diet.  A half-sandwich or bowl of soup is an example of a good first meal.  Heavy or fried foods are harder to digest and may make you feel nauseous or bloated.  Likewise meals heavy in dairy and vegetables can cause extra gas to form and this can also increase the bloating.  Drink plenty of fluids but you should avoid alcoholic beverages for 24 hours.  ACTIVITY: Your care partner should take you home directly after the procedure.  You should plan to take it easy, moving slowly for the rest of the day.  You can resume normal activity the day after the procedure however you should NOT DRIVE or use heavy machinery for 24 hours (because of the sedation medicines used during the test).    SYMPTOMS TO  REPORT IMMEDIATELY: A gastroenterologist can be reached at any hour.  During normal business hours, 8:30 AM to 5:00 PM Monday through Friday, call [phone].  After hours and on weekends, please call the GI answering service at [phone] who will take a message and have the physician on call contact you.   Following lower endoscopy (colonoscopy or flexible sigmoidoscopy):  Excessive amounts of blood in the stool  Significant tenderness or worsening of abdominal pains  Swelling of the abdomen that is new, acute  Fever of 100F or higher  Following upper endoscopy (EGD)  Vomiting of blood or coffee ground material  New chest pain or pain under the shoulder blades  Painful or persistently difficult swallowing  New shortness of breath  Fever of 100F or higher  Black, tarry-looking stools  FOLLOW UP: If any biopsies were taken you will be contacted by phone or by letter within the next 1-3 weeks.  Call your gastroenterologist if you have not heard about the biopsies in 3 weeks.  Our staff will call the home number listed on your records the next business day following your procedure to check on you and address any questions or concerns that you may have at that time regarding the information given to you following your procedure. This is a courtesy call and so if there is no answer at the home number and we have not heard from you through the emergency physician on call, we will assume  that you have returned to your regular daily activities without incident.  SIGNATURES/CONFIDENTIALITY: You and/or your care partner have signed paperwork which will be entered into your electronic medical record.  These signatures attest to the fact that that the information above on your After Visit Summary has been reviewed and is understood.  Full responsibility of the confidentiality of this discharge information lies with you and/or your care-partner.

## 2012-01-25 NOTE — Op Note (Signed)
[facility] [address] [place], [address]   ENDOSCOPY PROCEDURE REPORT  PATIENT: Alisha Brown, Alisha Brown  MR#: [id number] BIRTHDATE: 08/22/1940 , 71  yrs. old GENDER: Female ENDOSCOPIST:David Hale Bogus, MD, [facility] REFERRED BY: PROCEDURE DATE:  01/25/2012 PROCEDURE:   EGD, diagnostic and Maloney dilation of esophagus ASA CLASS:    Class III INDICATIONS: Dysphagia and history of esophageal reflux. MEDICATION: There was residual sedation effect present from prior procedure and Propofol (Diprivan) 110 mg IV TOPICAL ANESTHETIC:   Cetacaine Spray  DESCRIPTION OF PROCEDURE:   After the risks and benefits of the procedure were explained, informed consent was obtained.  The FUSE Demo Scope  endoscope was introduced through the mouth  and advanced to the second portion of the duodenum .  The instrument was slowly withdrawn as the mucosa was fully examined.      The upper, middle and distal third of the esophagus were carefully inspected and no abnormalities were noted.  The z-line was well seen at the GEJ.  The endoscope was pushed into the fundus which was normal including a retroflexed view.  The antrum, gastric body, first and second part of the duodenum were unremarkable.  ESOPHAGUS: A medium sized hiatal hernia was noted.   A stricture was found at the gastroesophageal junction.    Retroflexed views revealed a hiatal hernia. Dilated #62F Maloney dilator...no heme or pain...   The scope was then withdrawn from the patient and the procedure completed.  COMPLICATIONS: There were no complications.   ENDOSCOPIC IMPRESSION: 1.   Normal EGD...no gastritis noted 2.   Medium sized hiatal hernia 3.   Stricture was found at the gastroesophageal junction ...dilated without difficulty  RECOMMENDATIONS: 1.  Continue PPI 2.  Dilatations PRN    _______________________________ eSigned:  Mardella Layman, MD, [facility] 01/25/2012 2:19 PM   standard discharge

## 2012-01-25 NOTE — Progress Notes (Signed)
Patient did not experience any of the following events: a burn prior to discharge; a fall within the facility; wrong site/side/patient/procedure/implant event; or a hospital transfer or hospital admission upon discharge from the facility. (G8907) Patient did not have preoperative order for IV antibiotic SSI prophylaxis. (G8918)  

## 2012-01-25 NOTE — Progress Notes (Signed)
Called to room to assist during endoscopic procedure.  Patient ID and intended procedure confirmed with present staff. Received instructions for my participation in the procedure from the performing physician.  

## 2012-01-25 NOTE — Op Note (Signed)
[facility] [address] [place], [address]   COLONOSCOPY PROCEDURE REPORT  PATIENT: Alisha, Brown  MR#: [id number] BIRTHDATE: 27-Jan-1940 , 71  yrs. old GENDER: Female ENDOSCOPIST: Mardella Layman, MD, [facility] REFERRED BY: PROCEDURE DATE:  01/25/2012 PROCEDURE:   Colonoscopy, screening ASA CLASS:   Class III INDICATIONS:Average risk patient for colon cancer. MEDICATIONS: Propofol (Diprivan) 240 mg IV  DESCRIPTION OF PROCEDURE:   After the risks and benefits and of the procedure were explained, informed consent was obtained.  A digital rectal exam revealed no abnormalities of the rectum.    The Fuse-Demo-Scope and LB [id number]-[id number]  endoscope was introduced through the anus and advanced to the cecum, which was identified by both the appendix and ileocecal valve .  The quality of the prep was excellent, using MoviPrep .  The instrument was then slowly withdrawn as the colon was fully examined.     COLON FINDINGS: There was severe diverticulosis noted in the descending colon and sigmoid colon with associated petechiae, muscular hypertrophy and tortuosity.  No bleeding was noted from the diverticulosis.   A normal appearing cecum, ileocecal valve, and appendiceal orifice were identified.  The ascending, hepatic flexure, transverse, splenic flexure, descending, sigmoid colon and rectum appeared unremarkable.  No polyps or cancers were seen. Retroflexed views revealed no abnormalities.     The scope was then withdrawn from the patient and the procedure completed.  COMPLICATIONS: There were no complications. ENDOSCOPIC IMPRESSION: 1.   There was severe diverticulosis noted in the descending colon and sigmoid colon 2.   Normal colon ...no polyps noted  RECOMMENDATIONS: 1.  High fiber diet with liberal fluid intake. 2.  Continue current colorectal screening recommendations for "routine risk" patients with a repeat colonoscopy in 10 years.   REPEAT  EXAM:  cc:  _______________________________ eSignedMardella Layman, MD, [facility] 01/25/2012 2:06 PM

## 2012-01-26 ENCOUNTER — Telehealth: Payer: Self-pay

## 2012-01-26 ENCOUNTER — Encounter: Payer: Self-pay | Admitting: Gastroenterology

## 2012-01-26 NOTE — Telephone Encounter (Signed)
  Follow up Call-  Call back number 01/25/2012  Post procedure Call Back phone  # [phone]  Permission to leave phone message Yes     Patient questions:  Do you have a fever, pain , or abdominal swelling? no Pain Score  0 *  Have you tolerated food without any problems? yes  Have you been able to return to your normal activities? yes  Do you have any questions about your discharge instructions: Diet   no Medications  no Follow up visit  no  Do you have questions or concerns about your Care? no  Actions: * If pain score is 4 or above: No action needed, pain <4.  Per the pt she does still have some abdominal pressure which she said she felt was gas.  I encouraged her to continue to pass the gas until gone.  She also c/o scratchy sore throat.  She say it did feel some better this am.  I advised her this was common post-op esophageal dilatation.  She could trythe OTC lozengers and warm water gargle.  To report if sx do not improve. Maw

## 2012-02-02 ENCOUNTER — Ambulatory Visit
Admission: RE | Admit: 2012-02-02 | Discharge: 2012-02-02 | Disposition: A | Discharge status: Home / Self Care | Payer: Medicare Other | Source: Ambulatory Visit | Attending: Internal Medicine | Admitting: Internal Medicine

## 2012-02-02 DIAGNOSIS — R928 Other abnormal and inconclusive findings on diagnostic imaging of breast: Secondary | ICD-10-CM

## 2012-03-14 ENCOUNTER — Ambulatory Visit (INDEPENDENT_AMBULATORY_CARE_PROVIDER_SITE_OTHER): Payer: Medicare Other | Admitting: Internal Medicine

## 2012-03-14 ENCOUNTER — Encounter: Payer: Self-pay | Admitting: Internal Medicine

## 2012-03-14 VITALS — BP 144/88 | HR 106 | Temp 98.0°F | Wt 149.0 lb

## 2012-03-14 DIAGNOSIS — Z79890 Hormone replacement therapy (postmenopausal): Secondary | ICD-10-CM

## 2012-03-14 DIAGNOSIS — J329 Chronic sinusitis, unspecified: Secondary | ICD-10-CM

## 2012-03-14 DIAGNOSIS — N951 Menopausal and female climacteric states: Secondary | ICD-10-CM

## 2012-03-14 DIAGNOSIS — H539 Unspecified visual disturbance: Secondary | ICD-10-CM

## 2012-03-14 MED ORDER — AMOXICILLIN 500 MG PO CAPS: 500.0000 mg | ORAL_CAPSULE | Freq: Three times a day (TID) | ORAL | Status: DC

## 2012-03-14 NOTE — Patient Instructions (Addendum)
This acts like a  Viral illnesses with possible sinusitis.      Would avoid   Decongestants for now oral  But can try nasal tray   .  Decongestants    To open up the  Face pressure   And possible sinus infection. Can add  Antibiotic if  Face pain is getting worse,     We should get neurology opinion  about the eye sx  Concern this could be an ocular migraine of vascular sx and the estrogen may put you ar risk of stroke. In the meantime wean does  To every other day and every 3rd day .       Sinusitis Sinusitis is redness, soreness, and swelling (inflammation) of the paranasal sinuses. Paranasal sinuses are air pockets within the bones of your face (beneath the eyes, the middle of the forehead, or above the eyes). In healthy paranasal sinuses, mucus is able to drain out, and air is able to circulate through them by way of your nose. However, when your paranasal sinuses are inflamed, mucus and air can become trapped. This can allow bacteria and other germs to grow and cause infection. Sinusitis can develop quickly and last only a short time (acute) or continue over a long period (chronic). Sinusitis that lasts for more than 12 weeks is considered chronic.  CAUSES  Causes of sinusitis include:  Allergies.  Structural abnormalities, such as displacement of the cartilage that separates your nostrils (deviated septum), which can decrease the air flow through your nose and sinuses and affect sinus drainage.  Functional abnormalities, such as when the small hairs (cilia) that line your sinuses and help remove mucus do not work properly or are not present. SYMPTOMS  Symptoms of acute and chronic sinusitis are the same. The primary symptoms are pain and pressure around the affected sinuses. Other symptoms include:  Upper toothache.  Earache.  Headache.  Bad breath.  Decreased sense of smell and taste.  A cough, which worsens when you are lying flat.  Fatigue.  Fever.  Thick drainage from  your nose, which often is green and may contain pus (purulent).  Swelling and warmth over the affected sinuses. DIAGNOSIS  Your caregiver will perform a physical exam. During the exam, your caregiver may:  Look in your nose for signs of abnormal growths in your nostrils (nasal polyps).  Tap over the affected sinus to check for signs of infection.  View the inside of your sinuses (endoscopy) with a special imaging device with a light attached (endoscope), which is inserted into your sinuses. If your caregiver suspects that you have chronic sinusitis, one or more of the following tests may be recommended:  Allergy tests.  Nasal culture A sample of mucus is taken from your nose and sent to a lab and screened for bacteria.  Nasal cytology A sample of mucus is taken from your nose and examined by your caregiver to determine if your sinusitis is related to an allergy. TREATMENT  Most cases of acute sinusitis are related to a viral infection and will resolve on their own within 10 days. Sometimes medicines are prescribed to help relieve symptoms (pain medicine, decongestants, nasal steroid sprays, or saline sprays).  However, for sinusitis related to a bacterial infection, your caregiver will prescribe antibiotic medicines. These are medicines that will help kill the bacteria causing the infection.  Rarely, sinusitis is caused by a fungal infection. In theses cases, your caregiver will prescribe antifungal medicine. For some cases of chronic sinusitis,  surgery is needed. Generally, these are cases in which sinusitis recurs more than 3 times per year, despite other treatments. HOME CARE INSTRUCTIONS   Drink plenty of water. Water helps thin the mucus so your sinuses can drain more easily.  Use a humidifier.  Inhale steam 3 to 4 times a day (for example, sit in the bathroom with the shower running).  Apply a warm, moist washcloth to your face 3 to 4 times a day, or as directed by your  caregiver.  Use saline nasal sprays to help moisten and clean your sinuses.  Take over-the-counter or prescription medicines for pain, discomfort, or fever only as directed by your caregiver. SEEK IMMEDIATE MEDICAL CARE IF:  You have increasing pain or severe headaches.  You have nausea, vomiting, or drowsiness.  You have swelling around your face.  You have vision problems.  You have a stiff neck.  You have difficulty breathing. MAKE SURE YOU:   Understand these instructions.  Will watch your condition.  Will get help right away if you are not doing well or get worse. Document Released: 01/12/2005 Document Revised: 04/06/2011 Document Reviewed: 01/27/2011 [facility] Patient Information 2013 [place], [place].

## 2012-03-14 NOTE — Progress Notes (Signed)
Chief Complaint  Patient presents with  . Chills    Started 1.5 weeks ago.  Has been treating with Mucinex but has stopped.  . Fever  . Generalized Body Aches  . Nausea  . Nasal Congestion  . Otalgia  . Cough    HPI: Patient comes in today for SDA for  new problem evaluation. Sx for over 10 days and evolving sx and now relapsing sx  Was getting better from flu with  gi se   And cramps.   Improve and then had to travel and then continuing sx.   Now chills and fever and myalgias and taking  Ibuprofen and mucines.  Face in tendern and teeth hurt at times .  ROS: See pertinent positives and negatives per HPI. No sob just face pressure  "BTW" having episodic visin changes over the last weeks to months temporary   Loss of partial vision  some  And some light headedness but  No head ache with this.  Can be either eye. No NVD   No cp sob or racing heart with this and no LOC.  No focal weakness  Past Medical History  Diagnosis Date  . CHF (congestive heart failure)   . Hypertension   . Hyperlipidemia   . Headache   . Esophageal reflux   . Hiatal hernia   . Allergy   . Anemia   . Anxiety   . Heart murmur   . Seizures     Family History  Problem Relation Age of Onset  . Diabetes Mother   . Diabetes Son   . Breast cancer    . Colon cancer Neg Hx   . Breast cancer Maternal Grandmother     History   Social History  . Marital Status: Married    Spouse Name: N/A    Number of Children: 1  . Years of Education: N/A   Occupational History  . retired    Social History Main Topics  . Smoking status: Never Smoker   . Smokeless tobacco: Never Used  . Alcohol Use: No  . Drug Use: No  . Sexually Active: None   Other Topics Concern  . None   Social History Narrative   HHof 2 dog    Just  Died   Egbert Garibaldi    Retired and married   [name]      Dog with health issues  Cooks for him bladder stones    Outpatient Encounter Prescriptions as of 03/14/2012  Medication Sig Dispense Refill   . amoxicillin (AMOXIL) 500 MG capsule Take 1 capsule (500 mg total) by mouth 3 (three) times daily.  30 capsule  0  . aspirin 81 MG chewable tablet Chew 81 mg by mouth daily.      . Calcium Carbonate-Vitamin D 600-400 MG-UNIT per tablet Take 1 tablet by mouth every other day.       . esomeprazole (NEXIUM) 40 MG capsule Take 1 capsule (40 mg total) by mouth 2 (two) times daily.  60 capsule  11  . esomeprazole (NEXIUM) 40 MG capsule Take 1 capsule (40 mg total) by mouth daily.  20 capsule  0  . estrogens, conjugated, (PREMARIN) 0.45 MG tablet Take 1 po qd  30 tablet  5  . Methylsulfonylmethane (MSM) 1000 MG CAPS Take 1,000 mg by mouth every other day.       . Multiple Vitamin (MULTIVITAMIN) tablet Take 1 tablet by mouth daily.        . vitamin B-12 (CYANOCOBALAMIN)  500 MCG tablet Take 500 mcg by mouth daily.         No facility-administered encounter medications on file as of 03/14/2012.    EXAM:  BP 144/88  Pulse 106  Temp(Src) 98 F (36.7 C) (Oral)  Wt 149 lb (67.586 kg)  BMI 24.79 kg/m2  SpO2 97%  Body mass index is 24.79 kg/(m^2).  GENERAL: vitals reviewed and listed above, alert, oriented, appears well hydrated and in no acute distress llols non toxic but sick   HEENT: atraumatic, conjunctiva  clear, no obvious abnormalities on inspection of external nose and ears  Nl tms OP : no lesion edema or exudate  Mild reythema  Face mild tenderness facia; maxillary and frontal area   NECK: no obvious masses on inspection palpation  No bruits heard   LUNGS: clear to auscultation bilaterally, no wheezes, rales or rhonchi, good air movement  CV: HRRR, no clubbing cyanosis or  peripheral edema nl cap refill   MS: moves all extremities without noticeable focal  abnormality  PSYCH: pleasant and cooperative, no obvious depression or anxiety Neuro grossly normal no nfocal  ASSESSMENT AND PLAN:  Discussed the following assessment and plan:  Sinusitis - if persistent can add antibiotic    Vision changes  episodic  - concern migrainous on hrt  advise wean to dec rsik of stroke  if migrainous  Menopausal hot flushes - on hrt   adivse try to wean .  -Patient advised to return or notify health care team  if symptoms worsen or persist or new concerns arise.  Patient Instructions  This acts like a  Viral illnesses with possible sinusitis.      Would avoid   Decongestants for now oral  But can try nasal tray   .  Decongestants    To open up the  Face pressure   And possible sinus infection. Can add  Antibiotic if  Face pain is getting worse,     We should get neurology opinion  about the eye sx  Concern this could be an ocular migraine of vascular sx and the estrogen may put you ar risk of stroke. In the meantime wean does  To every other day and every 3rd day .       Sinusitis Sinusitis is redness, soreness, and swelling (inflammation) of the paranasal sinuses. Paranasal sinuses are air pockets within the bones of your face (beneath the eyes, the middle of the forehead, or above the eyes). In healthy paranasal sinuses, mucus is able to drain out, and air is able to circulate through them by way of your nose. However, when your paranasal sinuses are inflamed, mucus and air can become trapped. This can allow bacteria and other germs to grow and cause infection. Sinusitis can develop quickly and last only a short time (acute) or continue over a long period (chronic). Sinusitis that lasts for more than 12 weeks is considered chronic.  CAUSES  Causes of sinusitis include:  Allergies.  Structural abnormalities, such as displacement of the cartilage that separates your nostrils (deviated septum), which can decrease the air flow through your nose and sinuses and affect sinus drainage.  Functional abnormalities, such as when the small hairs (cilia) that line your sinuses and help remove mucus do not work properly or are not present. SYMPTOMS  Symptoms of acute and chronic sinusitis  are the same. The primary symptoms are pain and pressure around the affected sinuses. Other symptoms include:  Upper toothache.  Earache.  Headache.  Bad breath.  Decreased sense of smell and taste.  A cough, which worsens when you are lying flat.  Fatigue.  Fever.  Thick drainage from your nose, which often is green and may contain pus (purulent).  Swelling and warmth over the affected sinuses. DIAGNOSIS  Your caregiver will perform a physical exam. During the exam, your caregiver may:  Look in your nose for signs of abnormal growths in your nostrils (nasal polyps).  Tap over the affected sinus to check for signs of infection.  View the inside of your sinuses (endoscopy) with a special imaging device with a light attached (endoscope), which is inserted into your sinuses. If your caregiver suspects that you have chronic sinusitis, one or more of the following tests may be recommended:  Allergy tests.  Nasal culture A sample of mucus is taken from your nose and sent to a lab and screened for bacteria.  Nasal cytology A sample of mucus is taken from your nose and examined by your caregiver to determine if your sinusitis is related to an allergy. TREATMENT  Most cases of acute sinusitis are related to a viral infection and will resolve on their own within 10 days. Sometimes medicines are prescribed to help relieve symptoms (pain medicine, decongestants, nasal steroid sprays, or saline sprays).  However, for sinusitis related to a bacterial infection, your caregiver will prescribe antibiotic medicines. These are medicines that will help kill the bacteria causing the infection.  Rarely, sinusitis is caused by a fungal infection. In theses cases, your caregiver will prescribe antifungal medicine. For some cases of chronic sinusitis, surgery is needed. Generally, these are cases in which sinusitis recurs more than 3 times per year, despite other treatments. HOME CARE INSTRUCTIONS    Drink plenty of water. Water helps thin the mucus so your sinuses can drain more easily.  Use a humidifier.  Inhale steam 3 to 4 times a day (for example, sit in the bathroom with the shower running).  Apply a warm, moist washcloth to your face 3 to 4 times a day, or as directed by your caregiver.  Use saline nasal sprays to help moisten and clean your sinuses.  Take over-the-counter or prescription medicines for pain, discomfort, or fever only as directed by your caregiver. SEEK IMMEDIATE MEDICAL CARE IF:  You have increasing pain or severe headaches.  You have nausea, vomiting, or drowsiness.  You have swelling around your face.  You have vision problems.  You have a stiff neck.  You have difficulty breathing. MAKE SURE YOU:   Understand these instructions.  Will watch your condition.  Will get help right away if you are not doing well or get worse. Document Released: 01/12/2005 Document Revised: 04/06/2011 Document Reviewed: 01/27/2011 [facility] Patient Information 2013 [place], [place].      Neta Mends. Panosh M.D.

## 2012-03-17 DIAGNOSIS — J329 Chronic sinusitis, unspecified: Secondary | ICD-10-CM | POA: Insufficient documentation

## 2012-03-17 DIAGNOSIS — N951 Menopausal and female climacteric states: Secondary | ICD-10-CM | POA: Insufficient documentation

## 2012-03-17 DIAGNOSIS — H539 Unspecified visual disturbance: Secondary | ICD-10-CM | POA: Insufficient documentation

## 2012-04-05 ENCOUNTER — Other Ambulatory Visit (INDEPENDENT_AMBULATORY_CARE_PROVIDER_SITE_OTHER): Payer: Medicare Other

## 2012-04-05 DIAGNOSIS — E785 Hyperlipidemia, unspecified: Secondary | ICD-10-CM

## 2012-04-05 DIAGNOSIS — Z0000 Encounter for general adult medical examination without abnormal findings: Secondary | ICD-10-CM

## 2012-04-05 LAB — BASIC METABOLIC PANEL
CO2: 29 mEq/L (ref 19–32)
Calcium: 8.9 mg/dL (ref 8.4–10.5)
GFR: 67.63 mL/min (ref >60.00)
Sodium: 138 mEq/L (ref 135–145)

## 2012-04-05 LAB — CBC WITH DIFFERENTIAL/PLATELET
Basophils Relative: 0.7 % (ref 0.0–3.0)
Eosinophils Relative: 2.5 % (ref 0.0–5.0)
Hemoglobin: 13.4 g/dL (ref 12.0–15.0)
Lymphocytes Relative: 38.8 % (ref 12.0–46.0)
Monocytes Relative: 8.2 % (ref 3.0–12.0)
Neutro Abs: 2.7 10*3/uL (ref 1.4–7.7)
RBC: 4.53 Mil/uL (ref 3.87–5.11)

## 2012-04-05 LAB — HEPATIC FUNCTION PANEL
AST: 21 U/L (ref 0–37)
Albumin: 3.9 g/dL (ref 3.5–5.2)
Alkaline Phosphatase: 67 U/L (ref 39–117)
Total Protein: 6.7 g/dL (ref 6.0–8.3)

## 2012-04-05 LAB — LDL CHOLESTEROL, DIRECT: Direct LDL: 148.8 mg/dL

## 2012-04-11 ENCOUNTER — Encounter: Payer: Self-pay | Admitting: Internal Medicine

## 2012-04-11 ENCOUNTER — Ambulatory Visit (INDEPENDENT_AMBULATORY_CARE_PROVIDER_SITE_OTHER): Payer: Medicare Other | Admitting: Internal Medicine

## 2012-04-11 VITALS — BP 140/76 | HR 68 | Temp 98.0°F | Ht 64.75 in | Wt 149.0 lb

## 2012-04-11 DIAGNOSIS — E079 Disorder of thyroid, unspecified: Secondary | ICD-10-CM

## 2012-04-11 DIAGNOSIS — N951 Menopausal and female climacteric states: Secondary | ICD-10-CM

## 2012-04-11 DIAGNOSIS — Z0000 Encounter for general adult medical examination without abnormal findings: Secondary | ICD-10-CM

## 2012-04-11 DIAGNOSIS — I499 Cardiac arrhythmia, unspecified: Secondary | ICD-10-CM

## 2012-04-11 DIAGNOSIS — H539 Unspecified visual disturbance: Secondary | ICD-10-CM

## 2012-04-11 DIAGNOSIS — I498 Other specified cardiac arrhythmias: Secondary | ICD-10-CM | POA: Insufficient documentation

## 2012-04-11 DIAGNOSIS — I428 Other cardiomyopathies: Secondary | ICD-10-CM

## 2012-04-11 DIAGNOSIS — R946 Abnormal results of thyroid function studies: Secondary | ICD-10-CM | POA: Insufficient documentation

## 2012-04-11 DIAGNOSIS — E785 Hyperlipidemia, unspecified: Secondary | ICD-10-CM | POA: Insufficient documentation

## 2012-04-11 MED ORDER — ALPRAZOLAM 0.25 MG PO TABS: 0.2500 mg | ORAL_TABLET | Freq: Three times a day (TID) | ORAL | Status: DC | PRN

## 2012-04-11 NOTE — Progress Notes (Signed)
Chief Complaint  Patient presents with  . Annual Exam    Medicare    HPI: Patient comes in today for Preventive Medicare wellness visit . No major injuries, ed visits ,hospitalizations , new medications since last visit. See note  Had laceration iand went to ed in past 6 months  No residual.   Since her last visit her sinusitis got better she has weaned the Premarin and taking an over-the-counter supplement called I cool she doesn't seem to have a side effect from that she thinks her headaches are much less and she hasn't had any recent vision spot changes. However she's had poor sleep and irritability from the estrogen withdrawal. Would like a short small amount of Xanax to use to get through this time as it helped her in the past. She sensitive to medicine and doesn't want to longer acting benzo.   Hearing: No change  occasional some ringing in ears.  Vision:  No limitations at present . Last eye check UTD  Safety:  Has smoke detector and wears seat belts.  No firearms. No excess sun exposure. Sees dentist regularly.  Falls: tripped over something in the last year    Advance directive :  Reviewed   Memory: Felt to be good  , no concern from her or her family.  Depression: No anhedonia unusual crying or depressive symptoms  Nutrition: Eats well balanced diet; adequate calcium and vitamin D. No swallowing chewing problems.  Injury: no major injuries in the last six months.  Other healthcare providers:  Reviewed today .  Social:  Lives with spouse married. No pets.   Preventive parameters: up-to-date  Reviewed   ADLS:   There are no problems or need for assistance  driving, feeding, obtaining food, dressing, toileting and bathing, managing money using phone. She is independent.  EXERCISE/ HABITS  Per week  treadmil  No tobacco    etoh   ROS:  GEN/ HEENT: No fever, significant weight changes sweats headaches vision problems hearing changes, CV/ PULM; No chest pain shortness  of breath cough, syncope,edema  change in exercise tolerance. GI /GU: No adominal pain, vomiting, change in bowel habits. No blood in the stool. No significant GU symptoms. SKIN/HEME: ,no acute skin rashes suspicious lesions or bleeding. No lymphadenopathy, nodules, masses.  NEURO/ PSYCH:  No  weakness numbness. No depression  IMM/ Allergy: No unusual infections.  Allergy .   Gets leg pains every morning this ache in pain has a few haircuts veins they're superficial no claudication. REST of 12 system review negative except as per HPI   Past Medical History  Diagnosis Date  . CHF (congestive heart failure)   . Hypertension   . Hyperlipidemia   . Headache   . Esophageal reflux   . Hiatal hernia   . Allergy   . Anemia   . Anxiety   . Heart murmur   . Seizures     Family History  Problem Relation Age of Onset  . Diabetes Mother   . Diabetes Son   . Breast cancer    . Colon cancer Neg Hx   . Breast cancer Maternal Grandmother     History   Social History  . Marital Status: Married    Spouse Name: N/A    Number of Children: 1  . Years of Education: N/A   Occupational History  . retired    Social History Main Topics  . Smoking status: Never Smoker   . Smokeless tobacco: Never Used  .  Alcohol Use: No  . Drug Use: No  . Sexually Active: None   Other Topics Concern  . None   Social History Narrative   HHof 2 dog    Just  Died   Egbert Garibaldi    Retired and married   [name]      Dog with health issues  Cooks for him bladder stones    Outpatient Encounter Prescriptions as of 04/11/2012  Medication Sig Dispense Refill  . aspirin 81 MG chewable tablet Chew 81 mg by mouth daily.      . Calcium Carbonate-Vitamin D 600-400 MG-UNIT per tablet Take 1 tablet by mouth every other day.       . esomeprazole (NEXIUM) 40 MG capsule Take 1 capsule (40 mg total) by mouth 2 (two) times daily.  60 capsule  11  . esomeprazole (NEXIUM) 40 MG capsule Take 1 capsule (40 mg total) by mouth  daily.  20 capsule  0  . MAGNESIUM PO Take by mouth.      . Methylsulfonylmethane (MSM) 1000 MG CAPS Take 1,000 mg by mouth every other day.       . Multiple Vitamin (MULTIVITAMIN) tablet Take 1 tablet by mouth daily.        . vitamin B-12 (CYANOCOBALAMIN) 500 MCG tablet Take 500 mcg by mouth daily.        [name] ALPRAZolam (XANAX) 0.25 MG tablet Take 1 tablet (0.25 mg total) by mouth 3 (three) times daily as needed for anxiety.  24 tablet  0  . [DISCONTINUED] amoxicillin (AMOXIL) 500 MG capsule Take 1 capsule (500 mg total) by mouth 3 (three) times daily.  30 capsule  0  . [DISCONTINUED] estrogens, conjugated, (PREMARIN) 0.45 MG tablet Take 1 po qd  30 tablet  5   No facility-administered encounter medications on file as of 04/11/2012.    EXAM:  BP 140/76  Pulse 68  Temp(Src) 98 F (36.7 C) (Oral)  Ht 5' 4.75" (1.645 m)  Wt 149 lb (67.586 kg)  BMI 24.98 kg/m2  SpO2 97%  Body mass index is 24.98 kg/(m^2).  Physical Exam: Vital signs reviewed FAO:ZHYQ is a well-developed well-nourished alert cooperative   who appears stated age in no acute distress.  HEENT: normocephalic atraumatic , Eyes: PERRL EOM's full, conjunctiva clear, Nares: paten,t no deformity discharge or tenderness., Ears: no deformity EAC's clear TMs with normal landmarks. Mouth: clear OP, no lesions, edema.  Moist mucous membranes. Dentition in adequate repair. NECK: supple a bit tender low supra-sternal notch thyroid palpable at this area no obvious nodules. No bruits are heard. CHEST/PULM:  Clear to auscultation and percussion breath sounds equal no wheeze , rales or rhonchi. No chest wall deformities or tenderness. Breast: normal by inspection . No dimpling, discharge, masses, tenderness or discharge . CV: PMI is nondisplaced, S1 S2 no gallops, murmurs, rubs. Occasional premature beat otherwise regular rhythm Peripheral pulses are full without delay.No JVD .  ABDOMEN: Bowel sounds normal nontender  No guard or rebound, no  hepato splenomegal no CVA tenderness.  No hernia. Extremtities:  No clubbing cyanosis or edema, no acute joint swelling or redness no focal atrophy NEURO:  Oriented x3, cranial nerves 3-12 appear to be intact, no obvious focal weakness,gait within normal limits no abnormal reflexes or asymmetrical SKIN: No acute rashes normal turgor, color, no bruising or petechiae. Som superficial VV legs  PSYCH: Oriented, good eye contact, no obvious depression mildly anxious normal speech, cognition and judgment appear normal. LN: no cervical axillary inguinal adenopathy No  noted deficits in memory, attention, and speech.   Lab Results  Component Value Date   WBC 5.5 04/05/2012   HGB 13.4 04/05/2012   HCT 40.2 04/05/2012   PLT 227.0 04/05/2012   GLUCOSE 98 04/05/2012   CHOL 236* 04/05/2012   TRIG 131.0 04/05/2012   HDL 74.90 04/05/2012   LDLDIRECT 148.8 04/05/2012   ALT 23 04/05/2012   AST 21 04/05/2012   NA 138 04/05/2012   K 3.9 04/05/2012   CL 103 04/05/2012   CREATININE 0.9 04/05/2012   BUN 19 04/05/2012   CO2 29 04/05/2012   TSH 1.77 04/05/2012   INR 0.9 RATIO 03/29/2007  ekg  Ectopic  Ventricular couplets  Sinus no acute changes .     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  and has been on hrt sice them until recent wean.   Other primary cardiomyopathies  Other specified cardiac dysrhythmias - PVCs on  ekg  nocurrent fu  consider   other  opinion.  - Plan: EKG 12-Lead  Cardiac dysrhythmia, unspecified  Vision changes  episodic  - neuro opinion pending  Abnormal thyroid exam - tender isthmus?  vs other   do [place] and fu  - Plan: US Soft Tissue Head/Neck  Elevated lipids Patient feels that she has had a bone density scan in the last few years we'll check the electronic record and see when that was and when she would be due.  We discussed a good bit  about my concerns about headaches and estrogen and visual changes. Her gynecologist years ago told her to never go off estrogen as she has had a surgical menopause. Certainly some sleep and irritability could be related to withdrawal from estrogen  but at this point I would think the risk is higher than the benefit. We are getting neurology opinion.  Last office visit with cardiology was a couple years ago and she couldn't take the medication given was told she didn't need to come back, she was having passing out spells. If returning she asks another opinion cardiology.  Consider repeat echo and ?  Holter?  . [facility] disc at [facility] here   Restart  Xanax that she has not been on for a while ;where she takes only half as needed and curds to longer acting been so such as Ativan or Klonopin and if we need to take longer to avoid risk of rebound and dependency but patient states that she wouldn't be taking very much time. Have her followup in one to 2 months and reevaluate multiple issues .   Patient Care Team: Madelin Headings, MD as PCP - General Marinus Maw, MD (Cardiology) Mardella Layman, MD (Gastroenterology) Elmon Else, MD (Dermatology) DUNN  for eye exams   Patient Instructions  Keep appt with neurology. ekg still shows some irreg beats at times.   Cardiology follow up .  Ok to try short term xanax temp for stopping the   Estrogen.  If more needed would use different prep such as ativan clonopin.   Will get  An thyroid ultrasound   To check the uncomfortable area  Discussed .   Plan follow up visit after evaluations and thyroid ultrasound completed .   I f you haven't  had one recently  Would check bone density     Preventive Care for Adults, Female A healthy  lifestyle and preventive care can promote health and wellness. Preventive health guidelines for women include the following key practices.  A routine yearly physical is a good way to check with your caregiver about your health  and preventive screening. It is a chance to share any concerns and updates on your health, and to receive a thorough exam.  Visit your dentist for a routine exam and preventive care every 6 months. Brush your teeth twice a day and floss once a day. Good oral hygiene prevents tooth decay and gum disease.  The frequency of eye exams is based on your age, health, family medical history, use of contact lenses, and other factors. Follow your caregiver's recommendations for frequency of eye exams.  Eat a healthy diet. Foods like vegetables, fruits, whole grains, low-fat dairy products, and lean protein foods contain the nutrients you need without too many calories. Decrease your intake of foods high in solid fats, added sugars, and salt. Eat the right amount of calories for you.Get information about a proper diet from your caregiver, if necessary.  Regular physical exercise is one of the most important things you can do for your health. Most adults should get at least 150 minutes of moderate-intensity exercise (any activity that increases your heart rate and causes you to sweat) each week. In addition, most adults need muscle-strengthening exercises on 2 or more days a week.  Maintain a healthy weight. The body mass index (BMI) is a screening tool to identify possible weight problems. It provides an estimate of body fat based on height and weight. Your caregiver can help determine your BMI, and can help you achieve or maintain a healthy weight.For adults 20 years and older:  A BMI below 18.5 is considered underweight.  A BMI of 18.5 to 24.9 is normal.  A BMI of 25 to 29.9 is considered overweight.  A BMI of 30 and above is considered obese.  Maintain normal blood lipids and cholesterol levels by exercising and minimizing your intake of saturated fat. Eat a balanced diet with plenty of fruit and vegetables. Blood tests for lipids and cholesterol should begin at age 18 and be repeated every 5 years. If  your lipid or cholesterol levels are high, you are over 50, or you are at high risk for heart disease, you may need your cholesterol levels checked more frequently.Ongoing high lipid and cholesterol levels should be treated with medicines if diet and exercise are not effective.  If you smoke, find out from your caregiver how to quit. If you do not use tobacco, do not start.  If you are pregnant, do not drink alcohol. If you are breastfeeding, be very cautious about drinking alcohol. If you are not pregnant and choose to drink alcohol, do not exceed 1 drink per day. One drink is considered to be 12 ounces (355 mL) of beer, 5 ounces (148 mL) of wine, or 1.5 ounces (44 mL) of liquor.  Avoid use of street drugs. Do not share needles with anyone. Ask for help if you need support or instructions about stopping the use of drugs.  High blood pressure causes heart disease and increases the risk of stroke. Your blood pressure should be checked at least every 1 to 2 years. Ongoing high blood pressure should be treated with medicines if weight loss and exercise are not effective.  If you are 52 to 70 years old, ask your caregiver if you should take aspirin to prevent strokes.  Diabetes screening involves taking a blood sample  to check your fasting blood sugar level. This should be done once every 3 years, after age 56, if you are within normal weight and without risk factors for diabetes. Testing should be considered at a younger age or be carried out more frequently if you are overweight and have at least 1 risk factor for diabetes.  Breast cancer screening is essential preventive care for women. You should practice "breast self-awareness." This means understanding the normal appearance and feel of your breasts and may include breast self-examination. Any changes detected, no matter how small, should be reported to a caregiver. Women in their 3s and 30s should have a clinical breast exam (CBE) by a caregiver as  part of a regular health exam every 1 to 3 years. After age 84, women should have a CBE every year. Starting at age 56, women should consider having a mammography (breast X-ray test) every year. Women who have a family history of breast cancer should talk to their caregiver about genetic screening. Women at a high risk of breast cancer should talk to their caregivers about having magnetic resonance imaging (MRI) and a mammography every year.  The Pap test is a screening test for cervical cancer. A Pap test can show cell changes on the cervix that might become cervical cancer if left untreated. A Pap test is a procedure in which cells are obtained and examined from the lower end of the uterus (cervix).  Women should have a Pap test starting at age 39.  Between ages 29 and 57, Pap tests should be repeated every 2 years.  Beginning at age 79, you should have a Pap test every 3 years as long as the past 3 Pap tests have been normal.  Some women have medical problems that increase the chance of getting cervical cancer. Talk to your caregiver about these problems. It is especially important to talk to your caregiver if a new problem develops soon after your last Pap test. In these cases, your caregiver may recommend more frequent screening and Pap tests.  The above recommendations are the same for women who have or have not gotten the vaccine for human papillomavirus (HPV).  If you had a hysterectomy for a problem that was not cancer or a condition that could lead to cancer, then you no longer need Pap tests. Even if you no longer need a Pap test, a regular exam is a good idea to make sure no other problems are starting.  If you are between ages 71 and 97, and you have had normal Pap tests going back 10 years, you no longer need Pap tests. Even if you no longer need a Pap test, a regular exam is a good idea to make sure no other problems are starting.  If you have had past treatment for cervical cancer  or a condition that could lead to cancer, you need Pap tests and screening for cancer for at least 20 years after your treatment.  If Pap tests have been discontinued, risk factors (such as a new sexual partner) need to be reassessed to determine if screening should be resumed.  The HPV test is an additional test that may be used for cervical cancer screening. The HPV test looks for the virus that can cause the cell changes on the cervix. The cells collected during the Pap test can be tested for HPV. The HPV test could be used to screen women aged 69 years and older, and should be used in women of any  age who have unclear Pap test results. After the age of 90, women should have HPV testing at the same frequency as a Pap test.  Colorectal cancer can be detected and often prevented. Most routine colorectal cancer screening begins at the age of 79 and continues through age 21. However, your caregiver may recommend screening at an earlier age if you have risk factors for colon cancer. On a yearly basis, your caregiver may provide home test kits to check for hidden blood in the stool. Use of a small camera at the end of a tube, to directly examine the colon (sigmoidoscopy or colonoscopy), can detect the earliest forms of colorectal cancer. Talk to your caregiver about this at age 66, when routine screening begins. Direct examination of the colon should be repeated every 5 to 10 years through age 59, unless early forms of pre-cancerous polyps or small growths are found.  Hepatitis C blood testing is recommended for all people born from [birth year] through 1963 and any individual with known risks for hepatitis C.  Practice safe sex. Use condoms and avoid high-risk sexual practices to reduce the spread of sexually transmitted infections (STIs). STIs include gonorrhea, chlamydia, syphilis, trichomonas, herpes, HPV, and human immunodeficiency virus (HIV). Herpes, HIV, and HPV are viral illnesses that have no cure. They  can result in disability, cancer, and death. Sexually active women aged 60 and younger should be checked for chlamydia. Older women with new or multiple partners should also be tested for chlamydia. Testing for other STIs is recommended if you are sexually active and at increased risk.  Osteoporosis is a disease in which the bones lose minerals and strength with aging. This can result in serious bone fractures. The risk of osteoporosis can be identified using a bone density scan. Women ages 48 and over and women at risk for fractures or osteoporosis should discuss screening with their caregivers. Ask your caregiver whether you should take a calcium supplement or vitamin D to reduce the rate of osteoporosis.  Menopause can be associated with physical symptoms and risks. Hormone replacement therapy is available to decrease symptoms and risks. You should talk to your caregiver about whether hormone replacement therapy is right for you.  Use sunscreen with sun protection factor (SPF) of 30 or more. Apply sunscreen liberally and repeatedly throughout the day. You should seek shade when your shadow is shorter than you. Protect yourself by wearing long sleeves, pants, a wide-brimmed hat, and sunglasses year round, whenever you are outdoors.  Once a month, do a whole body skin exam, using a mirror to look at the skin on your back. Notify your caregiver of new moles, moles that have irregular borders, moles that are larger than a pencil eraser, or moles that have changed in shape or color.  Stay current with required immunizations.  Influenza. You need a dose every fall (or winter). The composition of the flu vaccine changes each year, so being vaccinated once is not enough.  Pneumococcal polysaccharide. You need 1 to 2 doses if you smoke cigarettes or if you have certain chronic medical conditions. You need 1 dose at age 65 (or older) if you have never been vaccinated.  Tetanus, diphtheria, pertussis (Tdap,  Td). Get 1 dose of Tdap vaccine if you are younger than age 59, are over 50 and have contact with an infant, are a Research scientist (physical sciences), are pregnant, or simply want to be protected from whooping cough. After that, you need a Td booster dose every 10 years. Consult  your caregiver if you have not had at least 3 tetanus and diphtheria-containing shots sometime in your life or have a deep or dirty wound.  HPV. You need this vaccine if you are a woman age 46 or younger. The vaccine is given in 3 doses over 6 months.  Measles, mumps, rubella (MMR). You need at least 1 dose of MMR if you were born in 1957 or later. You may also need a second dose.  Meningococcal. If you are age 62 to 39 and a first-year college student living in a residence hall, or have one of several medical conditions, you need to get vaccinated against meningococcal disease. You may also need additional booster doses.  Zoster (shingles). If you are age 12 or older, you should get this vaccine.  Varicella (chickenpox). If you have never had chickenpox or you were vaccinated but received only 1 dose, talk to your caregiver to find out if you need this vaccine.  Hepatitis A. You need this vaccine if you have a specific risk factor for hepatitis A virus infection or you simply wish to be protected from this disease. The vaccine is usually given as 2 doses, 6 to 18 months apart.  Hepatitis B. You need this vaccine if you have a specific risk factor for hepatitis B virus infection or you simply wish to be protected from this disease. The vaccine is given in 3 doses, usually over 6 months.     Neta Mends. Jamile Rekowski M.D.  Health Maintenance  Topic Date Due  . Influenza Vaccine  09/26/2012  . Mammogram  12/27/2013  . Tetanus/tdap  09/24/2021  . Colonoscopy  01/24/2022  . Pneumococcal Polysaccharide Vaccine Age 50 And Over  Completed  . Zostavax  Completed   Health Maintenance Review  Addendum rec review  Last  Dexa 1`/ 2011 and normal  range

## 2012-04-11 NOTE — Patient Instructions (Addendum)
Keep appt with neurology. ekg still shows some irreg beats at times.   Cardiology follow up .  Ok to try short term xanax temp for stopping the   Estrogen.  If more needed would use different prep such as ativan clonopin.   Will get  An thyroid ultrasound   To check the uncomfortable area  Discussed .   Plan follow up visit after evaluations and thyroid ultrasound completed .   I f you haven't  had one recently  Would check bone density     Preventive Care for Adults, Female A healthy lifestyle and preventive care can promote health and wellness. Preventive health guidelines for women include the following key practices.  A routine yearly physical is a good way to check with your caregiver about your health and preventive screening. It is a chance to share any concerns and updates on your health, and to receive a thorough exam.  Visit your dentist for a routine exam and preventive care every 6 months. Brush your teeth twice a day and floss once a day. Good oral hygiene prevents tooth decay and gum disease.  The frequency of eye exams is based on your age, health, family medical history, use of contact lenses, and other factors. Follow your caregiver's recommendations for frequency of eye exams.  Eat a healthy diet. Foods like vegetables, fruits, whole grains, low-fat dairy products, and lean protein foods contain the nutrients you need without too many calories. Decrease your intake of foods high in solid fats, added sugars, and salt. Eat the right amount of calories for you.Get information about a proper diet from your caregiver, if necessary.  Regular physical exercise is one of the most important things you can do for your health. Most adults should get at least 150 minutes of moderate-intensity exercise (any activity that increases your heart rate and causes you to sweat) each week. In addition, most adults need muscle-strengthening exercises on 2 or more days a week.  Maintain a  healthy weight. The body mass index (BMI) is a screening tool to identify possible weight problems. It provides an estimate of body fat based on height and weight. Your caregiver can help determine your BMI, and can help you achieve or maintain a healthy weight.For adults 20 years and older:  A BMI below 18.5 is considered underweight.  A BMI of 18.5 to 24.9 is normal.  A BMI of 25 to 29.9 is considered overweight.  A BMI of 30 and above is considered obese.  Maintain normal blood lipids and cholesterol levels by exercising and minimizing your intake of saturated fat. Eat a balanced diet with plenty of fruit and vegetables. Blood tests for lipids and cholesterol should begin at age 76 and be repeated every 5 years. If your lipid or cholesterol levels are high, you are over 50, or you are at high risk for heart disease, you may need your cholesterol levels checked more frequently.Ongoing high lipid and cholesterol levels should be treated with medicines if diet and exercise are not effective.  If you smoke, find out from your caregiver how to quit. If you do not use tobacco, do not start.  If you are pregnant, do not drink alcohol. If you are breastfeeding, be very cautious about drinking alcohol. If you are not pregnant and choose to drink alcohol, do not exceed 1 drink per day. One drink is considered to be 12 ounces (355 mL) of beer, 5 ounces (148 mL) of wine, or 1.5 ounces (44 mL)  of liquor.  Avoid use of street drugs. Do not share needles with anyone. Ask for help if you need support or instructions about stopping the use of drugs.  High blood pressure causes heart disease and increases the risk of stroke. Your blood pressure should be checked at least every 1 to 2 years. Ongoing high blood pressure should be treated with medicines if weight loss and exercise are not effective.  If you are 33 to 70 years old, ask your caregiver if you should take aspirin to prevent strokes.  Diabetes  screening involves taking a blood sample to check your fasting blood sugar level. This should be done once every 3 years, after age 24, if you are within normal weight and without risk factors for diabetes. Testing should be considered at a younger age or be carried out more frequently if you are overweight and have at least 1 risk factor for diabetes.  Breast cancer screening is essential preventive care for women. You should practice "breast self-awareness." This means understanding the normal appearance and feel of your breasts and may include breast self-examination. Any changes detected, no matter how small, should be reported to a caregiver. Women in their 81s and 30s should have a clinical breast exam (CBE) by a caregiver as part of a regular health exam every 1 to 3 years. After age 37, women should have a CBE every year. Starting at age 72, women should consider having a mammography (breast X-ray test) every year. Women who have a family history of breast cancer should talk to their caregiver about genetic screening. Women at a high risk of breast cancer should talk to their caregivers about having magnetic resonance imaging (MRI) and a mammography every year.  The Pap test is a screening test for cervical cancer. A Pap test can show cell changes on the cervix that might become cervical cancer if left untreated. A Pap test is a procedure in which cells are obtained and examined from the lower end of the uterus (cervix).  Women should have a Pap test starting at age 29.  Between ages 37 and 32, Pap tests should be repeated every 2 years.  Beginning at age 39, you should have a Pap test every 3 years as long as the past 3 Pap tests have been normal.  Some women have medical problems that increase the chance of getting cervical cancer. Talk to your caregiver about these problems. It is especially important to talk to your caregiver if a new problem develops soon after your last Pap test. In these  cases, your caregiver may recommend more frequent screening and Pap tests.  The above recommendations are the same for women who have or have not gotten the vaccine for human papillomavirus (HPV).  If you had a hysterectomy for a problem that was not cancer or a condition that could lead to cancer, then you no longer need Pap tests. Even if you no longer need a Pap test, a regular exam is a good idea to make sure no other problems are starting.  If you are between ages 56 and 60, and you have had normal Pap tests going back 10 years, you no longer need Pap tests. Even if you no longer need a Pap test, a regular exam is a good idea to make sure no other problems are starting.  If you have had past treatment for cervical cancer or a condition that could lead to cancer, you need Pap tests and screening for cancer  for at least 20 years after your treatment.  If Pap tests have been discontinued, risk factors (such as a new sexual partner) need to be reassessed to determine if screening should be resumed.  The HPV test is an additional test that may be used for cervical cancer screening. The HPV test looks for the virus that can cause the cell changes on the cervix. The cells collected during the Pap test can be tested for HPV. The HPV test could be used to screen women aged 39 years and older, and should be used in women of any age who have unclear Pap test results. After the age of 79, women should have HPV testing at the same frequency as a Pap test.  Colorectal cancer can be detected and often prevented. Most routine colorectal cancer screening begins at the age of 55 and continues through age 74. However, your caregiver may recommend screening at an earlier age if you have risk factors for colon cancer. On a yearly basis, your caregiver may provide home test kits to check for hidden blood in the stool. Use of a small camera at the end of a tube, to directly examine the colon (sigmoidoscopy or  colonoscopy), can detect the earliest forms of colorectal cancer. Talk to your caregiver about this at age 46, when routine screening begins. Direct examination of the colon should be repeated every 5 to 10 years through age 63, unless early forms of pre-cancerous polyps or small growths are found.  Hepatitis C blood testing is recommended for all people born from [birth year] through 1963 and any individual with known risks for hepatitis C.  Practice safe sex. Use condoms and avoid high-risk sexual practices to reduce the spread of sexually transmitted infections (STIs). STIs include gonorrhea, chlamydia, syphilis, trichomonas, herpes, HPV, and human immunodeficiency virus (HIV). Herpes, HIV, and HPV are viral illnesses that have no cure. They can result in disability, cancer, and death. Sexually active women aged 57 and younger should be checked for chlamydia. Older women with new or multiple partners should also be tested for chlamydia. Testing for other STIs is recommended if you are sexually active and at increased risk.  Osteoporosis is a disease in which the bones lose minerals and strength with aging. This can result in serious bone fractures. The risk of osteoporosis can be identified using a bone density scan. Women ages 32 and over and women at risk for fractures or osteoporosis should discuss screening with their caregivers. Ask your caregiver whether you should take a calcium supplement or vitamin D to reduce the rate of osteoporosis.  Menopause can be associated with physical symptoms and risks. Hormone replacement therapy is available to decrease symptoms and risks. You should talk to your caregiver about whether hormone replacement therapy is right for you.  Use sunscreen with sun protection factor (SPF) of 30 or more. Apply sunscreen liberally and repeatedly throughout the day. You should seek shade when your shadow is shorter than you. Protect yourself by wearing long sleeves, pants, a  wide-brimmed hat, and sunglasses year round, whenever you are outdoors.  Once a month, do a whole body skin exam, using a mirror to look at the skin on your back. Notify your caregiver of new moles, moles that have irregular borders, moles that are larger than a pencil eraser, or moles that have changed in shape or color.  Stay current with required immunizations.  Influenza. You need a dose every fall (or winter). The composition of the flu vaccine changes  each year, so being vaccinated once is not enough.  Pneumococcal polysaccharide. You need 1 to 2 doses if you smoke cigarettes or if you have certain chronic medical conditions. You need 1 dose at age 11 (or older) if you have never been vaccinated.  Tetanus, diphtheria, pertussis (Tdap, Td). Get 1 dose of Tdap vaccine if you are younger than age 55, are over 84 and have contact with an infant, are a Research scientist (physical sciences), are pregnant, or simply want to be protected from whooping cough. After that, you need a Td booster dose every 10 years. Consult your caregiver if you have not had at least 3 tetanus and diphtheria-containing shots sometime in your life or have a deep or dirty wound.  HPV. You need this vaccine if you are a woman age 20 or younger. The vaccine is given in 3 doses over 6 months.  Measles, mumps, rubella (MMR). You need at least 1 dose of MMR if you were born in 1957 or later. You may also need a second dose.  Meningococcal. If you are age 77 to 85 and a first-year college student living in a residence hall, or have one of several medical conditions, you need to get vaccinated against meningococcal disease. You may also need additional booster doses.  Zoster (shingles). If you are age 52 or older, you should get this vaccine.  Varicella (chickenpox). If you have never had chickenpox or you were vaccinated but received only 1 dose, talk to your caregiver to find out if you need this vaccine.  Hepatitis A. You need this vaccine if  you have a specific risk factor for hepatitis A virus infection or you simply wish to be protected from this disease. The vaccine is usually given as 2 doses, 6 to 18 months apart.  Hepatitis B. You need this vaccine if you have a specific risk factor for hepatitis B virus infection or you simply wish to be protected from this disease. The vaccine is given in 3 doses, usually over 6 months.

## 2012-04-12 ENCOUNTER — Ambulatory Visit
Admission: RE | Admit: 2012-04-12 | Discharge: 2012-04-12 | Disposition: A | Discharge status: Home / Self Care | Payer: Medicare Other | Source: Ambulatory Visit | Attending: Internal Medicine | Admitting: Internal Medicine

## 2012-04-12 DIAGNOSIS — R946 Abnormal results of thyroid function studies: Secondary | ICD-10-CM

## 2012-04-13 ENCOUNTER — Ambulatory Visit (INDEPENDENT_AMBULATORY_CARE_PROVIDER_SITE_OTHER): Payer: Medicare Other | Admitting: Neurology

## 2012-04-13 ENCOUNTER — Encounter: Payer: Self-pay | Admitting: Neurology

## 2012-04-13 VITALS — BP 124/70 | HR 76 | Temp 97.5°F | Resp 12 | Ht 65.0 in | Wt 148.0 lb

## 2012-04-13 DIAGNOSIS — G43809 Other migraine, not intractable, without status migrainosus: Secondary | ICD-10-CM
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
## 2012-04-13 NOTE — Patient Instructions (Addendum)
Follow up with Dr. Smiley Houseman in 6 weeks.

## 2012-04-14 ENCOUNTER — Encounter: Payer: Self-pay | Admitting: Internal Medicine

## 2012-04-22 ENCOUNTER — Other Ambulatory Visit: Payer: Self-pay | Admitting: Family Medicine

## 2012-04-22 DIAGNOSIS — E041 Nontoxic single thyroid nodule: Secondary | ICD-10-CM

## 2012-05-17 ENCOUNTER — Telehealth: Payer: Self-pay | Admitting: Neurology

## 2012-05-17 NOTE — Telephone Encounter (Signed)
Pt cancelled 6 week follow up w/ Dr. Smiley Houseman. She did not wish to r/s at this time / Alisha S.

## 2012-05-25 ENCOUNTER — Ambulatory Visit: Payer: Medicare Other | Admitting: Neurology

## 2012-05-30 ENCOUNTER — Encounter: Payer: Self-pay | Admitting: Internal Medicine

## 2012-05-30 ENCOUNTER — Ambulatory Visit (INDEPENDENT_AMBULATORY_CARE_PROVIDER_SITE_OTHER): Payer: Medicare Other | Admitting: Internal Medicine

## 2012-05-30 VITALS — BP 122/80 | HR 92 | Temp 97.9°F | Wt 152.0 lb

## 2012-05-30 DIAGNOSIS — K219 Gastro-esophageal reflux disease without esophagitis: Secondary | ICD-10-CM

## 2012-05-30 DIAGNOSIS — E079 Disorder of thyroid, unspecified: Secondary | ICD-10-CM

## 2012-05-30 DIAGNOSIS — R946 Abnormal results of thyroid function studies: Secondary | ICD-10-CM

## 2012-05-30 DIAGNOSIS — N951 Menopausal and female climacteric states: Secondary | ICD-10-CM

## 2012-05-30 DIAGNOSIS — Z8719 Personal history of other diseases of the digestive system: Secondary | ICD-10-CM | POA: Insufficient documentation

## 2012-05-30 DIAGNOSIS — K449 Diaphragmatic hernia without obstruction or gangrene: Secondary | ICD-10-CM

## 2012-05-30 DIAGNOSIS — E785 Hyperlipidemia, unspecified: Secondary | ICD-10-CM

## 2012-05-30 DIAGNOSIS — R002 Palpitations: Secondary | ICD-10-CM

## 2012-05-30 LAB — CBC WITH DIFFERENTIAL/PLATELET
Basophils Relative: 0.6 % (ref 0.0–3.0)
Eosinophils Absolute: 0.1 10*3/uL (ref 0.0–0.7)
Eosinophils Relative: 2 % (ref 0.0–5.0)
HCT: 41.2 % (ref 36.0–46.0)
Lymphs Abs: 2.5 10*3/uL (ref 0.7–4.0)
MCHC: 33.8 g/dL (ref 30.0–36.0)
MCV: 89.3 fl (ref 78.0–100.0)
Monocytes Absolute: 0.6 10*3/uL (ref 0.1–1.0)
Neutrophils Relative %: 55.4 % (ref 43.0–77.0)
Platelets: 218 10*3/uL (ref 150.0–400.0)

## 2012-05-30 NOTE — Progress Notes (Signed)
Chief Complaint  Patient presents with  . Follow-up    HPI:  Patient comes in today for follow up of  multiple medical problems.  Since her last visit she has seen Dr. Smiley Houseman in neurology have her monitor her symptoms and to come back if continuing consider injections. Since that time she feels she is getting better with only occasional spells much better off of the Premarin however Hot flush and cold feelings that interrupting sleep at night aching her tired and a bit grouchy. Was told by Dr. Jarold Motto to Increase nexium twice a day but has  Dosage and concern about side effect.  She looks on line some of the rare but severe side effects or enlarged thyroid and many of her symptoms so she stopped the twice a day is only taking it once a day she was seen for a large hiatal hernia and esophageal stricture. In regard to her Arrythmia not a satisfaction with cardiology had side effects with With this verapamil and atenolol. Has had no event monitor or Holter's for least 10 years.  Can feel when her heart is racing and occasionally when it skips a beat years ago she was told to not walk upstairs because her heart arrhythmias would get worse "Would like thyroid  Happy .  "Has a remote history of thyroid evaluation. She did have an ultrasound recently. Wonders if her thyroid could be causing some of the symptoms  Has used a small amount of Xanax which does help to calm her down and feel better but only uses it occasionally ROS: See pertinent positives and negatives per HPI.  Past Medical History  Diagnosis Date  . CHF (congestive heart failure)   . Hypertension   . Hyperlipidemia   . Headache   . Esophageal reflux   . Hiatal hernia   . Allergy   . Anemia   . Anxiety   . Heart murmur   . Seizures     Family History  Problem Relation Age of Onset  . Diabetes Mother   . Diabetes Son   . Breast cancer    . Colon cancer Neg Hx   . Breast cancer Maternal Grandmother     History   Social  History  . Marital Status: Married    Spouse Name: N/A    Number of Children: 1  . Years of Education: N/A   Occupational History  . retired    Social History Main Topics  . Smoking status: Never Smoker   . Smokeless tobacco: Never Used  . Alcohol Use: No     Comment: rarely  . Drug Use: No  . Sexually Active: None   Other Topics Concern  . None   Social History Narrative   HHof 2 dog    Just  Died   Egbert Garibaldi    Retired and married   [name]      Dog with health issues  Cooks for him bladder stones    Outpatient Encounter Prescriptions as of 05/30/2012  Medication Sig Dispense Refill  . ALPRAZolam (XANAX) 0.25 MG tablet Take 1 tablet (0.25 mg total) by mouth 3 (three) times daily as needed for anxiety.  24 tablet  0  . aspirin 81 MG chewable tablet Chew 81 mg by mouth daily.      . Calcium Carbonate-Vitamin D 600-400 MG-UNIT per tablet Take 1 tablet by mouth every other day.       . esomeprazole (NEXIUM) 40 MG capsule Take 1 capsule (40 mg  total) by mouth 2 (two) times daily.  60 capsule  11  . Krill Oil 500 MG CAPS Take 1 capsule by mouth daily.      [name] MAGNESIUM PO Take by mouth.      . Methylsulfonylmethane (MSM) 1000 MG CAPS Take 1,000 mg by mouth every other day.       . Multiple Vitamin (MULTIVITAMIN) tablet Take 1 tablet by mouth daily.        . vitamin B-12 (CYANOCOBALAMIN) 500 MCG tablet Take 500 mcg by mouth daily.        [name] OVER THE COUNTER MEDICATION       . [DISCONTINUED] esomeprazole (NEXIUM) 40 MG capsule Take 1 capsule (40 mg total) by mouth daily.  20 capsule  0   No facility-administered encounter medications on file as of 05/30/2012.    EXAM:  BP 122/80  Pulse 92  Temp(Src) 97.9 F (36.6 C) (Oral)  Wt 152 lb (68.947 kg)  BMI 25.29 kg/m2  SpO2 97%  Body mass index is 25.29 kg/(m^2).  GENERAL: vitals reviewed and listed above, alert, oriented, appears well hydrated and in no acute distress  HEENT: atraumatic, conjunctiva  clear, no obvious abnormalities  on inspection of external nose and ears OP : no lesion edema or exudate   NECK: no obvious masses on inspection palpation thyroid is palpable nontender today  LUNGS: clear to auscultation bilaterally, no wheezes, rales or rhonchi, good air movement  CV: No gallops or murmurs many premature beats noted rate normal, no clubbing cyanosis or  peripheral edema nl cap refill   MS: moves all extremities without noticeable focal  abnormality  PSYCH: pleasant and cooperative, no obvious depression  mildly anxious normal eye contact no tremor  ASSESSMENT AND PLAN:  Discussed the following assessment and plan:  MENOPAUSE-RELATED VASOMOTOR SYMPTOMS, HOT FLASHES - Plan: Krill Oil 500 MG CAPS, TSH, T4, free, T3, free, Magnesium, Thyroid antibodies, Basic metabolic panel, CBC with Differential, Lipid panel, Hepatic function panel  GERD - Plan: Krill Oil 500 MG CAPS, TSH, T4, free, T3, free, Magnesium, Thyroid antibodies, Basic metabolic panel, CBC with Differential, Lipid panel, Hepatic function panel  HIATAL HERNIA - Plan: Krill Oil 500 MG CAPS, TSH, T4, free, T3, free, Magnesium, Thyroid antibodies, Basic metabolic panel, CBC with Differential, Lipid panel, Hepatic function panel  Abnormal thyroid exam - Plan: Krill Oil 500 MG CAPS, TSH, T4, free, T3, free, Magnesium, Thyroid antibodies, Basic metabolic panel, CBC with Differential, Lipid panel, Hepatic function panel  Palpitations - Plan: Krill Oil 500 MG CAPS, TSH, T4, free, T3, free, Magnesium, Thyroid antibodies, Basic metabolic panel, CBC with Differential, Lipid panel, Hepatic function panel  HYPERLIPIDEMIA - Plan: Krill Oil 500 MG CAPS, TSH, T4, free, T3, free, Magnesium, Thyroid antibodies, Basic metabolic panel, CBC with Differential, Lipid panel, Hepatic function panel  History of esophageal stricture Patient is concerned about side effects of Nexium possibly causing her symptoms therefore did not take it twice a day as advised. Discuss  with her the pluses and minuses because she had esophageal stricture this is problematic. Agrees to go to twice a day until she comes back. I doubt that is causing her symptoms although you can get mineral malabsorption review of her record we'll check full thyroid labs a BMP and magnesium etc. Uncertain what to make of her palpitations certainly she has very frequent PVCs on her last exam and probably today although EKG not done today.  Discussed estrogen withdrawal other medicines may have more risk than  benefit we'll observe over time. Followup in a couple months however if thyroid tests are abnormal we'll refer her to endocrine in the meantime. Okay to use low dose Xanax as needed call for refill if needed -Patient advised to return or notify health care team  if symptoms worsen or persist or new concerns arise.  Patient Instructions  I agree with   Stopping the  Estrogen  For the reasons discussed.  Will have sleep issue  For a while . Flushing also  Hope fully will decrease with time.  Will notify you  of labs when available.   And then plan follow up.   2 months .       Neta Mends. Panosh M.D.

## 2012-05-30 NOTE — Patient Instructions (Addendum)
I agree with   Stopping the  Estrogen  For the reasons discussed.  Will have sleep issue  For a while . Flushing also  Hope fully will decrease with time.  Will notify you  of labs when available.   And then plan follow up.   2 months .

## 2012-05-31 LAB — TSH: TSH: 1.44 u[IU]/mL (ref 0.35–5.50)

## 2012-05-31 LAB — BASIC METABOLIC PANEL
BUN: 14 mg/dL (ref 6–23)
CO2: 28 mEq/L (ref 19–32)
Chloride: 104 mEq/L (ref 96–112)
Creatinine, Ser: 0.9 mg/dL (ref 0.4–1.2)
Potassium: 4.6 mEq/L (ref 3.5–5.1)

## 2012-05-31 LAB — HEPATIC FUNCTION PANEL
ALT: 20 U/L (ref 0–35)
AST: 19 U/L (ref 0–37)
Alkaline Phosphatase: 73 U/L (ref 39–117)
Total Bilirubin: 0.7 mg/dL (ref 0.3–1.2)

## 2012-05-31 LAB — T4, FREE: Free T4: 0.91 ng/dL (ref 0.60–1.60)

## 2012-05-31 LAB — LIPID PANEL
Total CHOL/HDL Ratio: 3
Triglycerides: 89 mg/dL (ref 0.0–149.0)

## 2012-06-01 LAB — LDL CHOLESTEROL, DIRECT: Direct LDL: 169.1 mg/dL

## 2012-08-15 ENCOUNTER — Encounter: Payer: Self-pay | Admitting: Internal Medicine

## 2012-08-15 ENCOUNTER — Ambulatory Visit (INDEPENDENT_AMBULATORY_CARE_PROVIDER_SITE_OTHER): Payer: Medicare Other | Admitting: Internal Medicine

## 2012-08-15 VITALS — BP 148/80 | HR 88 | Temp 97.8°F | Wt 152.0 lb

## 2012-08-15 DIAGNOSIS — L259 Unspecified contact dermatitis, unspecified cause: Secondary | ICD-10-CM

## 2012-08-15 DIAGNOSIS — N951 Menopausal and female climacteric states: Secondary | ICD-10-CM

## 2012-08-15 DIAGNOSIS — L239 Allergic contact dermatitis, unspecified cause: Secondary | ICD-10-CM | POA: Insufficient documentation

## 2012-08-15 DIAGNOSIS — I10 Essential (primary) hypertension: Secondary | ICD-10-CM

## 2012-08-15 MED ORDER — FLUOCINONIDE-E 0.05 % EX CREA: TOPICAL_CREAM | Freq: Two times a day (BID) | CUTANEOUS | Status: DC

## 2012-08-15 NOTE — Patient Instructions (Addendum)
The leg rash acts like a contact dermatitis   Such as poison ivy or other .  Sometimes   A new medication can cause a rash all over .  I see no signs of infection.  Normally I would use prednisone but since you had a side effect to this we should use around the clock antihistamine   Zyrtec every day  And add oin benadryl 25 mg every 6 hours as needed for itching and rash.  Also add the ranitidine to take twice a day for 1-2 weeks until rash subsides.  Should check BP  In the next few weeks to make sure  Coming down .

## 2012-08-15 NOTE — Progress Notes (Signed)
Chief Complaint  Patient presents with  . Rash    Arms, legs and abdomen.    HPI: Patient comes in today for  new problem evaluation.  Onset 3 weeks ago  Of left tight rash  Very itchy though a spider bite and cleaned bed area   No pain there very ichy using etoh and eventually hc otc  Getting better?  Now getting rashes other areas    Asks about shingles about the dx  This weekend  Itching  Rash all over forearms top of feet  Trunk near waistline   pantyline  Feels feverish hot and cold  Has hot flushes since been off of HRT  No documented fever or infection  Ask sif antibotic would help?  Has had some of the has back with vision change.   Cant take  Antidepressants  For other erasons and hasnt used for flushes   Root canal 2 weeks.  Ago no meds except lidocaine   No antibiotic  Took a mucinex   No help  To go back to dr Ladona Ridgel  Alisha Brown take tenormin and tenolol but wa ab;le to to lopressor in the past..just a low heart rated  ROS: See pertinent positives and negatives per HPI. No cough NVD or wheezing  No tick bite new foods  no UTI symptoms.  Past Medical History  Diagnosis Date  . CHF (congestive heart failure)   . Hypertension   . Hyperlipidemia   . [id number](478.2)     consult dr Smiley Houseman visual migraine?  agg by HRT?  [name] Esophageal reflux   . Hiatal hernia   . Allergy   . Anemia   . Anxiety   . Heart murmur   . Seizures     Family History  Problem Relation Age of Onset  . Diabetes Mother   . Diabetes Son   . Breast cancer    . Colon cancer Neg Hx   . Breast cancer Maternal Grandmother     History   Social History  . Marital Status: Married    Spouse Name: N/A    Number of Children: 1  . Years of Education: N/A   Occupational History  . retired    Social History Main Topics  . Smoking status: Never Smoker   . Smokeless tobacco: Never Used  . Alcohol Use: No     Comment: rarely  . Drug Use: No  . Sexually Active: None   Other Topics Concern  . None    Social History Narrative   HHof 2 dog    Just  Died   Egbert Garibaldi    Retired and married   [name]      Dog with health issues  Cooks for him bladder stones    Outpatient Encounter Prescriptions as of 08/15/2012  Medication Sig Dispense Refill  . ALPRAZolam (XANAX) 0.25 MG tablet Take 1 tablet (0.25 mg total) by mouth 3 (three) times daily as needed for anxiety.  24 tablet  0  . aspirin 81 MG tablet Take 81 mg by mouth daily.      . Calcium Carbonate-Vitamin D 600-400 MG-UNIT per tablet Take 1 tablet by mouth every other day.       . esomeprazole (NEXIUM) 40 MG capsule Take 1 capsule (40 mg total) by mouth 2 (two) times daily.  60 capsule  11  . Krill Oil 500 MG CAPS Take 1 capsule by mouth daily.      [name] MAGNESIUM PO Take by mouth.      [name]  Methylsulfonylmethane (MSM) 1000 MG CAPS Take 1,000 mg by mouth every other day.       . Multiple Vitamin (MULTIVITAMIN) tablet Take 1 tablet by mouth daily.        . vitamin B-12 (CYANOCOBALAMIN) 500 MCG tablet Take 500 mcg by mouth daily.        . fluocinonide-emollient (LIDEX-E) 0.05 % cream Apply topically 2 (two) times daily. Until better  Not on face  60 g  1  . [DISCONTINUED] aspirin 81 MG chewable tablet Chew 81 mg by mouth daily.      . [DISCONTINUED] OVER THE COUNTER MEDICATION        No facility-administered encounter medications on file as of 08/15/2012.    EXAM:  BP 148/80  Pulse 88  Temp(Src) 97.8 F (36.6 C) (Oral)  Wt 152 lb (68.947 kg)  BMI 25.29 kg/m2  SpO2 99%  Body mass index is 25.29 kg/(m^2).  GENERAL: vitals reviewed and listed above, alert, oriented, appears well hydrated and in no acute distress face clear without edema HEENT: atraumatic, conjunctiva  clear, no obvious abnormalities on inspection of external nose and ears OP : no lesion edema or exudate  NECK: no obvious masses on inspection palpation no adenopathy LUNGS: clear to auscultation bilaterally, no wheezes, rales or rhonchi, good air movement CV: HRRR, no  clubbing cyanosis or  peripheral edema nl cap refill  MS: moves all extremities without noticeable focal  abnormality Skin: Left thigh with about a 6 cm area of irregular linear contact dermatitis-like lesion very red no discharge induration or tenderness. Patch on right forearm right trunk around right. Waist band and in the area and mid back that blanches more confluent. Palms are clear some of these lesions blanch. PSYCH: pleasant and cooperative,    ASSESSMENT AND PLAN:  Discussed the following assessment and plan:  Allergic dermatitis - Leg looks like contact dermatitis the rest possibly early versus early hive-like reaction   HYPERTENSION  MENOPAUSE-RELATED VASOMOTOR SYMPTOMS, HOT FLASHES - problematic off hrt  vision change still come back.   lot a lot of options that she can take  ied gaba[entin and ssris. etc  will follow I see no evidence of shingles or bacterial infection at this time. We discussed taking prednisone but she states that it made her purple in the face and she had a reaction to it in the past so we'll use high potency topical and around-the-clock antihistamines. May need to see dermatologist if persistent progressive.   -Patient advised to return or notify health care team  if symptoms worsen or persist or new concerns arise.  Patient Instructions  The leg rash acts like a contact dermatitis   Such as poison ivy or other .  Sometimes   A new medication can cause a rash all over .  I see no signs of infection.  Normally I would use prednisone but since you had a side effect to this we should use around the clock antihistamine   Zyrtec every day  And add oin benadryl 25 mg every 6 hours as needed for itching and rash.  Also add the ranitidine to take twice a day for 1-2 weeks until rash subsides.  Should check BP  In the next few weeks to make sure  Coming down .    Alisha Brown M.D.

## 2012-08-31 ENCOUNTER — Encounter: Payer: Self-pay | Admitting: Internal Medicine

## 2012-08-31 ENCOUNTER — Ambulatory Visit (INDEPENDENT_AMBULATORY_CARE_PROVIDER_SITE_OTHER): Payer: Medicare Other | Admitting: Internal Medicine

## 2012-08-31 VITALS — BP 132/78 | HR 78 | Ht 65.0 in | Wt 151.4 lb

## 2012-08-31 DIAGNOSIS — R002 Palpitations: Secondary | ICD-10-CM

## 2012-08-31 DIAGNOSIS — I10 Essential (primary) hypertension: Secondary | ICD-10-CM

## 2012-08-31 MED ORDER — METOPROLOL TARTRATE 25 MG PO TABS: 25.0000 mg | ORAL_TABLET | Freq: Two times a day (BID) | ORAL | Status: DC

## 2012-08-31 NOTE — Patient Instructions (Addendum)
Your physician recommends that you schedule a follow-up appointment  As needed with Dr. Ladona Ridgel  Your physician has recommended you make the following change in your medication:  Start metoprolol tartrate 12. 5 mg by mouth twice daily. After one month increase to metoprolol tartrate 25 mg by mouth twice daily

## 2012-09-01 ENCOUNTER — Encounter: Payer: Self-pay | Admitting: Internal Medicine

## 2012-09-01 NOTE — Progress Notes (Signed)
HPI Alisha Brown returns today for followup. She has a history of symptomatic tachycardia palpitations, and hypertension in the past. She is been off of beta blocker therapy for over a year. Several months ago, she began to have visual changes and was ultimately diagnosed with atypical migraines. While undergoing GI evaluation, she was noted to have palpitations. She is referred for additional valuation. She has had no syncope. She denies chest pain or shortness of breath. There is no exacerbating conditions associated with her palpitations and they can happen spontaneously and are without exertion. Allergies  Allergen Reactions  . Codeine   . Gabapentin   . Propoxyphene-Acetaminophen     REACTION: hives,dizzy,gi upset     Current Outpatient Prescriptions  Medication Sig Dispense Refill  . ALPRAZolam (XANAX) 0.25 MG tablet Take 1 tablet (0.25 mg total) by mouth 3 (three) times daily as needed for anxiety.  24 tablet  0  . aspirin 81 MG tablet Take 81 mg by mouth daily.      . Calcium Carbonate-Vitamin D 600-400 MG-UNIT per tablet Take 1 tablet by mouth every other day.       . esomeprazole (NEXIUM) 40 MG capsule Take 1 capsule (40 mg total) by mouth 2 (two) times daily.  60 capsule  11  . fluocinonide-emollient (LIDEX-E) 0.05 % cream Apply topically 2 (two) times daily. Until better  Not on face  60 g  1  . Krill Oil 500 MG CAPS Take 1 capsule by mouth daily.      [name] MAGNESIUM PO Take by mouth.      . Methylsulfonylmethane (MSM) 1000 MG CAPS Take 1,000 mg by mouth every other day.       . Multiple Vitamin (MULTIVITAMIN) tablet Take 1 tablet by mouth daily.        . vitamin B-12 (CYANOCOBALAMIN) 500 MCG tablet Take 500 mcg by mouth daily.        . metoprolol tartrate (LOPRESSOR) 25 MG tablet Take 1 tablet (25 mg total) by mouth 2 (two) times daily.  60 tablet  11  . [DISCONTINUED] verapamil (COVERA HS) 180 MG (CO) 24 hr tablet Take 180 mg by mouth at bedtime.         No current  facility-administered medications for this visit.     Past Medical History  Diagnosis Date  . CHF (congestive heart failure)   . Hypertension   . Hyperlipidemia   . ZOXWRUEA(540.9)     consult dr Smiley Houseman visual migraine?  agg by HRT?  [name] Esophageal reflux   . Hiatal hernia   . Allergy   . Anemia   . Anxiety   . Heart murmur   . Seizures     ROS:   All systems reviewed and negative except as noted in the HPI.   Past Surgical History  Procedure Laterality Date  . Breast surgery  1990    biopsy  . Abdominal hysterectomy  1987  . Appendectomy  1987  . Tonsillectomy  1953  . Adenoidectomy    . Foot surgery--right  2011     Family History  Problem Relation Age of Onset  . Diabetes Mother   . Diabetes Son   . Breast cancer    . Colon cancer Neg Hx   . Breast cancer Maternal Grandmother      History   Social History  . Marital Status: Married    Spouse Name: N/A    Number of Children: 1  . Years of Education: N/A  Occupational History  . retired    Social History Main Topics  . Smoking status: Never Smoker   . Smokeless tobacco: Never Used  . Alcohol Use: No     Comment: rarely  . Drug Use: No  . Sexually Active: Not on file   Other Topics Concern  . Not on file   Social History Narrative   HHof 2 dog    Just  Died   Egbert Garibaldi    Retired and married   [name]      Dog with health issues  Cooks for him bladder stones     BP 132/78  Pulse 78  Ht 5\' 5"  (1.651 m)  Wt 151 lb 6.4 oz (68.675 kg)  BMI 25.19 kg/m2  Physical Exam:  Well appearing 72 year old woman, NAD HEENT: Unremarkable Neck:  6 cm JVD, no thyromegally Lungs:  Clear with no wheezes, rales, or rhonchi. HEART:  Regular rate rhythm, no murmurs, no rubs, no clicks Abd:  soft, positive bowel sounds, no organomegally, no rebound, no guarding Ext:  2 plus pulses, no edema, no cyanosis, no clubbing Skin:  No rashes no nodules Neuro:  CN II through XII intact, motor grossly intact  EKG -  normal sinus rhythm   Assess/Plan:

## 2012-09-01 NOTE — Assessment & Plan Note (Signed)
Her blood pressure is slightly elevated. Hopefully her low-dose beta blocker will help control her blood pressure.

## 2012-09-01 NOTE — Assessment & Plan Note (Signed)
The patient's symptoms appear to have increased. We will restart her on low-dose beta blocker therapy. Hopefully they will be controlled with his medication.

## 2012-11-08 ENCOUNTER — Ambulatory Visit
Admission: RE | Admit: 2012-11-08 | Discharge: 2012-11-08 | Disposition: A | Discharge status: Home / Self Care | Payer: Self-pay | Source: Ambulatory Visit | Attending: Internal Medicine | Admitting: Internal Medicine

## 2012-11-08 DIAGNOSIS — E041 Nontoxic single thyroid nodule: Secondary | ICD-10-CM

## 2012-11-24 ENCOUNTER — Ambulatory Visit (INDEPENDENT_AMBULATORY_CARE_PROVIDER_SITE_OTHER): Payer: Medicare Other

## 2012-11-24 DIAGNOSIS — Z23 Encounter for immunization: Secondary | ICD-10-CM

## 2012-12-07 ENCOUNTER — Other Ambulatory Visit: Payer: Self-pay

## 2012-12-07 DIAGNOSIS — Z1231 Encounter for screening mammogram for malignant neoplasm of breast: Secondary | ICD-10-CM

## 2013-01-04 ENCOUNTER — Ambulatory Visit
Admission: RE | Admit: 2013-01-04 | Discharge: 2013-01-04 | Disposition: A | Discharge status: Home / Self Care | Payer: Self-pay | Source: Ambulatory Visit

## 2013-01-04 DIAGNOSIS — Z1231 Encounter for screening mammogram for malignant neoplasm of breast: Secondary | ICD-10-CM

## 2013-01-12 ENCOUNTER — Telehealth: Payer: Self-pay | Admitting: Gastroenterology

## 2013-01-12 MED ORDER — ESOMEPRAZOLE MAGNESIUM 40 MG PO CPDR: 40.0000 mg | DELAYED_RELEASE_CAPSULE | Freq: Two times a day (BID) | ORAL | Status: DC

## 2013-01-12 NOTE — Telephone Encounter (Signed)
Called patient back and patient wanted to know when she is due for an office visit. I advised patient that she is due for follow up in December but she can come in first of the year in January for an appointment. I advised patient to call back because the schedule will be out then. Patient verbalized understanding. Patient requested Nexium because she will not have coverage and cannot afford the OTC Nexium, advised patient I can leave samples up front for her to get her through to her appointment. Patient will be here tomorrow to pick up samples.

## 2013-02-10 ENCOUNTER — Telehealth: Payer: Self-pay | Admitting: Internal Medicine

## 2013-02-10 ENCOUNTER — Encounter: Payer: Self-pay | Admitting: *Deleted

## 2013-02-10 NOTE — Telephone Encounter (Signed)
Pt received notice from [employer] stating she needs referrals for the following specialist:  Dr. Sharlett Iles, [facility] Dr. Lovena Le, [facility] Dr. Jari Pigg, Dermatology  [employer] ID # [id number]

## 2013-02-16 ENCOUNTER — Ambulatory Visit (INDEPENDENT_AMBULATORY_CARE_PROVIDER_SITE_OTHER): Payer: Medicare HMO | Admitting: Gastroenterology

## 2013-02-16 ENCOUNTER — Encounter: Payer: Self-pay | Admitting: Gastroenterology

## 2013-02-16 VITALS — BP 110/62 | HR 64 | Ht 65.0 in | Wt 152.4 lb

## 2013-02-16 DIAGNOSIS — K219 Gastro-esophageal reflux disease without esophagitis: Secondary | ICD-10-CM

## 2013-02-16 DIAGNOSIS — K5730 Diverticulosis of large intestine without perforation or abscess without bleeding: Secondary | ICD-10-CM

## 2013-02-16 MED ORDER — ESOMEPRAZOLE MAGNESIUM 40 MG PO CPDR: 40.0000 mg | DELAYED_RELEASE_CAPSULE | Freq: Two times a day (BID) | ORAL | Status: DC

## 2013-02-16 NOTE — Progress Notes (Signed)
This is a 73 year old Caucasian female with chronic GERD doing well on daily Nexium 40 mg.  She denies any reflux symptoms or dysphagia.  She did have an episode of what sounds like subacute diverticulitis several months ago with lower abdominal pain and discomfort.  She did not see a doctor or use antibiotics.  Previous colonoscopy showed evidence of diverticulosis but no colon polyps.  As was in December 2013 and she did have severe diverticulosis.  Appetite is good her weight is stable.  She denies hepatobiliary or general medical problems otherwise.  She does not use alcohol, cigarettes, or NSAIDs.  Family history is noncontributory.  Current Medications, Allergies, Past Medical History, Past Surgical History, Family History and Social History were reviewed in [employer] record.  ROS: All systems were reviewed and are negative unless otherwise stated in the HPI.          Physical Exam: Healthy-appearing patient in no distress.  Blood pressure 110/62, pulse 64 and regular and weight 152 with a BMI of 25.36.  There is no hepatosplenomegaly, abdominal masses or significant tenderness at this time.  Bowel sounds are normal.  Mental status is normal.    Assessment and Plan: Her acid reflux is doing well on daily PPI therapy.  I've suggested she use Pepcid AC for breakthrough symptoms.  I placed her on high fiber diet with Benefiber 1 tablespoon twice a day per her symptomatic diverticulosis.  We'll see her on a when necessary basis as needed.

## 2013-02-16 NOTE — Patient Instructions (Signed)
Please follow up in one year  Please purchase Benefiber over the counter and use twice daily, can sprinkle in food   You can purchase Pepcid-AC over the counter and use as needed   New prescription for Nexium was sent to your pharmacy   Information on Diverticulosis is below for your review _________________________________________________________________________________________________________________________  Diverticulosis Diverticulosis is a common condition that develops when small pouches (diverticula) form in the wall of the colon. The risk of diverticulosis increases with age. It happens more often in people who eat a low-fiber diet. Most individuals with diverticulosis have no symptoms. Those individuals with symptoms usually experience abdominal pain, constipation, or loose stools (diarrhea). HOME CARE INSTRUCTIONS   Increase the amount of fiber in your diet as directed by your caregiver or dietician. This may reduce symptoms of diverticulosis.  Your caregiver may recommend taking a dietary fiber supplement.  Drink at least 6 to 8 glasses of water each day to prevent constipation.  Try not to strain when you have a bowel movement.  Your caregiver may recommend avoiding nuts and seeds to prevent complications, although this is still an uncertain benefit.  Only take over-the-counter or prescription medicines for pain, discomfort, or fever as directed by your caregiver. FOODS WITH HIGH FIBER CONTENT INCLUDE:  Fruits. Apple, peach, pear, tangerine, raisins, prunes.  Vegetables. Brussels sprouts, asparagus, broccoli, cabbage, carrot, cauliflower, romaine lettuce, spinach, summer squash, tomato, winter squash, zucchini.  Starchy Vegetables. Baked beans, kidney beans, lima beans, split peas, lentils, potatoes (with skin).  Grains. Whole wheat bread, brown rice, bran flake cereal, plain oatmeal, white rice, shredded wheat, bran muffins. SEEK IMMEDIATE MEDICAL CARE IF:   You  develop increasing pain or severe bloating.  You have an oral temperature above 102 F (38.9 C), not controlled by medicine.  You develop vomiting or bowel movements that are bloody or black. Document Released: 10/10/2003 Document Revised: 04/06/2011 Document Reviewed: 06/12/2009 [facility] Patient Information 2014 [place].

## 2013-02-24 ENCOUNTER — Encounter: Payer: Self-pay | Admitting: *Deleted

## 2013-02-24 ENCOUNTER — Telehealth: Payer: Self-pay | Admitting: *Deleted

## 2013-02-24 NOTE — Telephone Encounter (Signed)
Recieved via fax from [facility] prior auth needed for Nexium  Filled out and faxed back

## 2013-03-02 NOTE — Telephone Encounter (Signed)
Completed.

## 2013-03-22 ENCOUNTER — Ambulatory Visit (INDEPENDENT_AMBULATORY_CARE_PROVIDER_SITE_OTHER): Payer: Medicare HMO | Admitting: Internal Medicine

## 2013-03-22 ENCOUNTER — Encounter: Payer: Self-pay | Admitting: Internal Medicine

## 2013-03-22 VITALS — BP 122/80 | HR 90 | Temp 97.6°F | Ht 65.0 in | Wt 148.0 lb

## 2013-03-22 DIAGNOSIS — Z8719 Personal history of other diseases of the digestive system: Secondary | ICD-10-CM

## 2013-03-22 DIAGNOSIS — R454 Irritability and anger: Secondary | ICD-10-CM

## 2013-03-22 DIAGNOSIS — E2839 Other primary ovarian failure: Secondary | ICD-10-CM

## 2013-03-22 DIAGNOSIS — R519 Headache, unspecified: Secondary | ICD-10-CM | POA: Insufficient documentation

## 2013-03-22 DIAGNOSIS — R51 Headache: Secondary | ICD-10-CM

## 2013-03-22 DIAGNOSIS — Z87898 Personal history of other specified conditions: Secondary | ICD-10-CM

## 2013-03-22 MED ORDER — ESCITALOPRAM OXALATE 5 MG PO TABS: 2.5000 mg | ORAL_TABLET | Freq: Every day | ORAL | Status: DC

## 2013-03-22 NOTE — Patient Instructions (Signed)
Would wait another week  Or so and if sinus pain continues contact us and we  Will  Consider a sinus ct scan.   Consider  Low dose  SSRI  Such as lexapro for hot flushes it  can also help mood )  Would have to take for weeks  To see if helps .  keep app t in April .

## 2013-03-22 NOTE — Progress Notes (Signed)
Chief Complaint  Patient presents with  . Hot Flashes    Taking a Z-pak for a sinus infection.    HPI: Patient comes in today for SDA appointment followup for his last difficult 6 months. Still having some hot flushes Dentist made  wrong bridge and stress i an issue and  had elevaetd bp and put on lopressor and caused sleep distrubance staid awake and had to stop that but feels blood pressure was elevated because of stress Saw Endodontist and had root canals  Then go panic alone and had jaw and gum laceration.  Dental  Is stable now but ? If needs tooth to take out.  Good dentist in [place]. Have been having teeth pain and face pain and was given antibiotics for possible sinus infection unclear what x-rays were done And now had 2 z packs  . Teeth pain and sinus area are getting better but still has symptoms. And headaches go better .  Last tablet for second dose  . Sneezing green.  At times but no fever Nasty  .   Mood in the past few months which is bothering her Asks if she could have a strep infection in her head she has no fever or sore throat.   Her last check with cardiology was told that she could be seen on an as-needed basis that she should do well. .  ROS: See pertinent positives and negatives per HPI. No current chest pain shortness of breath no syncope states she is very sensitive to medications. 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 She did see neurology in the past who felt the same she still gets these spells but they are much less frequent and only occasional.  Past Medical History  Diagnosis Date  . CHF (congestive heart failure)   . Hypertension   . Hyperlipidemia   . QIONGEXB(284.1)     consult dr Tomma Rakers visual migraine?  agg by HRT?  . GERD with stricture   . Hiatal hernia   . Allergy   . Anemia   . Anxiety   . Heart murmur   . Seizures   . Diverticulosis of  colon (without mention of hemorrhage)     Family History  Problem Relation Age of Onset  . Diabetes Mother   . Diabetes Son   . Breast cancer    . Colon cancer Neg Hx   . Breast cancer Maternal Grandmother     History   Social History  . Marital Status: Married    Spouse Name: N/A    Number of Children: 1  . Years of Education: N/A   Occupational History  . retired    Social History Main Topics  . Smoking status: Never Smoker   . Smokeless tobacco: Never Used  . Alcohol Use: No     Comment: rarely  . Drug Use: No  . Sexual Activity: None   Other Topics Concern  . None   Social History Narrative   HHof 2 dog    Just  Died   [name]    Retired and married   [name]      Dog with health issues  Cooks for him bladder stones    Outpatient Encounter Prescriptions as of 03/22/2013  Medication Sig  . esomeprazole (NEXIUM) 40 MG capsule Take 1 capsule (40 mg total) by mouth 2 (two) times daily.  Javier Docker  Oil 500 MG CAPS Take 1 capsule by mouth daily.  [name] MAGNESIUM PO Take by mouth.  . Methylsulfonylmethane (MSM) 1000 MG CAPS Take 1,000 mg by mouth every other day.   . Multiple Vitamin (MULTIVITAMIN) tablet Take 1 tablet by mouth daily.    . vitamin B-12 (CYANOCOBALAMIN) 500 MCG tablet Take 500 mcg by mouth daily.    [name] escitalopram (LEXAPRO) 5 MG tablet Take 0.5-1 tablets (2.5-5 mg total) by mouth daily.  . [DISCONTINUED] Calcium Carbonate-Vitamin D 600-400 MG-UNIT per tablet Take 1 tablet by mouth every other day.     EXAM:  BP 122/80  Pulse 90  Temp(Src) 97.6 F (36.4 C) (Oral)  Ht 5\' 5"  (1.651 m)  Wt 148 lb (67.132 kg)  BMI 24.63 kg/m2  SpO2 97%  Body mass index is 24.63 kg/(m^2).  GENERAL: vitals reviewed and listed above, alert, oriented, appears well hydrated and in no acute distress she appears a bit tired minimally congested  HEENT: atraumatic, conjunctiva  clear, no obvious abnormalities on inspection of external nose and ears nares patent OP : no lesion  edema or exudate no exquisite pain but some tenderness in the maxillary area NECK: no obvious masses on inspection palpation no JVD or adenopathy LUNGS: clear to auscultation bilaterally, no wheezes, rales or rhonchi, good air movement CV: HRRR, no clubbing cyanosis or  peripheral edema nl cap refill MS: moves all extremities without noticeable focal  abnormality PSYCH: pleasant and cooperative, looks stressed slightly down normal speech and attention. Lab Results  Component Value Date   WBC 7.2 05/30/2012   HGB 13.9 05/30/2012   HCT 41.2 05/30/2012   PLT 218.0 05/30/2012   GLUCOSE 87 05/30/2012   CHOL 254* 05/30/2012   TRIG 89.0 05/30/2012   HDL 76.30 05/30/2012   LDLDIRECT 169.1 05/30/2012   ALT 20 05/30/2012   AST 19 05/30/2012   NA 138 05/30/2012   K 4.6 05/30/2012   CL 104 05/30/2012   CREATININE 0.9 05/30/2012   BUN 14 05/30/2012   CO2 28 05/30/2012   TSH 1.44 05/30/2012   INR 0.9 RATIO 03/29/2007    ASSESSMENT AND PLAN:  Discussed the following assessment and plan:  Estrogen deficiency - Plan: DG Bone Density  Face pain - Under treatment for sinusitis with azithromycin which may be suboptimal but did improve other causes of pain are possible finish Z-Pak ifneeded get sinus ct   Irritability - poss related to med condition hot flushes etc  History of dental problems He is quite sensitive to many medications however I think a combination SSRI is appropriate in the low dose. These have been used for hot flashes sleep anxiety depression that is overlaid here. Go very low dose increase is possible but don't start until the sinuses are improved. Consider sinus CT and/or ENT evaluation before adding another antibiotic. Discuss possibility of repeating her thyroid tests however she is due for full lab tests and April we'll wait for this I doubt that is the problem. -Patient advised to return or notify health care team  if symptoms worsen or persist or new concerns arise.  Patient Instructions  Would wait  another week  Or so and if sinus pain continues contact us and we  Will  Consider a sinus ct scan.   Consider  Low dose  SSRI  Such as lexapro for hot flushes it  can also help mood )  Would have to take for weeks  To see if helps .  keep app t in  April .   Standley Brooking. Sargent Mankey M.D.  Pre visit review using our clinic review tool, if applicable. No additional management support is needed unless otherwise documented below in the visit note. Total visit 8mins > 50% spent counseling and coordinating care    Advice sign out for my chart of possible.

## 2013-03-31 ENCOUNTER — Other Ambulatory Visit: Payer: Self-pay | Admitting: Family Medicine

## 2013-03-31 ENCOUNTER — Telehealth: Payer: Self-pay | Admitting: Internal Medicine

## 2013-03-31 DIAGNOSIS — R519 Headache, unspecified: Secondary | ICD-10-CM

## 2013-03-31 DIAGNOSIS — R51 Headache: Principal | ICD-10-CM

## 2013-03-31 DIAGNOSIS — J3489 Other specified disorders of nose and nasal sinuses: Secondary | ICD-10-CM

## 2013-03-31 NOTE — Telephone Encounter (Signed)
Left message on the pt's cell informing her that I have placed the order for her CT scan and she should hear from our office soon.

## 2013-03-31 NOTE — Telephone Encounter (Signed)
Pt called back to make you aware that she is not any better, she is still having really bad headaches, her temples hurt and itch and her neck is tender.  Pt states that having a sinus ct scan was discussed at last visit if not feeling any better.  Pt would like to proceed with ordering this.  Please order/advise.

## 2013-04-04 ENCOUNTER — Other Ambulatory Visit: Payer: Self-pay | Admitting: Family Medicine

## 2013-04-04 ENCOUNTER — Telehealth: Payer: Self-pay | Admitting: Internal Medicine

## 2013-04-04 DIAGNOSIS — R519 Headache, unspecified: Secondary | ICD-10-CM

## 2013-04-04 DIAGNOSIS — J3489 Other specified disorders of nose and nasal sinuses: Secondary | ICD-10-CM

## 2013-04-04 DIAGNOSIS — R51 Headache: Secondary | ICD-10-CM

## 2013-04-04 NOTE — Telephone Encounter (Signed)
Pt calling to check the status of appt for CT scan that was ordered on 03/31/13.

## 2013-04-05 ENCOUNTER — Ambulatory Visit (INDEPENDENT_AMBULATORY_CARE_PROVIDER_SITE_OTHER)
Admission: RE | Admit: 2013-04-05 | Discharge: 2013-04-05 | Disposition: A | Discharge status: Home / Self Care | Payer: Medicare HMO | Source: Ambulatory Visit | Attending: Internal Medicine | Admitting: Internal Medicine

## 2013-04-05 DIAGNOSIS — E2839 Other primary ovarian failure: Secondary | ICD-10-CM

## 2013-04-05 NOTE — Telephone Encounter (Signed)
appt made, pt aware

## 2013-04-07 ENCOUNTER — Ambulatory Visit (INDEPENDENT_AMBULATORY_CARE_PROVIDER_SITE_OTHER)
Admission: RE | Admit: 2013-04-07 | Discharge: 2013-04-07 | Disposition: A | Discharge status: Home / Self Care | Payer: Medicare HMO | Source: Ambulatory Visit | Attending: Internal Medicine | Admitting: Internal Medicine

## 2013-04-07 DIAGNOSIS — J3489 Other specified disorders of nose and nasal sinuses: Secondary | ICD-10-CM

## 2013-04-07 DIAGNOSIS — R519 Headache, unspecified: Secondary | ICD-10-CM

## 2013-04-07 DIAGNOSIS — R51 Headache: Secondary | ICD-10-CM

## 2013-04-14 ENCOUNTER — Telehealth: Payer: Self-pay

## 2013-04-14 ENCOUNTER — Encounter: Payer: Self-pay | Admitting: Internal Medicine

## 2013-04-14 NOTE — Telephone Encounter (Signed)
Left a message at the below listed number for the pt to return my call. 

## 2013-04-14 NOTE — Telephone Encounter (Signed)
Pt req call back about ct scan results

## 2013-04-17 ENCOUNTER — Other Ambulatory Visit: Payer: Self-pay | Admitting: Family Medicine

## 2013-04-17 DIAGNOSIS — J329 Chronic sinusitis, unspecified: Secondary | ICD-10-CM

## 2013-04-17 DIAGNOSIS — J3489 Other specified disorders of nose and nasal sinuses: Secondary | ICD-10-CM

## 2013-04-17 NOTE — Telephone Encounter (Signed)
Spoke to the pt and gave her results of lab work and bone density.  Referral placed for ENT.

## 2013-04-26 ENCOUNTER — Other Ambulatory Visit (INDEPENDENT_AMBULATORY_CARE_PROVIDER_SITE_OTHER): Payer: Medicare HMO

## 2013-04-26 DIAGNOSIS — Z0000 Encounter for general adult medical examination without abnormal findings: Secondary | ICD-10-CM

## 2013-04-26 LAB — TSH: TSH: 0.92 u[IU]/mL (ref 0.35–5.50)

## 2013-04-26 LAB — BASIC METABOLIC PANEL
BUN: 18 mg/dL (ref 6–23)
CO2: 29 mEq/L (ref 19–32)
Calcium: 9.5 mg/dL (ref 8.4–10.5)
Chloride: 103 mEq/L (ref 96–112)
Creatinine, Ser: 1 mg/dL (ref 0.4–1.2)
GFR: 58.85 mL/min — AB (ref >60.00)
Glucose, Bld: 93 mg/dL (ref 70–99)
POTASSIUM: 4.2 meq/L (ref 3.5–5.1)
SODIUM: 139 meq/L (ref 135–145)

## 2013-04-26 LAB — CBC WITH DIFFERENTIAL/PLATELET
Basophils Absolute: 0 10*3/uL (ref 0.0–0.1)
Basophils Relative: 0.8 % (ref 0.0–3.0)
EOS PCT: 3.3 % (ref 0.0–5.0)
Eosinophils Absolute: 0.2 10*3/uL (ref 0.0–0.7)
HEMATOCRIT: 42 % (ref 36.0–46.0)
HEMOGLOBIN: 13.6 g/dL (ref 12.0–15.0)
LYMPHS ABS: 2.4 10*3/uL (ref 0.7–4.0)
Lymphocytes Relative: 43.2 % (ref 12.0–46.0)
MCHC: 32.5 g/dL (ref 30.0–36.0)
MCV: 89.2 fl (ref 78.0–100.0)
MONOS PCT: 8.3 % (ref 3.0–12.0)
Monocytes Absolute: 0.5 10*3/uL (ref 0.1–1.0)
NEUTROS ABS: 2.5 10*3/uL (ref 1.4–7.7)
Neutrophils Relative %: 44.4 % (ref 43.0–77.0)
Platelets: 251 10*3/uL (ref 150.0–400.0)
RBC: 4.71 Mil/uL (ref 3.87–5.11)
RDW: 14.2 % (ref 11.5–14.6)
WBC: 5.6 10*3/uL (ref 4.5–10.5)

## 2013-04-26 LAB — LIPID PANEL
CHOL/HDL RATIO: 4
Cholesterol: 240 mg/dL — ABNORMAL HIGH (ref 0–200)
HDL: 56.3 mg/dL (ref >39.00)
LDL Cholesterol: 173 mg/dL — ABNORMAL HIGH (ref 0–99)
Triglycerides: 56 mg/dL (ref 0.0–149.0)
VLDL: 11.2 mg/dL (ref 0.0–40.0)

## 2013-04-26 LAB — HEPATIC FUNCTION PANEL
ALBUMIN: 4.3 g/dL (ref 3.5–5.2)
ALT: 19 U/L (ref 0–35)
AST: 18 U/L (ref 0–37)
Alkaline Phosphatase: 71 U/L (ref 39–117)
Bilirubin, Direct: 0 mg/dL (ref 0.0–0.3)
Total Bilirubin: 0.7 mg/dL (ref 0.3–1.2)
Total Protein: 7 g/dL (ref 6.0–8.3)

## 2013-05-03 ENCOUNTER — Telehealth: Payer: Self-pay | Admitting: Internal Medicine

## 2013-05-03 ENCOUNTER — Ambulatory Visit (INDEPENDENT_AMBULATORY_CARE_PROVIDER_SITE_OTHER): Payer: Commercial Managed Care - HMO | Admitting: Internal Medicine

## 2013-05-03 ENCOUNTER — Encounter: Payer: Self-pay | Admitting: Internal Medicine

## 2013-05-03 VITALS — BP 140/68 | HR 87 | Temp 97.9°F | Ht 64.75 in | Wt 147.0 lb

## 2013-05-03 DIAGNOSIS — F411 Generalized anxiety disorder: Secondary | ICD-10-CM

## 2013-05-03 DIAGNOSIS — Z23 Encounter for immunization: Secondary | ICD-10-CM

## 2013-05-03 DIAGNOSIS — N951 Menopausal and female climacteric states: Secondary | ICD-10-CM

## 2013-05-03 DIAGNOSIS — I10 Essential (primary) hypertension: Secondary | ICD-10-CM | POA: Diagnosis not present

## 2013-05-03 DIAGNOSIS — E2839 Other primary ovarian failure: Secondary | ICD-10-CM

## 2013-05-03 DIAGNOSIS — Z0000 Encounter for general adult medical examination without abnormal findings: Secondary | ICD-10-CM

## 2013-05-03 DIAGNOSIS — R51 Headache: Secondary | ICD-10-CM

## 2013-05-03 DIAGNOSIS — G4763 Sleep related bruxism: Secondary | ICD-10-CM

## 2013-05-03 DIAGNOSIS — E785 Hyperlipidemia, unspecified: Secondary | ICD-10-CM

## 2013-05-03 DIAGNOSIS — R519 Headache, unspecified: Secondary | ICD-10-CM | POA: Insufficient documentation

## 2013-05-03 DIAGNOSIS — I499 Cardiac arrhythmia, unspecified: Secondary | ICD-10-CM

## 2013-05-03 MED ORDER — LORAZEPAM 0.5 MG PO TABS: 0.5000 mg | ORAL_TABLET | Freq: Two times a day (BID) | ORAL | Status: DC | PRN

## 2013-05-03 NOTE — Patient Instructions (Addendum)
Ok to use the lorazepam  Low dose as needed .  Agree to hold for now on the lexapro  Until stable    Cholesterol needs to be better .  Continue steps to  Help . this diet and activity   prevnar today  bp is up  Recheck at home to make sure below 150 range .  Your exam is good today . Continue lifestyle intervention healthy eating and exercise .    Per Beers list, older adults have increased sensitivity to benzodiazepines and slower metabolism of long-acting agents. In general, all benzodiazepines increase risk of cognitive impairment, delirium, falls, fractures, and motor vehicle accidents in older adults. May be appropriate for seizure disorders, rapid eye movement sleep disorders, benzodiazepine withdrawal, ethanol withdrawal, severe generalized anxiety disorder, periprocedural anesthesia, end-of-life care. Avoid benzodiazepines (any type) for treatment of insomnia, agitation, or delirium. Because lorazepam is relatively short-acting with an inactive metabolite, it is a preferred agent to use in elderly patients when a benzodiazepine is indicated.   Copyright 2015 [employer] Version: 15.1.1.003 Expiration Date: 05/25/2013

## 2013-05-03 NOTE — Telephone Encounter (Signed)
Relevant patient education assigned to patient using Emmi. ° °

## 2013-05-03 NOTE — Progress Notes (Signed)
Pre visit review using our clinic review tool, if applicable. No additional management support is needed unless otherwise documented below in the visit note.   Chief Complaint  Patient presents with  . Wellness Exam    Insurance will not cover hearing exam.  Not performed today.    HPI: Patient comes in today for Preventive Medicare wellness visit . No major injuries, ed visits ,hospitalizations , new medications since last visit.  Stress  Having a hard time sleeping .  Elevated bp  Made big decision today  But a good one to travel summer to [place] to visit son   Tried ibu pm   Lorazepam alpraz 1/2   Not  A lot   Got chest pains  After beginning medication low dose lexapro  and hb also.  May have  Been getting sick and bad cold.  Only for 2 days.   Feels like bothered her   Uncertain if should try again.   Jaw tooth pain  tmj may get bit guard after teeth fixed no sig sinus disease per ent   Health Maintenance  Topic Date Due  . Influenza Vaccine  08/26/2013  . Mammogram  01/05/2015  . Tetanus/tdap  09/24/2021  . Colonoscopy  01/24/2022  . Pneumococcal Polysaccharide Vaccine Age 19 And Over  Completed  . Zostavax  Completed   Health Maintenance Review     Hearing:  Ok   Vision:  No limitations at present . Last eye check UTD  Safety:  Has smoke detector and wears seat belts.  No firearms. No excess sun exposure. Sees dentist regularly.  Falls:  no  Advance directive :  Reviewed  Has one.  Memory: Felt to be good  , no concern from her or her family.  Depression: No anhedonia unusual crying or depressive symptoms  Nutrition: Eats well balanced diet; adequate calcium and vitamin D. No swallowing chewing problems. Stopped baking  And got rid of junk food   Injury: no major injuries in the last six months.  Other healthcare providers:  Reviewed today .  Social:  Lives with spouse married. 1 special needs dog .   Preventive parameters: up-to-date  Reviewed   ADLS:    There are no problems or need for assistance  driving, feeding, obtaining food, dressing, toileting and bathing, managing money using phone. She is independent.  EXERCISE/ HABITS  Per week walks the dog  Feet limiting   No tobacco  no  etoh   ROS:  GEN/ HEENT: No fever, significant weight changes sweats headaches vision problems hearing changes, CV/ PULM; No chest pain shortness of breath cough, syncope,edema  change in exercise tolerance. GI /GU: No adominal pain, vomiting, change in bowel habits. No blood in the stool. No significant GU symptoms. SKIN/HEME: ,no acute skin rashes suspicious lesions or bleeding. No lymphadenopathy, nodules, masses.  NEURO/ PSYCH:  No neurologic signs such as weakness numbness. Stress anxiety. IMM/ Allergy: No unusual infections.  Allergy .   REST of 12 system review negative except as per HPI   Past Medical History  Diagnosis Date  . CHF (congestive heart failure)   . Hypertension   . Hyperlipidemia   . DXIPJASN(053.9)     consult dr Tomma Rakers visual migraine?  agg by HRT?  . GERD with stricture   . Hiatal hernia   . Allergy   . Anemia   . Anxiety   . Heart murmur   . Seizures   . Diverticulosis of colon (without mention of  hemorrhage)     Family History  Problem Relation Age of Onset  . Diabetes Mother   . Diabetes Son   . Breast cancer    . Colon cancer Neg Hx   . Breast cancer Maternal Grandmother     History   Social History  . Marital Status: Married    Spouse Name: N/A    Number of Children: 1  . Years of Education: N/A   Occupational History  . retired    Social History Main Topics  . Smoking status: Never Smoker   . Smokeless tobacco: Never Used  . Alcohol Use: No     Comment: rarely  . Drug Use: No  . Sexual Activity: None   Other Topics Concern  . None   Social History Narrative   HHof 2 dog    Just  Died   [name]    Retired and married   [name]      Dog with health issues  Cooks for him bladder stones     Outpatient Encounter Prescriptions as of 05/03/2013  Medication Sig  . esomeprazole (NEXIUM) 40 MG capsule Take 1 capsule (40 mg total) by mouth 2 (two) times daily.  Javier Docker Oil 500 MG CAPS Take 1 capsule by mouth daily.  [name] MAGNESIUM PO Take by mouth.  . Methylsulfonylmethane (MSM) 1000 MG CAPS Take 1,000 mg by mouth every other day.   . Multiple Vitamin (MULTIVITAMIN) tablet Take 1 tablet by mouth daily.    . vitamin B-12 (CYANOCOBALAMIN) 500 MCG tablet Take 500 mcg by mouth daily.    [name] LORazepam (ATIVAN) 0.5 MG tablet Take 1 tablet (0.5 mg total) by mouth 2 (two) times daily as needed for anxiety.  . [DISCONTINUED] escitalopram (LEXAPRO) 5 MG tablet Take 0.5-1 tablets (2.5-5 mg total) by mouth daily.    EXAM:  BP 140/68  Pulse 87  Temp(Src) 97.9 F (36.6 C) (Oral)  Ht 5' 4.75" (1.645 m)  Wt 147 lb (66.679 kg)  BMI 24.64 kg/m2  SpO2 97%  Body mass index is 24.64 kg/(m^2).  Physical Exam: Vital signs reviewed [id number] is a well-developed well-nourished alert cooperative   who appears stated age in no acute distress.  HEENT: normocephalic atraumatic , Eyes: PERRL EOM's full, conjunctiva clear, Nares: paten,t no deformity discharge or tenderness., Ears: no deformity EAC's clear TMs with normal landmarks. Mouth: clear OP, no lesions, edema.  Moist mucous membranes. Dentition in adequate repair. NECK: supple without masses, thyromegaly or bruits. CHEST/PULM:  Clear to auscultation and percussion breath sounds equal no wheeze , rales or rhonchi. No chest wall deformities or tenderness. Breast: normal by inspection . No dimpling, discharge, masses, tenderness or discharge . CV: PMI is nondisplaced, S1 S2 no gallops, murmurs, rubs. Peripheral pulses are full without delay.No JVD .  ABDOMEN: Bowel sounds normal nontender  No guard or rebound, no hepato splenomegal no CVA tenderness.  No hernia. Extremtities:  No clubbing cyanosis or edema, no acute joint swelling or redness no focal  atrophy NEURO:  Oriented x3, cranial nerves 3-12 appear to be intact, no obvious focal weakness,gait within normal limits no abnormal reflexes or asymmetrical SKIN: No acute rashes normal turgor, color, no bruising or petechiae. sk on back  PSYCH: Oriented, good eye contact,  Mild anxiety, cognition and judgment appear normal. LN: no cervical axillary inguinal adenopathy No noted deficits in memory, attention, and speech.   Lab Results  Component Value Date   WBC 5.6 04/26/2013   HGB 13.6 04/26/2013  HCT 42.0 04/26/2013   PLT 251.0 04/26/2013   GLUCOSE 93 04/26/2013   CHOL 240* 04/26/2013   TRIG 56.0 04/26/2013   HDL 56.30 04/26/2013   LDLDIRECT 169.1 05/30/2012   LDLCALC 173* 04/26/2013   ALT 19 04/26/2013   AST 18 04/26/2013   NA 139 04/26/2013   K 4.2 04/26/2013   CL 103 04/26/2013   CREATININE 1.0 04/26/2013   BUN 18 04/26/2013   CO2 29 04/26/2013   TSH 0.92 04/26/2013   INR 0.9 RATIO 03/29/2007    ASSESSMENT AND PLAN:  Discussed the following assessment and plan:  Visit for preventive health examination  Unspecified essential hypertension - up today dec on recheck ? anxiety follow  Menopausal hot flushes - ? if lexapro x 2 d cause se or now  may rechallenge in future   Estrogen deficiency  Facial pain - poss tmj  had ent eval   Need for prophylactic vaccination against Streptococcus pneumoniae (pneumococcus) - Plan: Pneumococcal conjugate vaccine 13-valent  Cardiac dysrhythmia, unspecified  Sleep related teeth grinding  Anxiety reaction  Elevated lipids Disc risk benefit of benzos short term only  Consider other methods if needed  Counseled regarding healthy nutrition, exercise, sleep, injury prevention, calcium vit d and healthy weight .  Patient Care Team: Burnis Medin, MD as PCP - General Evans Lance, MD (Cardiology) Sable Feil, MD (Gastroenterology) Jari Pigg, MD (Dermatology) DUNN  for eye exams   Patient Instructions  Ok to use the lorazepam  Low dose as needed  .  Agree to hold for now on the lexapro  Until stable    Cholesterol needs to be better .  Continue steps to  Help . this diet and activity   prevnar today  bp is up  Recheck at home to make sure below 150 range .  Your exam is good today . Continue lifestyle intervention healthy eating and exercise .    Per Beers list, older adults have increased sensitivity to benzodiazepines and slower metabolism of long-acting agents. In general, all benzodiazepines increase risk of cognitive impairment, delirium, falls, fractures, and motor vehicle accidents in older adults. May be appropriate for seizure disorders, rapid eye movement sleep disorders, benzodiazepine withdrawal, ethanol withdrawal, severe generalized anxiety disorder, periprocedural anesthesia, end-of-life care. Avoid benzodiazepines (any type) for treatment of insomnia, agitation, or delirium. Because lorazepam is relatively short-acting with an inactive metabolite, it is a preferred agent to use in elderly patients when a benzodiazepine is indicated.   Copyright 2015 [employer] Version: 15.1.1.003 Expiration Date: 05/25/2013      Standley Brooking. Panosh M.D.

## 2013-09-04 ENCOUNTER — Ambulatory Visit (INDEPENDENT_AMBULATORY_CARE_PROVIDER_SITE_OTHER): Payer: Medicare HMO | Admitting: Physician Assistant

## 2013-09-04 ENCOUNTER — Encounter: Payer: Self-pay | Admitting: Physician Assistant

## 2013-09-04 VITALS — BP 140/72 | HR 72 | Temp 97.7°F | Resp 18 | Wt 142.0 lb

## 2013-09-04 DIAGNOSIS — R300 Dysuria: Secondary | ICD-10-CM

## 2013-09-04 DIAGNOSIS — R5381 Other malaise: Secondary | ICD-10-CM

## 2013-09-04 DIAGNOSIS — R5383 Other fatigue: Secondary | ICD-10-CM

## 2013-09-04 DIAGNOSIS — R51 Headache: Secondary | ICD-10-CM

## 2013-09-04 LAB — COMPREHENSIVE METABOLIC PANEL
ALBUMIN: 4.3 g/dL (ref 3.5–5.2)
ALK PHOS: 74 U/L (ref 39–117)
ALT: 22 U/L (ref 0–35)
AST: 24 U/L (ref 0–37)
BUN: 22 mg/dL (ref 6–23)
CALCIUM: 9.5 mg/dL (ref 8.4–10.5)
CHLORIDE: 102 meq/L (ref 96–112)
CO2: 28 mEq/L (ref 19–32)
Creatinine, Ser: 0.9 mg/dL (ref 0.4–1.2)
GFR: 70.1 mL/min (ref >60.00)
Glucose, Bld: 76 mg/dL (ref 70–99)
POTASSIUM: 4.1 meq/L (ref 3.5–5.1)
SODIUM: 138 meq/L (ref 135–145)
TOTAL PROTEIN: 6.9 g/dL (ref 6.0–8.3)
Total Bilirubin: 0.7 mg/dL (ref 0.2–1.2)

## 2013-09-04 LAB — TSH: TSH: 1.03 u[IU]/mL (ref 0.35–4.50)

## 2013-09-04 LAB — POCT URINALYSIS DIPSTICK
BILIRUBIN UA: NEGATIVE
Blood, UA: NEGATIVE
Glucose, UA: NEGATIVE
KETONES UA: NEGATIVE
LEUKOCYTES UA: NEGATIVE
NITRITE UA: NEGATIVE
PROTEIN UA: NEGATIVE
Spec Grav, UA: 1.01
Urobilinogen, UA: 0.2
pH, UA: 5

## 2013-09-04 LAB — CBC WITH DIFFERENTIAL/PLATELET
BASOS ABS: 0 10*3/uL (ref 0.0–0.1)
BASOS PCT: 0.6 % (ref 0.0–3.0)
EOS ABS: 0.1 10*3/uL (ref 0.0–0.7)
Eosinophils Relative: 1.1 % (ref 0.0–5.0)
HCT: 40.9 % (ref 36.0–46.0)
Hemoglobin: 13.6 g/dL (ref 12.0–15.0)
LYMPHS PCT: 32.3 % (ref 12.0–46.0)
Lymphs Abs: 2.2 10*3/uL (ref 0.7–4.0)
MCHC: 33.1 g/dL (ref 30.0–36.0)
MCV: 89.8 fl (ref 78.0–100.0)
Monocytes Absolute: 0.5 10*3/uL (ref 0.1–1.0)
Monocytes Relative: 7.2 % (ref 3.0–12.0)
NEUTROS PCT: 58.8 % (ref 43.0–77.0)
Neutro Abs: 4.1 10*3/uL (ref 1.4–7.7)
PLATELETS: 223 10*3/uL (ref 150.0–400.0)
RBC: 4.56 Mil/uL (ref 3.87–5.11)
RDW: 14 % (ref 11.5–15.5)
WBC: 6.9 10*3/uL (ref 4.0–10.5)

## 2013-09-04 LAB — T4, FREE: Free T4: 0.89 ng/dL (ref 0.60–1.60)

## 2013-09-04 LAB — SEDIMENTATION RATE: Sed Rate: 6 mm/hr (ref 0–22)

## 2013-09-04 NOTE — Progress Notes (Signed)
Pre visit review using our clinic review tool, if applicable. No additional management support is needed unless otherwise documented below in the visit note. 

## 2013-09-04 NOTE — Progress Notes (Signed)
Subjective:    Patient ID: Alisha Brown, female    DOB: 08/08/1940, 73 y.o.   MRN: [id number]  Dysuria  This is a new problem. The current episode started more than 1 month ago. The problem occurs intermittently. The problem has been gradually worsening. The quality of the pain is described as burning (with intermittent stomach cramps). The pain is mild. There has been no fever. There is no history of pyelonephritis. Associated symptoms include chills, frequency, nausea (occasionally) and urgency. Pertinent negatives include no discharge, flank pain, hematuria, hesitancy, possible pregnancy, sweats or vomiting. She has tried increased fluids (cranberry juice) for the symptoms. The treatment provided no relief. Her past medical history is significant for kidney stones (1 stone many years ago). There is no history of catheterization, recurrent UTIs, a single kidney, urinary stasis or a urological procedure.  Headache  This is a chronic problem. The current episode started 1 to 4 weeks ago (about month). The problem occurs intermittently. The problem has been gradually worsening. The pain is located in the bilateral region. The pain radiates to the right neck and left neck. The pain quality is similar to prior headaches (similar to sinus headaches). The quality of the pain is described as band-like and throbbing. The pain is at a severity of 6/10. The pain is moderate. Associated symptoms include abdominal pain (abdominal cramping), blurred vision (occasionally), coughing (possibly GERD related, takes nexium.), dizziness, a loss of balance, muscle aches, nausea (occasionally), sinus pressure (more so today.), a sore throat (had one last week, but resolved.), tinnitus (has history of, currently seems worse.), weakness and weight loss. Pertinent negatives include no abnormal behavior, anorexia, back pain, drainage, ear pain, eye pain, eye redness, eye watering, facial sweating, fever, hearing loss, insomnia,  neck pain, numbness, phonophobia, photophobia, rhinorrhea, scalp tenderness, seizures, swollen glands, tingling, visual change or vomiting. Nothing aggravates the symptoms. She has tried NSAIDs for the symptoms. The treatment provided mild relief. Her past medical history is significant for migraine headaches. There is no history of cancer, cluster headaches, hypertension, immunosuppression, obesity, recent head traumas or sinus disease.      Review of Systems  Constitutional: Positive for chills, weight loss, appetite change (decreased) and fatigue. Negative for fever.  HENT: Positive for sinus pressure (more so today.), sore throat (had one last week, but resolved.) and tinnitus (has history of, currently seems worse.). Negative for congestion, ear pain, hearing loss, postnasal drip and rhinorrhea.   Eyes: Positive for blurred vision (occasionally). Negative for photophobia, pain and redness.  Respiratory: Positive for cough (possibly GERD related, takes nexium.). Negative for chest tightness and shortness of breath.   Cardiovascular: Negative for chest pain and palpitations.  Gastrointestinal: Positive for nausea (occasionally) and abdominal pain (abdominal cramping). Negative for vomiting, diarrhea, constipation, blood in stool and anorexia.  Genitourinary: Positive for dysuria, urgency and frequency. Negative for hesitancy, hematuria and flank pain.  Musculoskeletal: Negative for back pain and neck pain.  Neurological: Positive for dizziness, weakness, headaches and loss of balance. Negative for tingling, seizures and numbness.  Psychiatric/Behavioral: The patient does not have insomnia.   All other systems reviewed and are negative.   Past Medical History  Diagnosis Date  . CHF (congestive heart failure)   . Hypertension   . Hyperlipidemia   . WERXVQMG(867.6)     consult dr Tomma Rakers visual migraine?  agg by HRT?  . GERD with stricture   . Hiatal hernia   . Allergy   . Anemia   .  Anxiety   . Heart murmur   . Seizures   . Diverticulosis of colon (without mention of hemorrhage)     History   Social History  . Marital Status: Married    Spouse Name: N/A    Number of Children: 1  . Years of Education: N/A   Occupational History  . retired    Social History Main Topics  . Smoking status: Never Smoker   . Smokeless tobacco: Never Used  . Alcohol Use: No     Comment: rarely  . Drug Use: No  . Sexual Activity: Not on file   Other Topics Concern  . Not on file   Social History Narrative   HHof 2 dog    Just  Died   [name]    Retired and married   [name]      Dog with health issues  Cooks for him bladder stones    Past Surgical History  Procedure Laterality Date  . Breast surgery  1990    biopsy  . Abdominal hysterectomy  1987  . Appendectomy  1987  . Tonsillectomy  1953  . Adenoidectomy    . Foot surgery--right  2011    Family History  Problem Relation Age of Onset  . Diabetes Mother   . Diabetes Son   . Breast cancer    . Colon cancer Neg Hx   . Breast cancer Maternal Grandmother     Allergies  Allergen Reactions  . Codeine   . Gabapentin   . Propoxyphene N-Acetaminophen     REACTION: hives,dizzy,gi upset    Current Outpatient Prescriptions on File Prior to Visit  Medication Sig Dispense Refill  . esomeprazole (NEXIUM) 40 MG capsule Take 1 capsule (40 mg total) by mouth 2 (two) times daily.  60 capsule  6  . Krill Oil 500 MG CAPS Take 1 capsule by mouth daily.      [name] LORazepam (ATIVAN) 0.5 MG tablet Take 1 tablet (0.5 mg total) by mouth 2 (two) times daily as needed for anxiety.  30 tablet  0  . MAGNESIUM PO Take by mouth.      . Methylsulfonylmethane (MSM) 1000 MG CAPS Take 1,000 mg by mouth every other day.       . Multiple Vitamin (MULTIVITAMIN) tablet Take 1 tablet by mouth daily.        . vitamin B-12 (CYANOCOBALAMIN) 500 MCG tablet Take 500 mcg by mouth daily.        . [DISCONTINUED] verapamil (COVERA HS) 180 MG (CO) 24 hr  tablet Take 180 mg by mouth at bedtime.         No current facility-administered medications on file prior to visit.    EXAM: BP 140/72  Pulse 72  Temp(Src) 97.7 F (36.5 C) (Oral)  Resp 18  Wt 142 lb (64.411 kg)      Objective:   Physical Exam  Nursing note and vitals reviewed. Constitutional: She is oriented to person, place, and time. She appears well-developed and well-nourished. No distress.  HENT:  Head: Normocephalic and atraumatic.  Right Ear: External ear normal.  Left Ear: External ear normal.  Nose: Nose normal.  Mouth/Throat: Oropharynx is clear and moist. No oropharyngeal exudate.  Bilateral TMs normal. Bilateral frontal and maxillary sinuses non-TTP. No Temporal artery tenderness.  Eyes: Conjunctivae and EOM are normal. Pupils are equal, round, and reactive to light.  Vision grossly intact.  Neck: Normal range of motion. Neck supple. No tracheal deviation present. Thyromegaly present.  Cardiovascular:  Normal rate, regular rhythm and intact distal pulses.   Pulmonary/Chest: Effort normal and breath sounds normal. No stridor. No respiratory distress. She has no wheezes. She has no rales. She exhibits no tenderness.  Abdominal: Soft. Bowel sounds are normal. She exhibits no distension and no mass. There is no tenderness. There is no rebound and no guarding.  Musculoskeletal: Normal range of motion. She exhibits no edema.  Lymphadenopathy:    She has no cervical adenopathy.  Neurological: She is alert and oriented to person, place, and time.  Strength and sensation grossly intact.  Skin: Skin is warm and dry. No rash noted. She is not diaphoretic. No erythema. No pallor.  Psychiatric: She has a normal mood and affect. Her behavior is normal. Judgment and thought content normal.    Lab Results  Component Value Date   WBC 5.6 04/26/2013   HGB 13.6 04/26/2013   HCT 42.0 04/26/2013   PLT 251.0 04/26/2013   GLUCOSE 93 04/26/2013   CHOL 240* 04/26/2013   TRIG 56.0 04/26/2013     HDL 56.30 04/26/2013   LDLDIRECT 169.1 05/30/2012   LDLCALC 173* 04/26/2013   ALT 19 04/26/2013   AST 18 04/26/2013   NA 139 04/26/2013   K 4.2 04/26/2013   CL 103 04/26/2013   CREATININE 1.0 04/26/2013   BUN 18 04/26/2013   CO2 29 04/26/2013   TSH 0.92 04/26/2013   INR 0.9 RATIO 03/29/2007         Assessment & Plan:  Ziyana was seen today for fatigue and dysuria.  Diagnoses and associated orders for this visit:  Dysuria Comments: UA normal. Will obtain culture. - POCT urinalysis dipstick; Standing - POCT urinalysis dipstick - Culture, Urine  Other malaise and fatigue Comments: No specific complaints. Will order lab tests. Monitor symptoms. f/u in 1 week with PCP. - CBC with Differential - CMP - TSH - T4, Free - Sedimentation Rate  Headache(784.0) Comments: Most likely tension type. Will check sed rate. Pt also has f/u with ophthalmology. - Sedimentation Rate    Return precautions provided, and patient handout on general headache.  Plan to follow up in 1 week with PCP, or for worsening or persistent symptoms despite treatment.  Patient Instructions  We will call you with all of your lab results when available.  Continue to monitor your symptoms for any changes.  If emergency symptoms discussed during visit developed, seek medical attention immediately.  Followup in 1 week with PCP to reassess, or for worsening or persistent symptoms despite treatment.

## 2013-09-04 NOTE — Patient Instructions (Addendum)
We will call you with all of your lab results when available.  Continue to monitor your symptoms for any changes.  If emergency symptoms discussed during visit developed, seek medical attention immediately.  Followup in 1 week with PCP to reassess, or for worsening or persistent symptoms despite treatment.     General Headache Without Cause A headache is pain or discomfort felt around the head or neck area. The specific cause of a headache may not be found. There are many causes and types of headaches. A few common ones are:  Tension headaches.  Migraine headaches.  Cluster headaches.  Chronic daily headaches. HOME CARE INSTRUCTIONS   Keep all follow-up appointments with your caregiver or any specialist referral.  Only take over-the-counter or prescription medicines for pain or discomfort as directed by your caregiver.  Lie down in a dark, quiet room when you have a headache.  Keep a headache journal to find out what may trigger your migraine headaches. For example, write down:  What you eat and drink.  How much sleep you get.  Any change to your diet or medicines.  Try massage or other relaxation techniques.  Put ice packs or heat on the head and neck. Use these 3 to 4 times per day for 15 to 20 minutes each time, or as needed.  Limit stress.  Sit up straight, and do not tense your muscles.  Quit smoking if you smoke.  Limit alcohol use.  Decrease the amount of caffeine you drink, or stop drinking caffeine.  Eat and sleep on a regular schedule.  Get 7 to 9 hours of sleep, or as recommended by your caregiver.  Keep lights dim if bright lights bother you and make your headaches worse. SEEK MEDICAL CARE IF:   You have problems with the medicines you were prescribed.  Your medicines are not working.  You have a change from the usual headache.  You have nausea or vomiting. SEEK IMMEDIATE MEDICAL CARE IF:   Your headache becomes severe.  You have a  fever.  You have a stiff neck.  You have loss of vision.  You have muscular weakness or loss of muscle control.  You start losing your balance or have trouble walking.  You feel faint or pass out.  You have severe symptoms that are different from your first symptoms. MAKE SURE YOU:   Understand these instructions.  Will watch your condition.  Will get help right away if you are not doing well or get worse. Document Released: 01/12/2005 Document Revised: 04/06/2011 Document Reviewed: 01/28/2011 [facility] Patient Information 2015 [place], [place]. This information is not intended to replace advice given to you by your health care provider. Make sure you discuss any questions you have with your health care provider.

## 2013-09-06 LAB — URINE CULTURE
COLONY COUNT: NO GROWTH
Organism ID, Bacteria: NO GROWTH

## 2013-11-07 ENCOUNTER — Ambulatory Visit (INDEPENDENT_AMBULATORY_CARE_PROVIDER_SITE_OTHER): Payer: Commercial Managed Care - HMO | Admitting: Internal Medicine

## 2013-11-07 ENCOUNTER — Encounter: Payer: Self-pay | Admitting: Internal Medicine

## 2013-11-07 VITALS — BP 150/70 | HR 70 | Temp 98.4°F | Ht 64.75 in | Wt 139.0 lb

## 2013-11-07 DIAGNOSIS — R030 Elevated blood-pressure reading, without diagnosis of hypertension: Secondary | ICD-10-CM | POA: Insufficient documentation

## 2013-11-07 DIAGNOSIS — J019 Acute sinusitis, unspecified: Secondary | ICD-10-CM

## 2013-11-07 DIAGNOSIS — J069 Acute upper respiratory infection, unspecified: Secondary | ICD-10-CM

## 2013-11-07 MED ORDER — AMOXICILLIN-POT CLAVULANATE 875-125 MG PO TABS: 1.0000 | ORAL_TABLET | Freq: Two times a day (BID) | ORAL | Status: DC

## 2013-11-07 NOTE — Progress Notes (Signed)
Pre visit review using our clinic review tool, if applicable. No additional management support is needed unless otherwise documented below in the visit note.  Chief Complaint  Patient presents with  . Cough    Started two weeks ago.  Pt also wishes to get a flu vaccine.  . Fatigue  . Generalized Body Aches  . Sinus Pain/Pressure    HPI: Patient Alisha Brown  comes in today for SDA for  new problem evaluation. Onset like   Hoarse and then cough and now 3 days of   With neck pain.  Face pain and burning . Frontal and maxillary   Worse when bends over. Had green and bloddy nasal discharge at onset and now not as much but congested  Fever  Weekend 99 .  Chills  Coughing badly   No other sob  recnetl travel to [place]  ROS: See pertinent positives and negatives per HPI.  bp  At drug store     Usually 140.  See past  Had se of lopressor what had he;lped iont he past     Past Medical History  Diagnosis Date  . CHF (congestive heart failure)   . Hypertension   . Hyperlipidemia   . VQQVZDGL(875.6)     consult dr Tomma Rakers visual migraine?  agg by HRT?  . GERD with stricture   . Hiatal hernia   . Allergy   . Anemia   . Anxiety   . Heart murmur   . Seizures   . Diverticulosis of colon (without mention of hemorrhage)     Family History  Problem Relation Age of Onset  . Diabetes Mother   . Diabetes Son   . Breast cancer    . Colon cancer Neg Hx   . Breast cancer Maternal Grandmother     History   Social History  . Marital Status: Married    Spouse Name: N/A    Number of Children: 1  . Years of Education: N/A   Occupational History  . retired    Social History Main Topics  . Smoking status: Never Smoker   . Smokeless tobacco: Never Used  . Alcohol Use: No     Comment: rarely  . Drug Use: No  . Sexual Activity: None   Other Topics Concern  . None   Social History Narrative   HHof 2 dog    Just  Died   [name]    Retired and married   [name]      Dog with health  issues  Cooks for him bladder stones    Outpatient Encounter Prescriptions as of 11/07/2013  Medication Sig  . esomeprazole (NEXIUM) 40 MG capsule Take 1 capsule (40 mg total) by mouth 2 (two) times daily.  [name] MAGNESIUM PO Take by mouth.  . Methylsulfonylmethane (MSM) 1000 MG CAPS Take 1,000 mg by mouth every other day.   . Multiple Vitamin (MULTIVITAMIN) tablet Take 1 tablet by mouth daily.    . vitamin B-12 (CYANOCOBALAMIN) 500 MCG tablet Take 500 mcg by mouth daily.    [name] amoxicillin-clavulanate (AUGMENTIN) 875-125 MG per tablet Take 1 tablet by mouth every 12 (twelve) hours.  . [DISCONTINUED] Krill Oil 500 MG CAPS Take 1 capsule by mouth daily.  . [DISCONTINUED] LORazepam (ATIVAN) 0.5 MG tablet Take 1 tablet (0.5 mg total) by mouth 2 (two) times daily as needed for anxiety.    EXAM:  BP 150/70  Pulse 70  Temp(Src) 98.4 F (36.9 C) (Oral)  Ht 5' 4.75" (  1.645 m)  Wt 139 lb (63.05 kg)  BMI 23.30 kg/m2  SpO2 98%  Body mass index is 23.3 kg/(m^2).  GENERAL: vitals reviewed and listed above, alert, oriented, appears well hydrated and in no acute distress congested non toxic  HEENT: atraumatic, conjunctiva  clear, no obvious abnormalities on inspection of external nose and ears tms clear  Face tender on palpation OP : no lesion edema or exudate pos drinage tracts  NECK: no obvious masses on inspection palpation etender ac area node  LUNGS: clear to auscultation bilaterally, no wheezes, rales or rhonchi, good air movement CV: HRRR, ocass premature beat no clubbing cyanosis or  peripheral edema nl cap refill  MS: moves all extremities without noticeable focal  abnormality PSYCH: pleasant and cooperative, no obvious depression or anxiety BP Readings from Last 3 Encounters:  11/07/13 150/70  09/04/13 140/72  05/03/13 140/68    ASSESSMENT AND PLAN:  Discussed the following assessment and plan:  Acute sinusitis with symptoms greater than 10 days  Protracted URI  Elevated blood  pressure reading - hx of borderline ht may need rx if persiste  usually 1240 rang rov in 2 months get readings   -Patient advised to return or notify health care team  if symptoms worsen ,persist or new concerns arise.  Patient Instructions  Antibiotic for sinus infection. Expect should to improve face pain in the next 3-4 days.  blood pressure is up today compared to  Baseline 140. Check and make sure  140 and below . Get flu shot when improving .    rov in 2-3 about blood pressure .   Standley Brooking. Corayma Cashatt M.D.

## 2013-11-07 NOTE — Patient Instructions (Signed)
Antibiotic for sinus infection. Expect should to improve face pain in the next 3-4 days.  blood pressure is up today compared to  Baseline 140. Check and make sure  140 and below . Get flu shot when improving .    rov in 2-3 about blood pressure .

## 2013-11-13 ENCOUNTER — Telehealth: Payer: Self-pay | Admitting: Internal Medicine

## 2013-11-13 MED ORDER — CEFUROXIME AXETIL 500 MG PO TABS: 500.0000 mg | ORAL_TABLET | Freq: Two times a day (BID) | ORAL | Status: DC

## 2013-11-13 NOTE — Telephone Encounter (Signed)
Patient notified to pick up at the pharmacy. 

## 2013-11-13 NOTE — Telephone Encounter (Signed)
Has a tightness in the center of her head.  Has pain behind her eyes and ears.  Had to stop the Augmentin due to nausea, vomiting and diarrhea.  Has some nasal congestion and some sputum production that is now clear.  Chest is still tight.  Denies any fever.  Too weak to drive.  Husband will pick up any rx sent to the pharmacy.

## 2013-11-13 NOTE — Telephone Encounter (Signed)
Pt called to say that she still has the sinus infection . She said it is not better and would like to know if there is something she want to try   pharmacy ; [facility] [address]

## 2013-11-13 NOTE — Telephone Encounter (Signed)
rx ceftin 500 mg 1 po bid for 5 days disp 10   iof she  Needs to can use zofran 4 mg  1 po q8 hours if needed for nasuea and vomiting   Disp 20

## 2013-11-28 ENCOUNTER — Ambulatory Visit: Payer: Commercial Managed Care - HMO | Admitting: Family Medicine

## 2013-11-28 ENCOUNTER — Ambulatory Visit (INDEPENDENT_AMBULATORY_CARE_PROVIDER_SITE_OTHER): Payer: Commercial Managed Care - HMO | Admitting: Family Medicine

## 2013-11-28 DIAGNOSIS — Z23 Encounter for immunization: Secondary | ICD-10-CM

## 2013-11-30 ENCOUNTER — Other Ambulatory Visit: Payer: Self-pay

## 2013-11-30 DIAGNOSIS — Z1231 Encounter for screening mammogram for malignant neoplasm of breast: Secondary | ICD-10-CM

## 2014-01-05 ENCOUNTER — Telehealth: Payer: Self-pay | Admitting: Internal Medicine

## 2014-01-05 ENCOUNTER — Ambulatory Visit: Payer: Medicare HMO

## 2014-01-05 ENCOUNTER — Encounter: Payer: Self-pay | Admitting: Internal Medicine

## 2014-01-05 DIAGNOSIS — M25579 Pain in unspecified ankle and joints of unspecified foot: Secondary | ICD-10-CM

## 2014-01-05 DIAGNOSIS — K219 Gastro-esophageal reflux disease without esophagitis: Secondary | ICD-10-CM

## 2014-01-05 NOTE — Telephone Encounter (Signed)
Pt called to ask for a  Referral to see the following doctors   GI Dr Olevia Perches for acid reflux .   Podiatrist;  Dr Fritzi Mandes  For foot pain

## 2014-01-05 NOTE — Telephone Encounter (Signed)
I have called the pharmacy. Name brand Nexium is currently preferred by patient's insurance, however it is not carried at the pharmacy any longer (they only have esomeprazole in stock). Insurance will NOT cover Nexium in upcoming year. I have advised patient to continue OTC medication (which she has been doing anyway) until she sees Dr Olevia Perches in the office (this is a former Dr Sharlett Iles patient).

## 2014-01-07 NOTE — Telephone Encounter (Signed)
Please proceed with these referrals  Thanks [facility]

## 2014-01-08 NOTE — Telephone Encounter (Signed)
Referrals placed in the system.

## 2014-01-09 ENCOUNTER — Ambulatory Visit
Admission: RE | Admit: 2014-01-09 | Discharge: 2014-01-09 | Disposition: A | Discharge status: Home / Self Care | Payer: Commercial Managed Care - HMO | Source: Ambulatory Visit

## 2014-01-09 DIAGNOSIS — Z1231 Encounter for screening mammogram for malignant neoplasm of breast: Secondary | ICD-10-CM

## 2014-01-22 ENCOUNTER — Ambulatory Visit (INDEPENDENT_AMBULATORY_CARE_PROVIDER_SITE_OTHER): Payer: Commercial Managed Care - HMO

## 2014-01-22 ENCOUNTER — Encounter: Payer: Self-pay | Admitting: Podiatry

## 2014-01-22 ENCOUNTER — Ambulatory Visit (INDEPENDENT_AMBULATORY_CARE_PROVIDER_SITE_OTHER): Payer: Commercial Managed Care - HMO | Admitting: Podiatry

## 2014-01-22 VITALS — BP 149/84 | HR 75 | Resp 16

## 2014-01-22 DIAGNOSIS — M779 Enthesopathy, unspecified: Secondary | ICD-10-CM

## 2014-01-22 MED ORDER — TRIAMCINOLONE ACETONIDE 10 MG/ML IJ SUSP
10.0000 mg | Freq: Once | INTRAMUSCULAR | Status: AC
  Administered 2014-01-22: 10 mg

## 2014-01-22 NOTE — Progress Notes (Signed)
   Subjective:    Patient ID: Alisha Brown, female    DOB: 11/11/1940, 73 y.o.   MRN: [id number]  HPI Comments: "Having trouble with the ball of foot"  Patient c/o aching, cramping, burning plantar forefoot right for several months. "The pad feels real thick". She has had previous neuroma surgery on the right. Can't bend foot when walking. Wakes up at night with burning. The left foot is starting to feel a little thick as well, but the right is the worst. Tried extra cushion in shoes.  Foot Pain Associated symptoms include abdominal pain, arthralgias, chills, diaphoresis, fatigue, headaches and myalgias.      Review of Systems  Constitutional: Positive for chills, diaphoresis and fatigue.  Eyes: Positive for itching and visual disturbance.  Cardiovascular: Positive for leg swelling.  Gastrointestinal: Positive for abdominal pain.  Musculoskeletal: Positive for myalgias, arthralgias and gait problem.  Neurological: Positive for headaches.  All other systems reviewed and are negative.      Objective:   Physical Exam        Assessment & Plan:

## 2014-01-22 NOTE — Progress Notes (Signed)
Subjective:     Patient ID: Alisha Brown, female   DOB: September 14, 1940, 73 y.o.   MRN: [id number]  HPI patient presents with husband stating she has a lot of pain in her forefoot right over left that's been present for a while but it's gotten worse recently. States the right bothers her more and it's becoming increasingly hard to walk or wear certain types shoes or go barefoot   Review of Systems  All other systems reviewed and are negative.      Objective:   Physical Exam  Constitutional: She is oriented to person, place, and time.  Cardiovascular: Intact distal pulses.   Musculoskeletal: Normal range of motion.  Neurological: She is oriented to person, place, and time.  Skin: Skin is warm and dry.  Nursing note and vitals reviewed.  neurovascular status found to be intact with muscle strength adequate and range of motion within normal limits. Patient's found to have forefoot edema right over left with fluid buildup around the third metatarsophalangeal joint right over left and is noted to have good digital perfusion and patient is well oriented 3 with no equinus condition noted     Assessment:     Probable inflammatory capsulitis right over left foot third MPJ with possibility for moderate neuropathic like symptoms    Plan:     H&P and x-rays reviewed. Were focusing on the right one first and I did a proximal nerve block and then aspirated the third MPJ getting out of a small amount of clear fluid and injected with half cc of dexamethasone Kenalog and applied thick plantar pad to reduce stress on each forefoot

## 2014-01-23 ENCOUNTER — Telehealth: Payer: Self-pay | Admitting: Internal Medicine

## 2014-01-23 NOTE — Telephone Encounter (Signed)
Per [facility], pt can be seen for UTI by another provider tomorrow.  Other provider should only see her for UTI. Please make a separate appointment to be seen by Dr. Regis Bill for burning in hands, feet and mouth. Thanks!

## 2014-01-23 NOTE — Telephone Encounter (Signed)
Please call patient do a clinical assessesment to  What is really going on see if this acts like a uti or something more chronic  To help decide on appt .

## 2014-01-23 NOTE — Telephone Encounter (Signed)
Multiple problem visit.  Please advise.  Thanks!

## 2014-01-23 NOTE — Telephone Encounter (Signed)
Patient states she's having pain urination,chills, ?fever, night sweats, burning in hands, feet and mouth.   Please advise where I can schedule.

## 2014-01-23 NOTE — Telephone Encounter (Signed)
LMOM of patient's home and cell to call the office. Please see the below note to assist with scheduling the patient.

## 2014-01-24 NOTE — Telephone Encounter (Signed)
Patient is scheduled   

## 2014-01-25 ENCOUNTER — Encounter: Payer: Self-pay | Admitting: Internal Medicine

## 2014-01-25 ENCOUNTER — Ambulatory Visit (INDEPENDENT_AMBULATORY_CARE_PROVIDER_SITE_OTHER): Payer: Commercial Managed Care - HMO | Admitting: Internal Medicine

## 2014-01-25 VITALS — BP 160/68 | Temp 97.8°F | Ht 64.75 in | Wt 142.7 lb

## 2014-01-25 DIAGNOSIS — N3000 Acute cystitis without hematuria: Secondary | ICD-10-CM

## 2014-01-25 DIAGNOSIS — R300 Dysuria: Secondary | ICD-10-CM

## 2014-01-25 LAB — POCT URINALYSIS DIPSTICK
BILIRUBIN UA: NEGATIVE
Glucose, UA: NEGATIVE
KETONES UA: NEGATIVE
NITRITE UA: NEGATIVE
Protein, UA: NEGATIVE
Spec Grav, UA: <=1.005
Urobilinogen, UA: 0.2
pH, UA: 5.5

## 2014-01-25 MED ORDER — CEFUROXIME AXETIL 500 MG PO TABS: 500.0000 mg | ORAL_TABLET | Freq: Two times a day (BID) | ORAL | Status: DC

## 2014-01-25 NOTE — Patient Instructions (Addendum)
Probable UTI  Begin antibiotic   Pending culture ceftin 500 twice a day for 5 days   Keep  appt next week   Urinary Tract Infection Urinary tract infections (UTIs) can develop anywhere along your urinary tract. Your urinary tract is your body's drainage system for removing wastes and extra water. Your urinary tract includes two kidneys, two ureters, a bladder, and a urethra. Your kidneys are a pair of bean-shaped organs. Each kidney is about the size of your fist. They are located below your ribs, one on each side of your spine. CAUSES Infections are caused by microbes, which are microscopic organisms, including fungi, viruses, and bacteria. These organisms are so small that they can only be seen through a microscope. Bacteria are the microbes that most commonly cause UTIs. SYMPTOMS  Symptoms of UTIs may vary by age and gender of the patient and by the location of the infection. Symptoms in young women typically include a frequent and intense urge to urinate and a painful, burning feeling in the bladder or urethra during urination. Older women and men are more likely to be tired, shaky, and weak and have muscle aches and abdominal pain. A fever may mean the infection is in your kidneys. Other symptoms of a kidney infection include pain in your back or sides below the ribs, nausea, and vomiting. DIAGNOSIS To diagnose a UTI, your caregiver will ask you about your symptoms. Your caregiver also will ask to provide a urine sample. The urine sample will be tested for bacteria and white blood cells. White blood cells are made by your body to help fight infection. TREATMENT  Typically, UTIs can be treated with medication. Because most UTIs are caused by a bacterial infection, they usually can be treated with the use of antibiotics. The choice of antibiotic and length of treatment depend on your symptoms and the type of bacteria causing your infection. HOME CARE INSTRUCTIONS  If you were prescribed  antibiotics, take them exactly as your caregiver instructs you. Finish the medication even if you feel better after you have only taken some of the medication.  Drink enough water and fluids to keep your urine clear or pale yellow.  Avoid caffeine, tea, and carbonated beverages. They tend to irritate your bladder.  Empty your bladder often. Avoid holding urine for long periods of time.  Empty your bladder before and after sexual intercourse.  After a bowel movement, women should cleanse from front to back. Use each tissue only once. SEEK MEDICAL CARE IF:   You have back pain.  You develop a fever.  Your symptoms do not begin to resolve within 3 days. SEEK IMMEDIATE MEDICAL CARE IF:   You have severe back pain or lower abdominal pain.  You develop chills.  You have nausea or vomiting.  You have continued burning or discomfort with urination. MAKE SURE YOU:   Understand these instructions.  Will watch your condition.  Will get help right away if you are not doing well or get worse. Document Released: 10/22/2004 Document Revised: 07/14/2011 Document Reviewed: 02/20/2011 [facility] Patient Information 2015 [place], [place]. This information is not intended to replace advice given to you by your health care provider. Make sure you discuss any questions you have with your health care provider.

## 2014-01-25 NOTE — Progress Notes (Signed)
Pre visit review using our clinic review tool, if applicable. No additional management support is needed unless otherwise documented below in the visit note.  Chief Complaint  Patient presents with  . Dysuria    HPI: Patient Alisha Brown  comes in today for SDA for  new problem evaluation. See phone note onset about 3-4 days ago waxing and waning    Dysuria  Frequency urgency  No fever som chills at onset no fever . No NVD Has other concerns top come  And fyu next week. bp has been high at home also ?    No flank pain hematuria  ROS: See pertinent positives and negatives per HPI.  Past Medical History  Diagnosis Date  . CHF (congestive heart failure)   . Hypertension   . Hyperlipidemia   . ZSWFUXNA(355.7)     consult dr Tomma Rakers visual migraine?  agg by HRT?  . GERD with stricture   . Hiatal hernia   . Allergy   . Anemia   . Anxiety   . Heart murmur   . Seizures   . Diverticulosis of colon (without mention of hemorrhage)     Family History  Problem Relation Age of Onset  . Diabetes Mother   . Diabetes Son   . Breast cancer    . Colon cancer Neg Hx   . Breast cancer Maternal Grandmother     History   Social History  . Marital Status: Married    Spouse Name: N/A    Number of Children: 1  . Years of Education: N/A   Occupational History  . retired    Social History Main Topics  . Smoking status: Never Smoker   . Smokeless tobacco: Never Used  . Alcohol Use: No     Comment: rarely  . Drug Use: No  . Sexual Activity: None   Other Topics Concern  . None   Social History Narrative   HHof 2 dog    Just  Died   [name]    Retired and married   [name]      Dog with health issues  Cooks for him bladder stones    Outpatient Encounter Prescriptions as of 01/25/2014  Medication Sig  . esomeprazole (NEXIUM) 40 MG capsule Take 1 capsule (40 mg total) by mouth 2 (two) times daily.  [name] MAGNESIUM PO Take by mouth.  . Multiple Vitamin (MULTIVITAMIN) tablet Take 1 tablet  by mouth daily.    . cefUROXime (CEFTIN) 500 MG tablet Take 1 tablet (500 mg total) by mouth 2 (two) times daily. For UTI    EXAM:  BP 160/68 mmHg  Temp(Src) 97.8 F (36.6 C) (Oral)  Ht 5' 4.75" (1.645 m)  Wt 142 lb 11.2 oz (64.728 kg)  BMI 23.92 kg/m2  Body mass index is 23.92 kg/(m^2).  GENERAL: vitals reviewed and listed above, alert, oriented, appears well hydrated and in no acute distress HEENT: atraumatic, conjunctiva  clear, no obvious abnormalities on inspection of external nose and ears NECK: no obvious masses on inspection palpation  LUNGS: clear to auscultation bilaterally, no wheezes, rales or rhonchi, CV: HRRR, no clubbing cyanosis or  peripheral edema nl cap refill  Abdomen:  Sof,t normal bowel sounds without hepatosplenomegaly, no guarding rebound or masses no CVA tenderness MS: moves all extremities without noticeable focal  abnormality PSYCH: pleasant and cooperative, no obvious depression or anxiety UA pos leuk and blood 1+ ASSESSMENT AND PLAN:  Discussed the following assessment and plan:  Dysuria - Plan: POC  Urinalysis Dipstick, Culture, Urine  Acute cystitis without hematuria - prob   -Patient advised to return or notify health care team  if symptoms worsen ,persist or new concerns arise.  Patient Instructions  Probable UTI  Begin antibiotic   Pending culture ceftin 500 twice a day for 5 days   Keep  appt next week   Urinary Tract Infection Urinary tract infections (UTIs) can develop anywhere along your urinary tract. Your urinary tract is your body's drainage system for removing wastes and extra water. Your urinary tract includes two kidneys, two ureters, a bladder, and a urethra. Your kidneys are a pair of bean-shaped organs. Each kidney is about the size of your fist. They are located below your ribs, one on each side of your spine. CAUSES Infections are caused by microbes, which are microscopic organisms, including fungi, viruses, and bacteria.  These organisms are so small that they can only be seen through a microscope. Bacteria are the microbes that most commonly cause UTIs. SYMPTOMS  Symptoms of UTIs may vary by age and gender of the patient and by the location of the infection. Symptoms in young women typically include a frequent and intense urge to urinate and a painful, burning feeling in the bladder or urethra during urination. Older women and men are more likely to be tired, shaky, and weak and have muscle aches and abdominal pain. A fever may mean the infection is in your kidneys. Other symptoms of a kidney infection include pain in your back or sides below the ribs, nausea, and vomiting. DIAGNOSIS To diagnose a UTI, your caregiver will ask you about your symptoms. Your caregiver also will ask to provide a urine sample. The urine sample will be tested for bacteria and white blood cells. White blood cells are made by your body to help fight infection. TREATMENT  Typically, UTIs can be treated with medication. Because most UTIs are caused by a bacterial infection, they usually can be treated with the use of antibiotics. The choice of antibiotic and length of treatment depend on your symptoms and the type of bacteria causing your infection. HOME CARE INSTRUCTIONS  If you were prescribed antibiotics, take them exactly as your caregiver instructs you. Finish the medication even if you feel better after you have only taken some of the medication.  Drink enough water and fluids to keep your urine clear or pale yellow.  Avoid caffeine, tea, and carbonated beverages. They tend to irritate your bladder.  Empty your bladder often. Avoid holding urine for long periods of time.  Empty your bladder before and after sexual intercourse.  After a bowel movement, women should cleanse from front to back. Use each tissue only once. SEEK MEDICAL CARE IF:   You have back pain.  You develop a fever.  Your symptoms do not begin to resolve within  3 days. SEEK IMMEDIATE MEDICAL CARE IF:   You have severe back pain or lower abdominal pain.  You develop chills.  You have nausea or vomiting.  You have continued burning or discomfort with urination. MAKE SURE YOU:   Understand these instructions.  Will watch your condition.  Will get help right away if you are not doing well or get worse. Document Released: 10/22/2004 Document Revised: 07/14/2011 Document Reviewed: 02/20/2011 [facility] Patient Information 2015 [place], [place]. This information is not intended to replace advice given to you by your health care provider. Make sure you discuss any questions you have with your health care provider.  Standley Brooking. Layken Beg M.D.

## 2014-01-28 LAB — URINE CULTURE

## 2014-01-29 ENCOUNTER — Ambulatory Visit (INDEPENDENT_AMBULATORY_CARE_PROVIDER_SITE_OTHER): Payer: Commercial Managed Care - HMO | Admitting: Podiatry

## 2014-01-29 VITALS — BP 152/90 | HR 78 | Resp 16

## 2014-01-29 DIAGNOSIS — M779 Enthesopathy, unspecified: Secondary | ICD-10-CM | POA: Diagnosis not present

## 2014-01-29 DIAGNOSIS — G629 Polyneuropathy, unspecified: Secondary | ICD-10-CM

## 2014-01-29 NOTE — Progress Notes (Signed)
Quick Note:  Tell patient that urine culture shows e coli sensitive to medication given . Should resolve with current treatment .FU if not better. ______ 

## 2014-01-31 ENCOUNTER — Ambulatory Visit (INDEPENDENT_AMBULATORY_CARE_PROVIDER_SITE_OTHER): Payer: Commercial Managed Care - HMO | Admitting: Internal Medicine

## 2014-01-31 ENCOUNTER — Encounter: Payer: Self-pay | Admitting: Internal Medicine

## 2014-01-31 VITALS — BP 142/82 | Temp 98.1°F | Ht 64.75 in | Wt 144.1 lb

## 2014-01-31 DIAGNOSIS — R208 Other disturbances of skin sensation: Secondary | ICD-10-CM

## 2014-01-31 DIAGNOSIS — G629 Polyneuropathy, unspecified: Secondary | ICD-10-CM

## 2014-01-31 NOTE — Progress Notes (Signed)
Subjective:     Patient ID: Alisha Brown, female   DOB: 30-Sep-1940, 74 y.o.   MRN: [id number]  HPI patient presents with significant forefoot pain right with inflammation around the second and third metatarsal phalangeal joints that did not respond to previous injection and reduction of pressure treatment    Review of Systems     Objective:   Physical Exam Neurovascular status was found to be unchanged with patient found to have forefoot discomfort around the second and third metatarsophalangeal joints with inflammation noted especially in the second metatarsophalangeal joint. 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 Today I recommended full immobilization and patient wants this understanding that she will have to be careful with gait. I applied a short air fracture walker with all instructions on usage and gave patient instructions on wearing this when walking and patient will be reevaluated in 3-4 weeks or earlier if any other issues should occur

## 2014-01-31 NOTE — Patient Instructions (Addendum)
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  Uncertain cause . Get appt for fasting lab ( water and meds ok)  I will review record also .  Will arrange neurology consult about this .   blood pressure monitor is good enough to get readings at home .  UTI should  Continue to get better after   rx .  Peripheral Neuropathy Peripheral neuropathy is a type of nerve damage. It affects nerves that carry signals between the spinal cord and other parts of the body. These are called peripheral nerves. With peripheral neuropathy, one nerve or a group of nerves may be damaged.  CAUSES  Many things can damage peripheral nerves. For some people with peripheral neuropathy, the cause is unknown. Some causes include:  Diabetes. This is the most common cause of peripheral neuropathy.  Injury to a nerve.  Pressure or stress on a nerve that lasts a long time.  Too little vitamin B. Alcoholism can lead to this.  Infections.  Autoimmune diseases, such as multiple sclerosis and systemic lupus erythematosus.  Inherited nerve diseases.  Some medicines, such as cancer drugs.  Toxic substances, such as lead and mercury.  Too little blood flowing to the legs.  Kidney disease.  Thyroid disease. SIGNS AND SYMPTOMS  Different people have different symptoms. The symptoms you have will depend on which of your nerves is damaged. Common symptoms include:  Loss of feeling (numbness) in the feet and hands.  Tingling in the feet and hands.  Pain that burns.  Very sensitive skin.  Weakness.  Not being able to move a part of the body (paralysis).  Muscle twitching.  Clumsiness or poor coordination.  Loss of balance.  Not being able to control your bladder.  Feeling dizzy.  Sexual problems. DIAGNOSIS  Peripheral neuropathy is a symptom, not a disease. Finding the cause of peripheral neuropathy can be hard. To figure that out, your health care provider will take a medical history and do  a physical exam. A neurological exam will also be done. This involves checking things affected by your brain, spinal cord, and nerves (nervous system). For example, your health care provider will check your reflexes, how you move, and what you can feel.  Other types of tests may also be ordered, such as:  Blood tests.  A test of the fluid in your spinal cord.  Imaging tests, such as CT scans or an MRI.  Electromyography (EMG). This test checks the nerves that control muscles.  Nerve conduction velocity tests. These tests check how fast messages pass through your nerves.  Nerve biopsy. A small piece of nerve is removed. It is then checked under a microscope. TREATMENT   Medicine is often used to treat peripheral neuropathy. Medicines may include:  Pain-relieving medicines. Prescription or over-the-counter medicine may be suggested.  Antiseizure medicine. This may be used for pain.  Antidepressants. These also may help ease pain from neuropathy.  Lidocaine. This is a numbing medicine. You might wear a patch or be given a shot.  Mexiletine. This medicine is typically used to help control irregular heart rhythms.  Surgery. Surgery may be needed to relieve pressure on a nerve or to destroy a nerve that is causing pain.  Physical therapy to help movement.  Assistive devices to help movement. HOME CARE INSTRUCTIONS   Only take over-the-counter or prescription medicines as directed by your health care provider. Follow the instructions carefully for any given medicines. Do not take any other medicines without  first getting approval from your health care provider.  If you have diabetes, work closely with your health care provider to keep your blood sugar under control.  If you have numbness in your feet:  Check every day for signs of injury or infection. Watch for redness, warmth, and swelling.  Wear padded socks and comfortable shoes. These help protect your feet.  Do not do  things that put pressure on your damaged nerve.  Do not smoke. Smoking keeps blood from getting to damaged nerves.  Avoid or limit alcohol. Too much alcohol can cause a lack of B vitamins. These vitamins are needed for healthy nerves.  Develop a good support system. Coping with peripheral neuropathy can be stressful. Talk to a mental health specialist or join a support group if you are struggling.  Follow up with your health care provider as directed. SEEK MEDICAL CARE IF:   You have new signs or symptoms of peripheral neuropathy.  You are struggling emotionally from dealing with peripheral neuropathy.  You have a fever. SEEK IMMEDIATE MEDICAL CARE IF:   You have an injury or infection that is not healing.  You feel very dizzy or begin vomiting.  You have chest pain.  You have trouble breathing. Document Released: 01/02/2002 Document Revised: 09/24/2010 Document Reviewed: 09/19/2012 [facility] Patient Information 2015 [place], [place]. This information is not intended to replace advice given to you by your health care provider. Make sure you discuss any questions you have with your health care provider.
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
## 2014-02-02 ENCOUNTER — Other Ambulatory Visit (INDEPENDENT_AMBULATORY_CARE_PROVIDER_SITE_OTHER): Payer: Commercial Managed Care - HMO

## 2014-02-02 DIAGNOSIS — R208 Other disturbances of skin sensation: Secondary | ICD-10-CM

## 2014-02-02 DIAGNOSIS — G629 Polyneuropathy, unspecified: Secondary | ICD-10-CM | POA: Diagnosis not present

## 2014-02-02 LAB — CBC WITH DIFFERENTIAL/PLATELET
Basophils Absolute: 0 10*3/uL (ref 0.0–0.1)
Basophils Relative: 0.7 % (ref 0.0–3.0)
Eosinophils Absolute: 0.1 10*3/uL (ref 0.0–0.7)
Eosinophils Relative: 1.7 % (ref 0.0–5.0)
HCT: 41.5 % (ref 36.0–46.0)
HEMOGLOBIN: 13.5 g/dL (ref 12.0–15.0)
Lymphocytes Relative: 37.5 % (ref 12.0–46.0)
Lymphs Abs: 2.2 10*3/uL (ref 0.7–4.0)
MCHC: 32.6 g/dL (ref 30.0–36.0)
MCV: 89.5 fl (ref 78.0–100.0)
Monocytes Absolute: 0.5 10*3/uL (ref 0.1–1.0)
Monocytes Relative: 8 % (ref 3.0–12.0)
NEUTROS ABS: 3 10*3/uL (ref 1.4–7.7)
NEUTROS PCT: 52.1 % (ref 43.0–77.0)
PLATELETS: 229 10*3/uL (ref 150.0–400.0)
RBC: 4.64 Mil/uL (ref 3.87–5.11)
RDW: 14.4 % (ref 11.5–15.5)
WBC: 5.8 10*3/uL (ref 4.0–10.5)

## 2014-02-02 LAB — LIPID PANEL
Cholesterol: 241 mg/dL — ABNORMAL HIGH (ref 0–200)
HDL: 64.5 mg/dL (ref >39.00)
LDL Cholesterol: 164 mg/dL — ABNORMAL HIGH (ref 0–99)
NonHDL: 176.5
TRIGLYCERIDES: 61 mg/dL (ref 0.0–149.0)
Total CHOL/HDL Ratio: 4
VLDL: 12.2 mg/dL (ref 0.0–40.0)

## 2014-02-02 LAB — HEPATIC FUNCTION PANEL
ALBUMIN: 4.3 g/dL (ref 3.5–5.2)
ALK PHOS: 74 U/L (ref 39–117)
ALT: 29 U/L (ref 0–35)
AST: 25 U/L (ref 0–37)
BILIRUBIN DIRECT: 0 mg/dL (ref 0.0–0.3)
BILIRUBIN TOTAL: 0.6 mg/dL (ref 0.2–1.2)
Total Protein: 7.2 g/dL (ref 6.0–8.3)

## 2014-02-02 LAB — VITAMIN B12: Vitamin B-12: 868 pg/mL (ref 211–911)

## 2014-02-02 LAB — BASIC METABOLIC PANEL
BUN: 24 mg/dL — ABNORMAL HIGH (ref 6–23)
CALCIUM: 9.4 mg/dL (ref 8.4–10.5)
CHLORIDE: 104 meq/L (ref 96–112)
CO2: 29 meq/L (ref 19–32)
Creatinine, Ser: 0.8 mg/dL (ref 0.4–1.2)
GFR: 80.9 mL/min (ref >60.00)
Glucose, Bld: 94 mg/dL (ref 70–99)
Potassium: 4.8 mEq/L (ref 3.5–5.1)
Sodium: 141 mEq/L (ref 135–145)

## 2014-02-02 LAB — SEDIMENTATION RATE: SED RATE: 13 mm/h (ref 0–22)

## 2014-02-02 LAB — C-REACTIVE PROTEIN: CRP: <0.5 mg/dL (ref 0.5–20.0)

## 2014-02-02 LAB — TSH: TSH: 1.62 u[IU]/mL (ref 0.35–4.50)

## 2014-02-02 LAB — T4, FREE: FREE T4: 0.88 ng/dL (ref 0.60–1.60)

## 2014-02-02 LAB — HEMOGLOBIN A1C: Hgb A1c MFr Bld: 6.3 % (ref 4.6–6.5)

## 2014-02-03 LAB — HEPATITIS B SURFACE ANTIBODY,QUALITATIVE: Hep B S Ab: NEGATIVE

## 2014-02-03 LAB — HEPATITIS C ANTIBODY: HCV Ab: NEGATIVE

## 2014-02-03 LAB — HEPATITIS B SURFACE ANTIGEN: HEP B S AG: NEGATIVE

## 2014-02-05 LAB — ANA: Anti Nuclear Antibody(ANA): NEGATIVE

## 2014-02-06 LAB — IMMUNOFIXATION ELECTROPHORESIS
IGM, SERUM: 64 mg/dL (ref 52–322)
IgA: 102 mg/dL (ref 69–380)
IgG (Immunoglobin G), Serum: 689 mg/dL — ABNORMAL LOW (ref 690–1700)
Total Protein, Serum Electrophoresis: 6.4 g/dL (ref 6.0–8.3)

## 2014-02-20 ENCOUNTER — Ambulatory Visit (INDEPENDENT_AMBULATORY_CARE_PROVIDER_SITE_OTHER): Payer: Commercial Managed Care - HMO | Admitting: Internal Medicine

## 2014-02-20 ENCOUNTER — Encounter: Payer: Self-pay | Admitting: Internal Medicine

## 2014-02-20 VITALS — BP 124/64 | HR 74 | Ht 65.0 in | Wt 144.6 lb

## 2014-02-20 DIAGNOSIS — J387 Other diseases of larynx: Secondary | ICD-10-CM | POA: Diagnosis not present

## 2014-02-20 DIAGNOSIS — K219 Gastro-esophageal reflux disease without esophagitis: Secondary | ICD-10-CM

## 2014-02-20 DIAGNOSIS — K222 Esophageal obstruction: Secondary | ICD-10-CM | POA: Diagnosis not present

## 2014-02-20 DIAGNOSIS — K449 Diaphragmatic hernia without obstruction or gangrene: Secondary | ICD-10-CM

## 2014-02-20 MED ORDER — ESOMEPRAZOLE MAGNESIUM 40 MG PO CPDR: 40.0000 mg | DELAYED_RELEASE_CAPSULE | Freq: Two times a day (BID) | ORAL | Status: DC

## 2014-02-20 NOTE — Patient Instructions (Addendum)
Dr Regis Bill  You have been scheduled for a Barium Esophogram at [facility] (1st floor of the hospital) on 02/23/2014 at 11:00am. Please arrive 15 minutes prior to your appointment for registration. Make certain not to have anything to eat or drink 3 hours prior to your test. If you need to reschedule for any reason, please contact radiology at [phone] to do so. __________________________________________________________________ A barium swallow is an examination that concentrates on views of the esophagus. This tends to be a double contrast exam (barium and two liquids which, when combined, create a gas to distend the wall of the oesophagus) or single contrast (non-ionic iodine based). The study is usually tailored to your symptoms so a good history is essential. Attention is paid during the study to the form, structure and configuration of the esophagus, looking for functional disorders (such as aspiration, dysphagia, achalasia, motility and reflux) EXAMINATION You may be asked to change into a gown, depending on the type of swallow being performed. A radiologist and radiographer will perform the procedure. The radiologist will advise you of the type of contrast selected for your procedure and direct you during the exam. You will be asked to stand, sit or lie in several different positions and to hold a small amount of fluid in your mouth before being asked to swallow while the imaging is performed .In some instances you may be asked to swallow barium coated marshmallows to assess the motility of a solid food bolus. The exam can be recorded as a digital or video fluoroscopy procedure. POST PROCEDURE It will take 1-2 days for the barium to pass through your system. To facilitate this, it is important, unless otherwise directed, to increase your fluids for the next 24-48hrs and to resume your normal diet.  This test typically takes about 30 minutes to perform.    We have sent the following  medications to your pharmacy for you to pick up at your convenience:  Esomeprazole  Take Pepcid 10mg  a mid day  Chew 1-2 gaviscon as needed DR panosh   __________________________________________________________________________________

## 2014-02-20 NOTE — Progress Notes (Signed)
Alisha Brown 1941/01/26 [id number]  Note: This dictation was prepared with Dragon digital system. Any transcriptional errors that result from this procedure are unintentional.   History of Present Illness: This is a  74 year old white female former Dr. Buel Ream patient with severe gastroesophageal reflux disease who used to be well controlled on Nexium 40 mg twice a day. But has has been having breakthrough symptoms after she had  to switch to over-the-counter Nexium 20 mg. She wakes up at night coughing and also has reflux. Last upper endoscopy and December 2013 showed medium size 5 cm hiatal hernia with esophageal stricture which was dilated with Venia Minks dilator 44 [place]. Last colonoscopy in December 2013 showed severe diverticulosis of the left and right colon. She is here today to discuss treatment of the reflux. Her insurance will only  cover generic Nexium    Past Medical History  Diagnosis Date  . CHF (congestive heart failure)   . Hypertension   . Hyperlipidemia   . IFOYDXAJ(287.8)     consult dr Tomma Rakers visual migraine?  agg by HRT?  . GERD with stricture   . Hiatal hernia   . Allergy   . Anemia   . Anxiety   . Heart murmur   . Seizures   . Diverticulosis of colon (without mention of hemorrhage)   . Arrhythmia     heart    Past Surgical History  Procedure Laterality Date  . Breast surgery Right 1990    biopsy,benign  . Abdominal hysterectomy  1987  . Appendectomy  1987  . Tonsillectomy  1953  . Adenoidectomy    . Foot surgery--right  2011    Allergies  Allergen Reactions  . Aleve [Naproxen Sodium] Hives  . Augmentin [Amoxicillin-Pot Clavulanate] Diarrhea and Nausea And Vomiting  . Codeine   . Gabapentin   . Krill Oil Hives  . Propoxyphene N-Acetaminophen     REACTION: hives,dizzy,gi upset    Family history and social history have been reviewed.  Review of Systems: Nocturnal cough. The hoarseness. Heartburn. Denies dysphagia  The remainder of the 10  point ROS is negative except as outlined in the H&P  Physical Exam: General Appearance Well developed, in no distress Eyes  Non icteric  HEENT  Non traumatic, normocephalic  Mouth No lesion, tongue papillated, no cheilosis Neck Supple without adenopathy, thyroid not enlarged, no carotid bruits, no JVD, normal voice. No coughing Lungs Clear to auscultation bilaterally COR Normal S1, normal S2, regular rhythm, no murmur, quiet precordium Abdomen soft nontender with normoactive bowel sounds. No distention. Liver edge at costal margin Rectal not done Extremities  No pedal edema Skin No lesions Neurological Alert and oriented x 3 Psychological Normal mood and affect  Assessment and Plan:   74 year old white female with the severe gastroesophageal reflux disease refractory to generic PPI. We will increase generic Nexium to Esomeprazole  40 mg twice a day and start Pepcid 10 mg  in the middle of the day. She will also use Gaviscon when necessary. We will obtain barium esophagram to assess her motility and size of the hiatal hernia to see if she would be candidate for Nissen fundoplication I will see her in 8 weeks    Delfin Edis @TODAY (<PARAMETER> error)@

## 2014-02-23 ENCOUNTER — Ambulatory Visit (HOSPITAL_COMMUNITY)
Admission: RE | Admit: 2014-02-23 | Discharge: 2014-02-23 | Disposition: A | Discharge status: Home / Self Care | Payer: Commercial Managed Care - HMO | Source: Ambulatory Visit | Attending: Internal Medicine | Admitting: Internal Medicine

## 2014-02-23 ENCOUNTER — Telehealth: Payer: Self-pay

## 2014-02-23 DIAGNOSIS — K440 Diaphragmatic hernia with obstruction, without gangrene: Secondary | ICD-10-CM | POA: Insufficient documentation

## 2014-02-23 DIAGNOSIS — K222 Esophageal obstruction: Secondary | ICD-10-CM

## 2014-02-23 DIAGNOSIS — K219 Gastro-esophageal reflux disease without esophagitis: Secondary | ICD-10-CM

## 2014-02-23 DIAGNOSIS — K449 Diaphragmatic hernia without obstruction or gangrene: Secondary | ICD-10-CM

## 2014-02-23 DIAGNOSIS — K224 Dyskinesia of esophagus: Secondary | ICD-10-CM | POA: Diagnosis not present

## 2014-03-13 ENCOUNTER — Ambulatory Visit (INDEPENDENT_AMBULATORY_CARE_PROVIDER_SITE_OTHER): Payer: Commercial Managed Care - HMO | Admitting: Neurology

## 2014-03-13 ENCOUNTER — Encounter: Payer: Self-pay | Admitting: Neurology

## 2014-03-13 VITALS — BP 148/88 | HR 68 | Ht 65.0 in | Wt 143.0 lb

## 2014-03-13 DIAGNOSIS — M791 Myalgia, unspecified site: Secondary | ICD-10-CM

## 2014-03-13 DIAGNOSIS — G43109 Migraine with aura, not intractable, without status migrainosus: Secondary | ICD-10-CM

## 2014-03-13 DIAGNOSIS — R209 Unspecified disturbances of skin sensation: Secondary | ICD-10-CM | POA: Diagnosis not present

## 2014-03-13 NOTE — Patient Instructions (Signed)
1.  No worrisome signs on your exam today.  If your symptoms worsen, please contact my office to schedule nerve testing and/or MRI brain. 2.  Please discuss possible diagnosis of fibromyaglia with your primary care provider

## 2014-03-13 NOTE — Progress Notes (Signed)
[facility] Clinic Note - Initial Visit   Date: 03/13/2014   Alisha Brown MRN: [id number] DOB: 01/23/1941   Dear Dr. Regis Bill:  Thank you for your kind referral of Alisha Brown for consultation of generalized numbness and tingling. Although her history is well known to you, please allow [place] to reiterate it for the purpose of our medical record. The patient was accompanied to the clinic by self.    History of Present Illness: Alisha Brown is a 74 y.o. 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    Starting around early 2015, she developed bilateral burning sensation over the feet and later that year, she developed the same tingling of her hands.  Symptoms are intermittent and worsened by prolonged standing.  No exacerbating or alleviating factors.  She is more concerned about generalized muscle aches and feels as if she has been in a car wreck.  She feels tired and achy as if she is bruised everywhere.  She does not recall the last time she felt well.  She also complains of burning mouth after having a dental procedure, which has since improved over the years.  For her paresthesias, she underwent extensive lab work-up incluing HbA1c, ANA, TSH, HCV, HBS, ESR, SPEP, and vitamin B12 which returned normal.  She has seen Dr. Tomma Rakers here at [facility] neurology in March 2014 for complains of visual disturbance and headaches, which has been ongoing since her teen years.  She rarely had a headache, but continues to have visual auras.  She was previously treated for seizure disorder in the 1990s with gabapentin 2853m/day, but was later diagnosed with pseudoseizures after having a 4-day EMU stay.  Spells were manifested with bright light and humming noise.   In her 726s she had imaging of the brain which showed small area abnormality which was stable on subsequent imaging.  She has not had imaging of  the brain recently.   Out-side paper records, electronic medical record, and images have been reviewed where available and summarized as:  Labs 02/02/2014:  CBC, CMP, HbA1c 6.3, TSH 1.62, free T4 0.88, HCV neg, hepatitis B neg, ANA neg, ESR 13, SPEP no M protein, vitamin B12 868   Past Medical History  Diagnosis Date  . CHF (congestive heart failure)   . Hypertension   . Hyperlipidemia   . HZHYQMVHQ(469.6     consult dr sTomma Rakersvisual migraine?  agg by HRT?  . GERD with stricture   . Hiatal hernia   . Allergy   . Anemia   . Anxiety   . Heart murmur   . Seizures   . Diverticulosis of colon (without mention of hemorrhage)   . Arrhythmia     heart    Past Surgical History  Procedure Laterality Date  . Breast surgery Right 1990    biopsy,benign  . Abdominal hysterectomy  1987  . Appendectomy  1987  . Tonsillectomy  1953  . Adenoidectomy    . Foot surgery Right 2011  . Tubal ligation       Medications:  Current Outpatient Prescriptions on File Prior to Visit  Medication Sig Dispense Refill  . Cholecalciferol (VITAMIN D3) 400 UNITS CAPS Take 1 capsule by mouth daily.    .Marland Kitchenesomeprazole (NEXIUM) 40 MG capsule Take 1 capsule (40 mg total) by mouth 2 (two) times daily. 60 capsule 3  . Flaxseed, Linseed, (FLAXSEED OIL PO) Take by mouth. Takes 1- 2 daily.    .Marland KitchenMAGNESIUM  PO Take 1 tablet by mouth daily.     . Multiple Vitamin (MULTIVITAMIN) tablet Take 1 tablet by mouth daily.      . vitamin B-12 (CYANOCOBALAMIN) 500 MCG tablet Take 500 mcg by mouth daily.    . [DISCONTINUED] verapamil (COVERA HS) 180 MG (CO) 24 hr tablet Take 180 mg by mouth at bedtime.       No current facility-administered medications on file prior to visit.    Allergies:  Allergies  Allergen Reactions  . Aleve [Naproxen Sodium] Hives  . Augmentin [Amoxicillin-Pot Clavulanate] Diarrhea and Nausea And Vomiting  . Codeine   . Gabapentin   . Krill Oil Hives  . Propoxyphene N-Acetaminophen     REACTION:  hives,dizzy,gi upset    Family History: Family History  Problem Relation Age of Onset  . Diabetes Mother   . Diabetes Son   . Diabetes Sister     1/2 sister-same mom  . Colon cancer Neg Hx   . Breast cancer Maternal Grandmother   . Hypertension Brother     Social History: History   Social History  . Marital Status: Married    Spouse Name: N/A  . Number of Children: 1  . Years of Education: N/A   Occupational History  . retired    Social History Main Topics  . Smoking status: Never Smoker   . Smokeless tobacco: Never Used  . Alcohol Use: No     Comment: rarely  . Drug Use: No  . Sexual Activity: Not on file   Other Topics Concern  . Not on file   Social History Narrative   HHof 2 dog    Just  Died   Alisha Brown    Retired and married   [name]      Dog with health issues  Cooks for him bladder stones    Review of Systems:  CONSTITUTIONAL: No fevers, chills, night sweats, or weight loss.   EYES: No visual changes or eye pain ENT: No hearing changes.  No history of nose bleeds.   RESPIRATORY: No cough, wheezing and shortness of breath.   CARDIOVASCULAR: Negative for chest pain, and palpitations.   GI: Negative for abdominal discomfort, blood in stools or black stools.  No recent change in bowel habits.   GU:  No history of incontinence.   MUSCLOSKELETAL: No history of joint pain or swelling.  +myalgias.   SKIN: Negative for lesions, rash, and itching.   HEMATOLOGY/ONCOLOGY: Negative for prolonged bleeding, bruising easily, and swollen nodes.  ENDOCRINE: Negative for cold or heat intolerance, polydipsia or goiter.   PSYCH:  +depression or anxiety symptoms.   NEURO: As Above.   Vital Signs:  BP 148/88 mmHg  Pulse 68  Ht 5' 5"  (1.651 m)  Wt 143 lb (64.864 kg)  BMI 23.80 kg/m2  General Medical Exam:   General:  Well appearing, comfortable.   Eyes/ENT: see cranial nerve examination.   Neck: No masses appreciated.  Full range of motion without tenderness.  No  carotid bruits. Respiratory:  Clear to auscultation, good air entry bilaterally.   Cardiac:  Regular rate and rhythm, no murmur.   Extremities:  No deformities, edema, or skin discoloration.  Skin:  No rashes or lesions.  Neurological Exam: MENTAL STATUS including orientation to time, place, person, recent and remote memory, attention span and concentration, language, and fund of knowledge is normal.  Speech is not dysarthric.  CRANIAL NERVES: II:  No visual field defects.  Unremarkable fundi.   III-IV-VI: Pupils  equal round and reactive to light.  Normal conjugate, extra-ocular eye movements in all directions of gaze.  No nystagmus.  Right ptosis (old).   V:  Normal facial sensation.   VII:  Normal facial symmetry and movements.  No pathologic facial reflexes.  VIII:  Normal hearing and vestibular function.   IX-X:  Normal palatal movement.   XI:  Normal shoulder shrug and head rotation.   XII:  Normal tongue strength and range of motion, no deviation or fasciculation.  MOTOR:  No atrophy, fasciculations or abnormal movements.  No pronator drift.  Tone is normal.    Right Upper Extremity:    Left Upper Extremity:    Deltoid  5/5   Deltoid  5/5   Biceps  5/5   Biceps  5/5   Triceps  5/5   Triceps  5/5   Wrist extensors  5/5   Wrist extensors  5/5   Wrist flexors  5/5   Wrist flexors  5/5   Finger extensors  5/5   Finger extensors  5/5   Finger flexors  5/5   Finger flexors  5/5   Dorsal interossei  5/5   Dorsal interossei  5/5   Abductor pollicis  5/5   Abductor pollicis  5/5   Tone (Ashworth scale)  0  Tone (Ashworth scale)  0   Right Lower Extremity:    Left Lower Extremity:    Hip flexors  5/5   Hip flexors  5/5   Hip extensors  5/5   Hip extensors  5/5   Knee flexors  5/5   Knee flexors  5/5   Knee extensors  5/5   Knee extensors  5/5   Dorsiflexors  5/5   Dorsiflexors  5/5   Plantarflexors  5/5   Plantarflexors  5/5   Toe extensors  5/5   Toe extensors  5/5   Toe flexors   5/5   Toe flexors  5/5   Tone (Ashworth scale)  0  Tone (Ashworth scale)  0   MSRs:  Right                                                                 Left brachioradialis 2+  brachioradialis 2+  biceps 2+  biceps 2+  triceps 2+  triceps 2+  patellar 2+  patellar 2+  ankle jerk 2+  ankle jerk 2+  Hoffman no  Hoffman no  plantar response down  plantar response down   SENSORY:  Normal and symmetric perception of light touch, pinprick, vibration, and proprioception.  Romberg's sign absent.   COORDINATION/GAIT: Normal finger-to- nose-finger and heel-to-shin.  Intact rapid alternating movements bilaterally.  Able to rise from a chair without using arms.  Gait narrow based and stable. Tandem and stressed gait intact.    IMPRESSION: Alisha Brown is a 74 year-old female with presenting for evaluation of episodic paresthesias involving her hands and feet.  Her exam is notable for mild right ptosis and otherwise normal and non-focal.  She had underwent extensive laboratory work-up for her paresthesias (Hb1c, ANA, TSH, B12, SPEP, HCV, HBV) which returned normal.  With her normal exam, neuropathy is less likely.  I offered NCS/EMG to better characterize the nature of her symptoms, but she declined.  With  her non-anatomical distribution of paresthesias, I also offered MRI brain but she does not wish to have this.  She seems to be reassured by her normal exam and does not wish to have additional neurological work-up.    Further, she is most bothered by her generalized myalgia which seems to be more consistent with fibromyalgia.  I have asked her to follow-up with her PCP or rheumatology for evaluation and management.   Return to clinic as needed  The duration of this appointment visit was 45 minutes of face-to-face time with the patient.  Greater than 50% of this time was spent in counseling, explanation of diagnosis, planning of further management, and coordination of care.   Thank you for  allowing me to participate in patient's care.  If I can answer any additional questions, I would be pleased to do so.    Sincerely,    Donika K. Posey Pronto, DO

## 2014-03-30 ENCOUNTER — Telehealth: Payer: Self-pay | Admitting: Internal Medicine

## 2014-03-30 NOTE — Telephone Encounter (Signed)
Prior Authorization for Esomeprazole, 40 mg was faxed to [facility] Awaiting for response back. Did call the patient at 11:10 am on 03/30/14 to let them know their PA has been initiated. Patient stated they understood.

## 2014-04-02 MED ORDER — ESOMEPRAZOLE MAGNESIUM 40 MG PO CPDR: 40.0000 mg | DELAYED_RELEASE_CAPSULE | Freq: Two times a day (BID) | ORAL | Status: DC

## 2014-04-02 NOTE — Telephone Encounter (Signed)
PA was approved from [facility] for esomeprazole, 40 mg. The authorization is good until 03/30/2015. Sent Rx for Esomeprazole, 40 mg, #60 with three refills to [facility] in [place]. Called patient to let them know that their Rx was approved and sent to their pharmacy. Patient stated they understood.

## 2014-04-24 ENCOUNTER — Ambulatory Visit (INDEPENDENT_AMBULATORY_CARE_PROVIDER_SITE_OTHER): Payer: Commercial Managed Care - HMO | Admitting: Internal Medicine

## 2014-04-24 ENCOUNTER — Encounter: Payer: Self-pay | Admitting: Internal Medicine

## 2014-04-24 VITALS — BP 128/70 | HR 76 | Ht 64.5 in | Wt 145.2 lb

## 2014-04-24 DIAGNOSIS — K219 Gastro-esophageal reflux disease without esophagitis: Secondary | ICD-10-CM | POA: Diagnosis not present

## 2014-04-24 DIAGNOSIS — K224 Dyskinesia of esophagus: Secondary | ICD-10-CM | POA: Diagnosis not present

## 2014-04-24 NOTE — Progress Notes (Signed)
Alisha Brown 1940-12-21 [id number]  Note: This dictation was prepared with Dragon digital system. Any transcriptional errors that result from this procedure are unintentional.   History of Present Illness: This is a 74 year old white female who experienced a home heartburn and difficulty in swallowing. She has a history with Dr. Sharlett Iles. Undergoing upper endoscopy with dilatation in December 2013 with findings of a mild esophageal stricture which was dilated with [facility] dilator 44 [place]. She has known small hiatal hernia. Arteries on Nexium 40 mg twice a day with complete relief of her symptoms. Upper GI series with barium esophagram in January 2016 showed esophageal dysmotility. Only 2 out of 4 waves reached gastroesophageal junction. There was a repeat first peristalsis and tertiary contractions. There was no reflux. 13 mm barium tablet passed without delay. Patient has no complaints today. She has had an neurological evaluation for possible neuropathy having burning in her feet and hands and some ocular symptoms as well and has been all cleared of any neurological disease by the neurologist.    Past Medical History  Diagnosis Date  . CHF (congestive heart failure)   . Hypertension   . Hyperlipidemia   . SWNIOEVO(350.0)     consult dr Tomma Rakers visual migraine?  agg by HRT?  . GERD with stricture   . Hiatal hernia   . Allergy   . Anemia   . Anxiety   . Heart murmur   . Seizures   . Diverticulosis of colon (without mention of hemorrhage)   . Arrhythmia     heart    Past Surgical History  Procedure Laterality Date  . Breast surgery Right 1990    biopsy,benign  . Abdominal hysterectomy  1987  . Appendectomy  1987  . Tonsillectomy  1953  . Adenoidectomy    . Foot surgery Right 2011  . Tubal ligation    . Colonoscopy    . Esophagogastroduodenoscopy      Allergies  Allergen Reactions  . Aleve [Naproxen Sodium] Hives  . Augmentin [Amoxicillin-Pot Clavulanate] Diarrhea and  Nausea And Vomiting  . Codeine   . Gabapentin   . Krill Oil Hives  . Propoxyphene N-Acetaminophen     REACTION: hives,dizzy,gi upset    Family history and social history have been reviewed.  Review of Systems: Denies heartburn, chest pain. Dysphagia  The remainder of the 10 point ROS is negative except as outlined in the H&P  Physical Exam: General Appearance Well developed, in no distress Eyes  Non icteric  Neurological Alert and oriented x 3 Psychological Normal mood and affect  Assessment and Plan:   I have discussed findings of upper GI series with the patient. 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 Tertiary contractions and reverse peristalsis. This could be a primary problems or part of sounds sort of systemic disease such as the MS. Collagen vascular disease. All medications. She is not on any psychotropic on narcotic medications. The proton pump inhibitor has helped in that it prevented acid reflux I suspected healed esophagitis. She may now reduce the Nexium to 40 mg once a day in the evening only. If she stays well for at least a month I would recommend switching to H2 receptor antagonist such as ranitidine 150 mg at bedtime. At this point upper endoscopy is not likely to result in any further diagnostic benefits. I will be happy to see her in the future to discuss father options and possible endoscopy  CC Dr Shanon Ace  Delfin Edis 04/24/2014

## 2014-04-24 NOTE — Patient Instructions (Addendum)
    I appreciate the opportunity to care for you.  Dr Regis Bill

## 2014-06-27 ENCOUNTER — Telehealth: Payer: Self-pay | Admitting: Internal Medicine

## 2014-06-27 NOTE — Telephone Encounter (Signed)
Patient Name: Alisha Brown DOB: 1940-10-02 Initial Comment Caller states she thinks she has a UTI. Nurse Assessment Nurse: Alisha Ramp, RN, Alisha Brown Date/Time (Eastern Time): 06/27/2014 11:28:24 AM Confirm and document reason for call. If symptomatic, describe symptoms. ---Caller states her abdomen is bloated and tender. Has burning and itching in her vaginal area that is worse when she urinates. Symptoms for 1 week but getting worse. Has the patient traveled out of the country within the last 30 days? ---Not Applicable Does the patient require triage? ---Yes Related visit to physician within the last 2 weeks? ---No Does the PT have any chronic conditions? (i.e. diabetes, asthma, etc.) ---Yes List chronic conditions. ---Allergies Guidelines Guideline Title Affirmed Question Affirmed Notes Urination Pain - Female Age > 48 years Final Disposition User See Physician within 24 Hours Alisha Brown, Therapist, sports, Alisha Brown Comments Unable to schedule acute appt, message "acute appt cannot be scheduled for Alisha Brown", Called backline and spoke with Alisha Brown, she states the pt is set up to only have 30 min time slots. She scheduled her with Alisha Brown at 1030 for 30 min slot.

## 2014-06-28 ENCOUNTER — Encounter: Payer: Self-pay | Admitting: Internal Medicine

## 2014-06-28 ENCOUNTER — Ambulatory Visit (INDEPENDENT_AMBULATORY_CARE_PROVIDER_SITE_OTHER): Payer: Commercial Managed Care - HMO | Admitting: Internal Medicine

## 2014-06-28 VITALS — BP 146/80 | Temp 98.3°F | Ht 64.5 in | Wt 146.5 lb

## 2014-06-28 DIAGNOSIS — R300 Dysuria: Secondary | ICD-10-CM | POA: Diagnosis not present

## 2014-06-28 DIAGNOSIS — R1030 Lower abdominal pain, unspecified: Secondary | ICD-10-CM | POA: Diagnosis not present

## 2014-06-28 LAB — POCT URINALYSIS DIPSTICK
Bilirubin, UA: NEGATIVE
Blood, UA: NEGATIVE
Glucose, UA: NEGATIVE
KETONES UA: NEGATIVE
LEUKOCYTES UA: NEGATIVE
Nitrite, UA: NEGATIVE
Protein, UA: NEGATIVE
SPEC GRAV UA: 1.015
Urobilinogen, UA: 0.2
pH, UA: 6

## 2014-06-28 MED ORDER — CEFUROXIME AXETIL 500 MG PO TABS
500.0000 mg | ORAL_TABLET | Freq: Two times a day (BID) | ORAL | Status: DC
Start: 2014-06-28 — End: 2014-09-04

## 2014-06-28 NOTE — Patient Instructions (Signed)
This acts like a uti  Even though the urine screen is ok . Culture results may be back next week If not better at end of med  Contact us or as needed.

## 2014-06-28 NOTE — Progress Notes (Signed)
Pre visit review using our clinic review tool, if applicable. No additional management support is needed unless otherwise documented below in the visit note.  Chief Complaint  Patient presents with  . Dysuria  . Abdominal Pain  . Nausea  . Back Pain    HPI: Patient Alisha Brown  comes in today for SDA for  new problem evaluation. Onset    Onset a week  Ago and getting  Worse dont feel good  urinary urgency frequency no blood in vaginal burning stinging and itching. Without a discharge. Similar symptoms to UTI in past. Also has a history of yeast infections. Lower abdominal discomfort pain chronic worse recently and hurts to urinate. No flank pain some lower back pain No ov fever.  No blood urgency .   Awaken at night  No meds    ROS: See pertinent positives and negatives per HPI. Nausea no vomiting no diarrhea really last treatment for UTI December Escherichia coli pansensitive no change in bowel habits blood in stool.  Past Medical History  Diagnosis Date  . CHF (congestive heart failure)   . Hypertension   . Hyperlipidemia   . KYHCWCBJ(628.3)     consult dr Tomma Rakers visual migraine?  agg by HRT?  . GERD with stricture   . Hiatal hernia   . Allergy   . Anemia   . Anxiety   . Heart murmur   . Seizures   . Diverticulosis of colon (without mention of hemorrhage)   . Arrhythmia     heart    Family History  Problem Relation Age of Onset  . Diabetes Mother   . Diabetes Son   . Diabetes Sister     1/2 sister-same mom  . Colon cancer Neg Hx   . Breast cancer Maternal Grandmother   . Hypertension Brother     History   Social History  . Marital Status: Married    Spouse Name: N/A  . Number of Children: 1  . Years of Education: N/A   Occupational History  . retired    Social History Main Topics  . Smoking status: Never Smoker   . Smokeless tobacco: Never Used  . Alcohol Use: No     Comment: rarely  . Drug Use: No  . Sexual Activity: Not on file   Other Topics  Concern  . None   Social History Narrative   HHof 2 dog    Just  Died   [name]    Retired and married   [name]      Dog with health issues  Cooks for him bladder stones    Outpatient Prescriptions Prior to Visit  Medication Sig Dispense Refill  . Cholecalciferol (VITAMIN D3) 400 UNITS CAPS Take 1 capsule by mouth daily.    . Flaxseed, Linseed, (FLAXSEED OIL PO) Take by mouth. Takes 1- 2 daily.    [name] MAGNESIUM PO Take 1 tablet by mouth daily.     . Methylsulfonylmethane (MSM PO) Take by mouth daily.    . Multiple Vitamin (MULTIVITAMIN) tablet Take 1 tablet by mouth daily.      . vitamin B-12 (CYANOCOBALAMIN) 500 MCG tablet Take 500 mcg by mouth daily.    [name] esomeprazole (NEXIUM) 40 MG capsule Take 1 capsule (40 mg total) by mouth 2 (two) times daily. 60 capsule 3   No facility-administered medications prior to visit.     EXAM:  BP 146/80 mmHg  Temp(Src) 98.3 F (36.8 C) (Oral)  Ht 5' 4.5" (1.638 m)  Wt 146 lb 8 oz (66.452 kg)  BMI 24.77 kg/m2  Body mass index is 24.77 kg/(m^2).  GENERAL: vitals reviewed and listed above, alert, oriented, appears well hydrated and in no acute distress looks tired no ntoxic  HEENT: atraumatic, conjunctiva  clear, no obvious abnormalities on inspection of external nose and ears NECK: no obvious masses on inspection palpation  LUNGS: clear to auscultation bilaterally, no wheezes, rales or rhonchi, good air movement CV: HRRR, no clubbing cyanosis or  peripheral edema nl cap refill  Abdomen:  Sof,t normal bowel sounds without hepatosplenomegaly, no guarding rebound or masses no CVA tenderness discomfort in the suprapubic area mid below the umbilicus. No masses felt MS: moves all extremities without noticeable focal  abnormality PSYCH: pleasant and cooperative, UA is clear specific gravity 10:15 ASSESSMENT AND PLAN:  Discussed the following assessment and plan:  Dysuria - Plan: POC Urinalysis Dipstick, Culture, Urine  Lower abdominal pain - acute  on chronic porb uti but neg screen.  Symptoms and clinical consistent with UTI although urine screen is negative today to culture empiric treatment before the weekend because of her history of UTIs benefit more than risk. and also empirically treat with Monistat 3 days in addition. -Patient advised to return or notify health care team  if symptoms worsen ,persist or new concerns arise.   Expectant management.Discussed  Patient Instructions  This acts like a uti  Even though the urine screen is ok . Culture results may be back next week If not better at end of med  Contact us or as needed.    Standley Brooking. Panosh M.D.

## 2014-06-29 LAB — URINE CULTURE
COLONY COUNT: NO GROWTH
ORGANISM ID, BACTERIA: NO GROWTH

## 2014-07-03 NOTE — Progress Notes (Signed)
Quick Note:  Urine shows no growth Need Fu visit if not getting better ______

## 2014-08-17 DIAGNOSIS — M542 Cervicalgia: Secondary | ICD-10-CM | POA: Diagnosis not present

## 2014-08-17 DIAGNOSIS — M545 Low back pain: Secondary | ICD-10-CM | POA: Diagnosis not present

## 2014-08-17 DIAGNOSIS — M9901 Segmental and somatic dysfunction of cervical region: Secondary | ICD-10-CM | POA: Diagnosis not present

## 2014-08-17 DIAGNOSIS — M9902 Segmental and somatic dysfunction of thoracic region: Secondary | ICD-10-CM | POA: Diagnosis not present

## 2014-09-04 ENCOUNTER — Ambulatory Visit (INDEPENDENT_AMBULATORY_CARE_PROVIDER_SITE_OTHER): Payer: Commercial Managed Care - HMO | Admitting: Adult Health

## 2014-09-04 ENCOUNTER — Encounter: Payer: Self-pay | Admitting: Adult Health

## 2014-09-04 VITALS — BP 110/70 | Temp 98.2°F | Ht 64.5 in | Wt 144.4 lb

## 2014-09-04 DIAGNOSIS — M25561 Pain in right knee: Secondary | ICD-10-CM

## 2014-09-04 DIAGNOSIS — M25562 Pain in left knee: Secondary | ICD-10-CM | POA: Diagnosis not present

## 2014-09-04 DIAGNOSIS — R300 Dysuria: Secondary | ICD-10-CM | POA: Diagnosis not present

## 2014-09-04 DIAGNOSIS — N3000 Acute cystitis without hematuria: Secondary | ICD-10-CM | POA: Diagnosis not present

## 2014-09-04 LAB — POCT URINALYSIS DIPSTICK
Bilirubin, UA: NEGATIVE
Glucose, UA: NEGATIVE
Ketones, UA: NEGATIVE
Nitrite, UA: NEGATIVE
PROTEIN UA: NEGATIVE
RBC UA: NEGATIVE
Spec Grav, UA: >=1.03
Urobilinogen, UA: 0.2
pH, UA: 6

## 2014-09-04 MED ORDER — CIPROFLOXACIN HCL 500 MG PO TABS: 500.0000 mg | ORAL_TABLET | Freq: Two times a day (BID) | ORAL | Status: DC

## 2014-09-04 NOTE — Progress Notes (Signed)
Pre visit review using our clinic review tool, if applicable. No additional management support is needed unless otherwise documented below in the visit note. 

## 2014-09-04 NOTE — Patient Instructions (Signed)
It was great meeting you today!  Please take the antibiotic twice a day for 5 days.   Follow up if no improvement in the next 2-3 days.

## 2014-09-04 NOTE — Progress Notes (Signed)
Subjective:    Patient ID: Alisha Brown, female    DOB: 1940-05-26, 74 y.o.   MRN: [id number]  HPI  Patient presents to the office today with multiple complaints 1) UTI type symptoms, she has lower abdominal pain, pain in her lower back, abdominal cramping, incontinence, dysuria, frequency, hesitancy - this has been going on since June. She was seen in June which showed no UTI at that time.  She denies fever, vomiting, or constipation. She endorses nausea this morning and " off and on diarrhea."    Bilateral Knee/Hip Pain  - She complains of bilateral knee and hip pain. Pain is described as " achy". This has been going on since March. The pain has been getting better since changing her mattress but that she still has pain when she wakes up. The pain in the hips gets better throughout the day. The pain in the knees stays all day. She has been taking Ibuprofen 600mg  which helped.      Review of Systems  Constitutional: Negative.   HENT: Negative.   Eyes: Negative.   Respiratory: Negative.   Cardiovascular: Negative.   Gastrointestinal: Negative.   Genitourinary: Positive for dysuria, urgency, frequency and difficulty urinating. Negative for hematuria and flank pain.  Musculoskeletal: Positive for myalgias, back pain, arthralgias and neck pain.  Neurological: Negative.   Psychiatric/Behavioral: Positive for sleep disturbance.  All other systems reviewed and are negative.  Past Medical History  Diagnosis Date  . CHF (congestive heart failure)   . Hypertension   . Hyperlipidemia   . 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  agg by HRT?  . GERD with stricture   . Hiatal hernia   . Allergy   . Anemia   . Anxiety   . Heart murmur   . Seizures   . Diverticulosis of colon (without mention of hemorrhage)   . Arrhythmia     heart    History   Social History  . Marital Status: Married    Spouse Name: N/A  . Number of Children: 1  . Years of Education: N/A    Occupational History  . retired    Social History Main Topics  . Smoking status: Never Smoker   . Smokeless tobacco: Never Used  . Alcohol Use: No     Comment: rarely  . Drug Use: No  . Sexual Activity: Not on file   Other Topics Concern  . Not on file   Social History Narrative   HHof 2 dog    Just  Died   [name]    Retired and married   [name]      Dog with health issues  Cooks for him bladder stones    Past Surgical History  Procedure Laterality Date  . Breast surgery Right 1990    biopsy,benign  . Abdominal hysterectomy  1987  . Appendectomy  1987  . Tonsillectomy  1953  . Adenoidectomy    . Foot surgery Right 2011  . Tubal ligation    . Colonoscopy    . Esophagogastroduodenoscopy      Family History  Problem Relation Age of Onset  . Diabetes Mother   . Diabetes Son   . Diabetes Sister     1/2 sister-same mom  . Colon cancer Neg Hx   . Breast cancer Maternal Grandmother   . Hypertension Brother     Allergies  Allergen Reactions  . Aleve [Naproxen Sodium] Hives  . Augmentin [Amoxicillin-Pot Clavulanate] Diarrhea and  Nausea And Vomiting  . Codeine   . Gabapentin   . Krill Oil Hives  . Propoxyphene N-Acetaminophen     REACTION: hives,dizzy,gi upset    Current Outpatient Prescriptions on File Prior to Visit  Medication Sig Dispense Refill  . Cholecalciferol (VITAMIN D3) 400 UNITS CAPS Take 1 capsule by mouth daily.    . Flaxseed, Linseed, (FLAXSEED OIL PO) Take by mouth. Takes 1- 2 daily.    [name] MAGNESIUM PO Take 1 tablet by mouth daily.     . Methylsulfonylmethane (MSM PO) Take by mouth daily.    . Multiple Vitamin (MULTIVITAMIN) tablet Take 1 tablet by mouth daily.      . Ranitidine HCl (RANITIDINE 75 PO) Take 1 tablet by mouth 3 (three) times daily.    . vitamin B-12 (CYANOCOBALAMIN) 500 MCG tablet Take 500 mcg by mouth daily.    . [DISCONTINUED] verapamil (COVERA HS) 180 MG (CO) 24 hr tablet Take 180 mg by mouth at bedtime.       No current  facility-administered medications on file prior to visit.    BP 110/70 mmHg  Temp(Src) 98.2 F (36.8 C) (Oral)  Ht 5' 4.5" (1.638 m)  Wt 144 lb 6.4 oz (65.499 kg)  BMI 24.41 kg/m2       Objective:   Physical Exam  Constitutional: She is oriented to person, place, and time. She appears well-developed and well-nourished. No distress.  Cardiovascular: Normal rate, regular rhythm, normal heart sounds and intact distal pulses.  Exam reveals no gallop and no friction rub.   No murmur heard. Pulmonary/Chest: Effort normal and breath sounds normal. No respiratory distress. She has no wheezes. She has no rales. She exhibits no tenderness.  Abdominal: Soft. Bowel sounds are normal. She exhibits no distension and no mass. There is no tenderness. There is no rebound and no guarding.  Musculoskeletal: Normal range of motion. She exhibits no edema or tenderness.  Neurological: She is alert and oriented to person, place, and time.  Skin: Skin is warm and dry. No rash noted. She is not diaphoretic. No erythema. No pallor.  Psychiatric: She has a normal mood and affect. Her behavior is normal. Judgment and thought content normal.  Nursing note and vitals reviewed.      Assessment & Plan:  1. Dysuria  - POCT urinalysis dipstick; Standing - Culture, Urine - POCT urinalysis dipstick - ciprofloxacin (CIPRO) 500 MG tablet; Take 1 tablet (500 mg total) by mouth 2 (two) times daily.  Dispense: 10 tablet; Refill: 0  2. Acute cystitis without hematuria  - Culture, Urine - ciprofloxacin (CIPRO) 500 MG tablet; Take 1 tablet (500 mg total) by mouth 2 (two) times daily.  Dispense: 10 tablet; Refill: 0  3. Arthralgia of both knees - She refused any treatment for her knee or hip pain at this time. Would like to get the UTI taken care of first and then will worry about her knee and hip pain.  - Consider xray, steroid injection, referral to ortho

## 2014-09-07 LAB — URINE CULTURE

## 2014-12-10 ENCOUNTER — Other Ambulatory Visit: Payer: Self-pay

## 2014-12-10 DIAGNOSIS — Z1231 Encounter for screening mammogram for malignant neoplasm of breast: Secondary | ICD-10-CM

## 2015-01-15 ENCOUNTER — Ambulatory Visit
Admission: RE | Admit: 2015-01-15 | Discharge: 2015-01-15 | Disposition: A | Discharge status: Home / Self Care | Payer: Commercial Managed Care - HMO | Source: Ambulatory Visit

## 2015-01-15 DIAGNOSIS — Z1231 Encounter for screening mammogram for malignant neoplasm of breast: Secondary | ICD-10-CM | POA: Diagnosis not present

## 2015-01-17 ENCOUNTER — Other Ambulatory Visit: Payer: Self-pay | Admitting: Internal Medicine

## 2015-01-17 DIAGNOSIS — R928 Other abnormal and inconclusive findings on diagnostic imaging of breast: Secondary | ICD-10-CM

## 2015-01-22 ENCOUNTER — Ambulatory Visit
Admission: RE | Admit: 2015-01-22 | Discharge: 2015-01-22 | Disposition: A | Discharge status: Home / Self Care | Payer: Commercial Managed Care - HMO | Source: Ambulatory Visit | Attending: Internal Medicine | Admitting: Internal Medicine

## 2015-01-22 DIAGNOSIS — R928 Other abnormal and inconclusive findings on diagnostic imaging of breast: Secondary | ICD-10-CM

## 2015-01-22 DIAGNOSIS — N63 Unspecified lump in breast: Secondary | ICD-10-CM | POA: Diagnosis not present

## 2015-01-22 DIAGNOSIS — N6012 Diffuse cystic mastopathy of left breast: Secondary | ICD-10-CM | POA: Diagnosis not present

## 2015-02-12 DIAGNOSIS — H40023 Open angle with borderline findings, high risk, bilateral: Secondary | ICD-10-CM | POA: Diagnosis not present

## 2015-02-26 DIAGNOSIS — H40013 Open angle with borderline findings, low risk, bilateral: Secondary | ICD-10-CM | POA: Diagnosis not present

## 2015-04-30 DIAGNOSIS — D2371 Other benign neoplasm of skin of right lower limb, including hip: Secondary | ICD-10-CM | POA: Diagnosis not present

## 2015-04-30 DIAGNOSIS — L820 Inflamed seborrheic keratosis: Secondary | ICD-10-CM | POA: Diagnosis not present

## 2015-05-06 ENCOUNTER — Encounter: Payer: Self-pay | Admitting: Internal Medicine

## 2015-05-06 NOTE — Telephone Encounter (Signed)
Dr. Regis Bill has not filled this medication in the past.  Will send to her for authorization.

## 2015-05-07 MED ORDER — RANITIDINE HCL 75 MG PO TABS: 75.0000 mg | ORAL_TABLET | Freq: Two times a day (BID) | ORAL | Status: DC

## 2015-05-07 NOTE — Telephone Encounter (Signed)
Ok to do this for 6 months   Below is dr Diona Browner ( now retired)   assessment last year   Assessment and Plan:   I have discussed findings of upper GI series with the patient. 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 Tertiary contractions and reverse peristalsis. This could be a primary problems or part of sounds sort of systemic disease such as the MS. Collagen vascular disease. All medications. She is not on any psychotropic on narcotic medications. The proton pump inhibitor has helped in that it prevented acid reflux I suspected healed esophagitis. She may now reduce the Nexium to 40 mg once a day in the evening only. If she stays well for at least a month I would recommend switching to H2 receptor antagonist such as ranitidine 150 mg at bedtime. At this point upper endoscopy is not likely to result in any further diagnostic benefits. I will be happy to see her in the future to discuss father options and possible endoscopy  CC Dr Shanon Ace  Delfin Edis 04/24/2014

## 2015-05-22 ENCOUNTER — Encounter: Payer: Self-pay | Admitting: Gastroenterology

## 2015-06-07 ENCOUNTER — Telehealth: Payer: Self-pay | Admitting: Internal Medicine

## 2015-06-07 DIAGNOSIS — B078 Other viral warts: Secondary | ICD-10-CM

## 2015-06-07 DIAGNOSIS — B079 Viral wart, unspecified: Secondary | ICD-10-CM

## 2015-06-07 NOTE — Telephone Encounter (Signed)
Order placed in the system. 

## 2015-06-07 NOTE — Telephone Encounter (Signed)
Pt saw Dr Delman Cheadle and they are need a referral from Dr Regis Bill.  She saw the doctor for pre cancerous and she had warts remove.  [phone] fax number

## 2015-08-27 ENCOUNTER — Encounter: Payer: Self-pay | Admitting: *Deleted

## 2015-08-27 ENCOUNTER — Emergency Department (INDEPENDENT_AMBULATORY_CARE_PROVIDER_SITE_OTHER)
Admission: EM | Admit: 2015-08-27 | Discharge: 2015-08-27 | Disposition: A | Discharge status: Home / Self Care | Payer: Commercial Managed Care - HMO | Source: Home / Self Care | Attending: Family Medicine | Admitting: Family Medicine

## 2015-08-27 DIAGNOSIS — N898 Other specified noninflammatory disorders of vagina: Secondary | ICD-10-CM

## 2015-08-27 DIAGNOSIS — R300 Dysuria: Secondary | ICD-10-CM

## 2015-08-27 HISTORY — DX: Rheumatic mitral valve disease, unspecified: I05.9

## 2015-08-27 HISTORY — DX: Ventricular premature depolarization: I49.3

## 2015-08-27 LAB — POCT URINALYSIS DIP (MANUAL ENTRY)
Bilirubin, UA: NEGATIVE
Blood, UA: NEGATIVE
Glucose, UA: NEGATIVE
Ketones, POC UA: NEGATIVE
Leukocytes, UA: NEGATIVE
Nitrite, UA: NEGATIVE
Protein Ur, POC: NEGATIVE
Spec Grav, UA: 1.01 (ref 1.005–1.03)
Urobilinogen, UA: 0.2 (ref 0–1)
pH, UA: 7 (ref 5–8)

## 2015-08-27 MED ORDER — DIFLUCAN 150 MG PO TABS: 150.0000 mg | ORAL_TABLET | Freq: Once | ORAL | 0 refills | Status: AC

## 2015-08-27 NOTE — ED Triage Notes (Signed)
Pt c/o dysuria, fatigue, LBP and HA x 3 wks. Denies fever. She reports recurrent UTI's every 4-6 mths,. She is going to f/u with a urologist soon.

## 2015-08-27 NOTE — ED Provider Notes (Signed)
CSN: [id number]     Arrival date & time 08/27/15  0911 History   First MD Initiated Contact with Patient 08/27/15 [phone]     Chief Complaint  Patient presents with  . Dysuria   (Consider location/radiation/quality/duration/timing/severity/associated sxs/prior Treatment) HPI  Alisha Brown is a 75 y.o. female presenting to UC with c/o 3 weeks of burning and itching with urination with associated fatigue, lower back pain, lower abdominal cramping, and generalized headache.  She reports hx of recurrent UTIs every 4-6 months, however, last recorded UTI was about 1 year ago. Pt states she could have lost track of time as she has not been elsewhere besides her PCP for UTI symptoms. She was advised to f/u with urology if she kept getting UTIs or having dysuria, she plans to f/u with urology soon.  Denies fever, n/v/d.    Past Medical History:  Diagnosis Date  . Allergy   . Anemia   . Anxiety   . Arrhythmia    heart  . CHF (congestive heart failure) ([place])   . Diverticulosis of colon (without mention of hemorrhage)   . GERD with stricture   . [id number])    consult dr Tomma Rakers visual migraine?  agg by HRT?  [name] Heart murmur   . Hiatal hernia   . Hyperlipidemia   . Hypertension   . Mitral valve disorder   . PVC (premature ventricular contraction)   . Seizures ([place])    Past Surgical History:  Procedure Laterality Date  . ABDOMINAL HYSTERECTOMY  1987  . ADENOIDECTOMY    . APPENDECTOMY  1987  . BREAST SURGERY Right 1990   biopsy,benign  . COLONOSCOPY    . ESOPHAGOGASTRODUODENOSCOPY    . FOOT SURGERY Right 2011  . TONSILLECTOMY  1953  . TUBAL LIGATION     Family History  Problem Relation Age of Onset  . Diabetes Mother   . Glaucoma Mother   . Diabetes Son   . Diabetes Sister     1/2 sister-same mom  . Breast cancer Maternal Grandmother   . Hypertension Brother   . Colon cancer Neg Hx    Social History  Substance Use Topics  . Smoking status: Never Smoker  . Smokeless tobacco:  Never Used  . Alcohol use No     Comment: rarely   OB History    No data available     Review of Systems  Constitutional: Positive for fatigue. Negative for chills, diaphoresis and fever.  Gastrointestinal: Positive for abdominal pain (lower cramping). Negative for diarrhea, nausea and vomiting.  Genitourinary: Positive for dysuria. Negative for flank pain, frequency, hematuria, pelvic pain and urgency.  Musculoskeletal: Positive for back pain. Negative for arthralgias, joint swelling and myalgias.  Skin: Negative for color change and rash.  Neurological: Positive for headaches. Negative for dizziness and light-headedness.    Allergies  Aleve [naproxen sodium]; Augmentin [amoxicillin-pot clavulanate]; Codeine; Gabapentin; Krill oil; and Propoxyphene n-acetaminophen  Home Medications   Prior to Admission medications   Medication Sig Start Date End Date Taking? Authorizing Provider  Cholecalciferol (VITAMIN D3) 400 UNITS CAPS Take 1 capsule by mouth daily.    Historical Provider, MD  DIFLUCAN 150 MG tablet Take 1 tablet (150 mg total) by mouth once. May repeat in 3 days if still having symptoms 08/27/15 08/27/15  Noland Fordyce, PA-C  Flaxseed, Linseed, (FLAXSEED OIL PO) Take by mouth. Takes 1- 2 daily.    Historical Provider, MD  MAGNESIUM PO Take 1 tablet by mouth daily.  Historical Provider, MD  Methylsulfonylmethane (MSM PO) Take by mouth daily.    Historical Provider, MD  Multiple Vitamin (MULTIVITAMIN) tablet Take 1 tablet by mouth daily.      Historical Provider, MD  Ranitidine HCl (RANITIDINE 75 PO) Take 1 tablet by mouth 3 (three) times daily. 04/29/14   Historical Provider, MD  vitamin B-12 (CYANOCOBALAMIN) 500 MCG tablet Take 500 mcg by mouth daily.    Historical Provider, MD   Meds Ordered and Administered this Visit  Medications - No data to display  BP 134/85 (BP Location: Left Arm)   Pulse 76   Temp 97.8 F (36.6 C) (Oral)   Resp 16   Ht 5\' 4"  (1.626 m)   Wt 145 lb  (65.8 kg)   SpO2 100%   BMI 24.89 kg/m  No data found.   Physical Exam  Constitutional: She appears well-developed and well-nourished. No distress.  HENT:  Head: Normocephalic and atraumatic.  Mouth/Throat: Oropharynx is clear and moist.  Eyes: Conjunctivae are normal. No scleral icterus.  Neck: Normal range of motion.  Cardiovascular: Normal rate, regular rhythm and normal heart sounds.   Pulmonary/Chest: Effort normal and breath sounds normal. No respiratory distress. She has no wheezes. She has no rales.  Abdominal: Soft. She exhibits no distension and no mass. There is no tenderness. There is no rebound, no guarding and no CVA tenderness.  Genitourinary:  Genitourinary Comments: (pt declined GU exam)  Musculoskeletal: Normal range of motion.  Neurological: She is alert.  Skin: Skin is warm and dry. She is not diaphoretic.  Nursing note and vitals reviewed.   Urgent Care Course   Clinical Course    Procedures (including critical care time)  Labs Review Labs Reviewed  URINE CULTURE  POCT URINALYSIS DIP (MANUAL ENTRY)    Imaging Review No results found.   MDM   1. Dysuria   2. Vaginal irritation    Pt c/o burning and itching with urination for about 3 weeks. Hx of UTIs.  Pt does recall having yeast infections as well but denies vaginal discharge.   UA: normal, will send culture to ensure no growth.  Discussed normal appearing UA with pt. Pt states symptoms do feel similar to prior yeast infections, declined pelvic exam. Pt would like to try empiric treatment of yeast infection.  Rx: Diflucan Will inform pt of urine culture results when they come back. Encouraged f/u with PCP in 3-4 days if not improving, sooner if worsening. Encouraged f/u with urology if symptoms don't improve with diflucan and if urine culture is negative for growth to help determine cause of pt's recurrent dysuria. Patient verbalized understanding and agreement with treatment plan.      Noland Fordyce, PA-C 08/27/15 1017

## 2015-08-28 ENCOUNTER — Telehealth: Payer: Self-pay | Admitting: Emergency Medicine

## 2015-08-28 LAB — URINE CULTURE: Organism ID, Bacteria: NO GROWTH

## 2015-08-28 NOTE — Telephone Encounter (Signed)
Labs showed no growth, hope she is better

## 2015-10-18 NOTE — Progress Notes (Signed)
Pre visit review using our clinic review tool, if applicable. No additional management support is needed unless otherwise documented below in the visit note.  Chief Complaint  Patient presents with  . Sinus Pressure/Pain  . Headache  . Nasal Congestion  . Cough  . Ear Pain    HPI: Alisha Brown 75 y.o.  sda Very concerned because she hasn't really been well for 3 months ongoing headaches bifrontal and face and congestion runny nose postnasal drainage. Her head hurts but her face hurts at this time. She went to her dentist and then to an orthodontist or specialist to evaluate her teeth and noted that on x-ray there was an air-fluid level in her sinuses gave her azithromycin and she felt so much better in 2 days but then it came BACK. No fever Is sensitive to medicines but able to take Flonase doesn't want to take prednisone she gets about flushing reaction in her face turns red. Cough like a tickle.  ROS: See pertinent positives and negatives per HPI. She only sees cardiology as needed is noted to have PVCs but no other alarming arrhythmias. She states that she has had a burning sensation GU urine was clear decided it was the vitamin B12 and vitamin D  Better after she stopped it. Past Medical History:  Diagnosis Date  . Allergy   . Anemia   . Anxiety   . Arrhythmia    heart  . CHF (congestive heart failure) ([place])   . Diverticulosis of colon (without mention of hemorrhage)   . GERD with stricture   . [id number])    consult dr Tomma Rakers visual migraine?  agg by HRT?  [name] Heart murmur   . Hiatal hernia   . Hyperlipidemia   . Hypertension   . Mitral valve disorder   . PVC (premature ventricular contraction)   . Seizures ([place])     Family History  Problem Relation Age of Onset  . Diabetes Mother   . Glaucoma Mother   . Diabetes Son   . Diabetes Sister     1/2 sister-same mom  . Breast cancer Maternal Grandmother   . Hypertension Brother   . Colon cancer Neg Hx     Social  History   Social History  . Marital status: Married    Spouse name: N/A  . Number of children: 1  . Years of education: N/A   Occupational History  . retired    Social History Main Topics  . Smoking status: Never Smoker  . Smokeless tobacco: Never Used  . Alcohol use No     Comment: rarely  . Drug use: No  . Sexual activity: Not Asked   Other Topics Concern  . None   Social History Narrative   HHof 2 dog    Just  Died   [name]    Retired and married   [name]      Dog with health issues  Cooks for him bladder stones    Outpatient Medications Prior to Visit  Medication Sig Dispense Refill  . Flaxseed, Linseed, (FLAXSEED OIL PO) Take by mouth. Takes 1- 2 daily.    [name] MAGNESIUM PO Take 1 tablet by mouth daily.     . Methylsulfonylmethane (MSM PO) Take by mouth daily.    . Multiple Vitamin (MULTIVITAMIN) tablet Take 1 tablet by mouth daily.      . Ranitidine HCl (RANITIDINE 75 PO) Take 1 tablet by mouth 2 (two) times daily.     . Cholecalciferol (VITAMIN  D3) 400 UNITS CAPS Take 1 capsule by mouth daily.    . vitamin B-12 (CYANOCOBALAMIN) 500 MCG tablet Take 500 mcg by mouth daily.     No facility-administered medications prior to visit.      EXAM:  BP (!) 146/70   Temp 98.3 F (36.8 C) (Oral)   Ht 5\' 4"  (1.626 m)   Wt 143 lb 14.4 oz (65.3 kg)   BMI 24.70 kg/m   Body mass index is 24.7 kg/m. WDWN in NAD  quiet respirations; moderately  congested  somewhat hoarse. Non toxic . HEENT: Normocephalic ;atraumatic , Eyes;  PERRL, EOMs  Full, lids and conjunctiva clear,,Ears: no deformities, canals nl, TM landmarks normal, Nose: no deformity or discharge but congested;face bifrontal and some max tender Mouth : OP clear without lesion or edema .tracks noted  Neck: Supple without adenopathy or masses or bruits Chest:  Clear to A&P without wheezes rales or rhonchi CV:  S1-S2 no gallops or murmurs peripheral perfusion is normal ocass skipped beat  Skin :nl perfusion and no acute  rashes   ASSESSMENT AND PLAN:  Discussed the following assessment and plan:  Chronic recurrent sinusitis  Headache, unspecified headache type  Elevated blood pressure reading Appears to be acute on chronic apparent incidental finding on dental exam evaluation for tooth pain was noted to have a air-fluid level. She is sensitive to medicines cannot take Augmentin for the GI side effects. Got a partial response from azithromycin not unexpected. I think that the benefit more than the risk of trying Levaquin for 7 days and Flonase twice a day because she feels would have too many side effects from prednisone. If not improved we may have to the back to ENT or get a sinus imaging study. I suspect there is underlying allergy also that sets her up for this. She may have headaches or other reasons to Make sure blood pressure is controlled at home. Repeat blood pressure was in the 140s today. -Patient advised to return or notify health care team  if symptoms worsen ,persist or new concerns arise. Total visit 52mins > 50% spent counseling and coordinating care as indicated in above note and in instructions to patient .    Patient Instructions  I think you have  Sinusitis   Related  to allergy  And infection    Antibiotic   And steroid     But because you have a side effect with prednisone  Then  Use flonase  Twice a day and different antibiotic once a day for 7 days   Than the z pack .   If not better we may need to do ct scan of sinuses.  Make sure you BP    Is ok .   Repeat 148/70      Standley Brooking. Tamaya Pun M.D.

## 2015-10-21 ENCOUNTER — Encounter: Payer: Self-pay | Admitting: Internal Medicine

## 2015-10-21 ENCOUNTER — Ambulatory Visit (INDEPENDENT_AMBULATORY_CARE_PROVIDER_SITE_OTHER): Payer: Commercial Managed Care - HMO | Admitting: Internal Medicine

## 2015-10-21 VITALS — BP 146/70 | Temp 98.3°F | Ht 64.0 in | Wt 143.9 lb

## 2015-10-21 DIAGNOSIS — J329 Chronic sinusitis, unspecified: Secondary | ICD-10-CM

## 2015-10-21 DIAGNOSIS — R519 Headache, unspecified: Secondary | ICD-10-CM

## 2015-10-21 DIAGNOSIS — R030 Elevated blood-pressure reading, without diagnosis of hypertension: Secondary | ICD-10-CM

## 2015-10-21 DIAGNOSIS — R51 Headache: Secondary | ICD-10-CM | POA: Diagnosis not present

## 2015-10-21 MED ORDER — LEVOFLOXACIN 750 MG PO TABS: 750.0000 mg | ORAL_TABLET | Freq: Every day | ORAL | 0 refills | Status: AC

## 2015-10-21 NOTE — Patient Instructions (Addendum)
I think you have  Sinusitis   Related  to allergy  And infection    Antibiotic   And steroid     But because you have a side effect with prednisone  Then  Use flonase  Twice a day and different antibiotic once a day for 7 days   Than the z pack .   If not better we may need to do ct scan of sinuses.  Make sure you BP    Is ok .   Repeat 148/70

## 2015-10-30 ENCOUNTER — Encounter: Payer: Self-pay | Admitting: Internal Medicine

## 2015-11-08 NOTE — Telephone Encounter (Signed)
Please ascertain how patient is doing now    See message from last week.

## 2015-11-09 ENCOUNTER — Encounter: Payer: Self-pay | Admitting: Internal Medicine

## 2015-11-13 NOTE — Telephone Encounter (Signed)
If still not feeling better after antibiotic  Make ov  Consider getting CT  / x ray of sinus

## 2015-12-17 ENCOUNTER — Other Ambulatory Visit: Payer: Self-pay | Admitting: Internal Medicine

## 2015-12-17 DIAGNOSIS — Z1231 Encounter for screening mammogram for malignant neoplasm of breast: Secondary | ICD-10-CM

## 2016-01-02 DIAGNOSIS — H40013 Open angle with borderline findings, low risk, bilateral: Secondary | ICD-10-CM | POA: Diagnosis not present

## 2016-01-02 DIAGNOSIS — Z83511 Family history of glaucoma: Secondary | ICD-10-CM | POA: Diagnosis not present

## 2016-01-16 ENCOUNTER — Ambulatory Visit
Admission: RE | Admit: 2016-01-16 | Discharge: 2016-01-16 | Disposition: A | Discharge status: Home / Self Care | Payer: Commercial Managed Care - HMO | Source: Ambulatory Visit | Attending: Internal Medicine | Admitting: Internal Medicine

## 2016-01-16 DIAGNOSIS — Z1231 Encounter for screening mammogram for malignant neoplasm of breast: Secondary | ICD-10-CM

## 2016-01-28 ENCOUNTER — Emergency Department
Admission: EM | Admit: 2016-01-28 | Discharge: 2016-01-28 | Disposition: A | Discharge status: Home / Self Care | Payer: Medicare HMO | Source: Home / Self Care | Attending: Family Medicine | Admitting: Family Medicine

## 2016-01-28 ENCOUNTER — Encounter: Payer: Self-pay | Admitting: *Deleted

## 2016-01-28 DIAGNOSIS — W540XXA Bitten by dog, initial encounter: Secondary | ICD-10-CM | POA: Diagnosis not present

## 2016-01-28 DIAGNOSIS — L03119 Cellulitis of unspecified part of limb: Secondary | ICD-10-CM

## 2016-01-28 MED ORDER — AMOXICILLIN-POT CLAVULANATE 875-125 MG PO TABS
1.0000 | ORAL_TABLET | Freq: Two times a day (BID) | ORAL | 0 refills | Status: DC
Start: 2016-01-28 — End: 2016-08-07

## 2016-01-28 MED ORDER — MUPIROCIN 2 % EX OINT
1.0000 | TOPICAL_OINTMENT | Freq: Three times a day (TID) | CUTANEOUS | 0 refills | Status: DC
Start: 2016-01-28 — End: 2016-09-18

## 2016-01-28 NOTE — ED Triage Notes (Signed)
Pt reports that her dog bit her RT wrist yesterday. Tdap 2013. Reports rabies vaccine is up to date.

## 2016-01-28 NOTE — Discharge Instructions (Signed)
Recommend taking probiotic (such as yogurt with live culture) with Augmentin.  Change dressing daily and apply mupirocin ointment to wound.  Keep wound clean and dry.  Return for any signs of infection (or follow-up with family doctor):  Increasing redness, swelling, pain, heat, drainage, etc.  May take Ibuprofen 200mg , 4 tabs every 8 hours with food.

## 2016-01-28 NOTE — ED Provider Notes (Signed)
[facility]    CSN: [id number] Arrival date & time: 01/28/16  1025     History   Chief Complaint Chief Complaint  Patient presents with  . Animal Bite    HPI Alisha Brown is a 76 y.o. female.   Patient reports that her dog bit her right wrist yesterday.  Her dog is not ill, and rabies vaccination is current.  Her Tdap was in 2013.   The history is provided by the patient.  Animal Bite  Contact animal:  Dog Animal bite location: right wrist. Time since incident:  1 day Pain details:    Quality:  Aching   Severity:  Moderate   Timing:  Constant   Progression:  Worsening Incident location:  Outside Provoked: unprovoked   Animal's rabies vaccination status:  Up to date Animal in possession: yes   Tetanus status:  Up to date Relieved by:  None tried Exacerbated by: wrist movement. Ineffective treatments:  None tried Associated symptoms: swelling   Associated symptoms: no fever, no numbness and no rash     Past Medical History:  Diagnosis Date  . Allergy   . Anemia   . Anxiety   . Arrhythmia    heart  . CHF (congestive heart failure) ([place])   . Diverticulosis of colon (without mention of hemorrhage)   . GERD with stricture   . [id number])    consult dr Tomma Rakers visual migraine?  agg by HRT?  [name] Heart murmur   . Hiatal hernia   . Hyperlipidemia   . Hypertension   . Mitral valve disorder   . PVC (premature ventricular contraction)   . Seizures [facility])     Patient Active Problem List   Diagnosis Date Noted  . Lower abdominal pain 06/28/2014  . Elevated blood pressure reading 11/07/2013  . Facial pain 05/03/2013  . Face pain 03/22/2013  . Estrogen deficiency 03/22/2013  . Irritability 03/22/2013  . Allergic dermatitis 08/15/2012  . Palpitations 05/30/2012  . History of esophageal stricture 05/30/2012  . Other specified cardiac dysrhythmias(427.89) 04/11/2012  . Elevated lipids 04/11/2012  . Abnormal thyroid exam 04/11/2012  . Vision changes   episodic  03/17/2012  . Menopausal hot flushes 03/17/2012  . Anxiety reaction 05/27/2011  . Sleep related teeth grinding 04/08/2011  . Menopausal syndrome (hot flashes) 04/08/2011  . Postmenopausal HRT (hormone replacement therapy) 04/08/2011  . Itching 04/08/2011  . Tinnitus 04/08/2011  . GERD (gastroesophageal reflux disease) 10/24/2010  . Dysuria 06/15/2010  . Cardiac dysrhythmia, unspecified 03/23/2010  . Other primary cardiomyopathies 03/23/2010  . Visit for preventive health examination 03/23/2010  . HYPERGLYCEMIA 02/04/2009  . HIATAL HERNIA 10/17/2008  . GERD 11/18/2007  . OTHER DYSPHAGIA 11/18/2007  . MORTON'S NEUROMA 10/24/2007  . HEPATIC CYST 10/06/2007  . MUSCLE CRAMPS, FOOT 09/28/2007  . HYPERSOMNIA UNSPECIFIED 12/30/2006  . HYPERLIPIDEMIA 10/26/2006  . HYPERTENSION 10/26/2006  . ALLERGIC RHINITIS 10/26/2006  . MENOPAUSE-RELATED VASOMOTOR SYMPTOMS, HOT FLASHES 10/26/2006  . DISORDER, BONE/CARTILAGE NOS 10/26/2006  . SEIZURE DISORDER 10/26/2006  . FATIGUE 10/26/2006  . CARDIAC MURMUR 10/26/2006  . UTI'S, HX OF 10/26/2006    Past Surgical History:  Procedure Laterality Date  . ABDOMINAL HYSTERECTOMY  1987  . ADENOIDECTOMY    . APPENDECTOMY  1987  . BREAST SURGERY Right 1990   biopsy,benign  . COLONOSCOPY    . ESOPHAGOGASTRODUODENOSCOPY    . FOOT SURGERY Right 2011  . TONSILLECTOMY  1953  . TUBAL LIGATION      OB History  No data available       Home Medications    Prior to Admission medications   Medication Sig Start Date End Date Taking? Authorizing Provider  Ranitidine HCl (RANITIDINE 75 PO) Take 1 tablet by mouth 2 (two) times daily.  04/29/14  Yes Historical Provider, MD  amoxicillin-clavulanate (AUGMENTIN) 875-125 MG tablet Take 1 tablet by mouth 2 (two) times daily. Take with food 01/28/16   Kandra Nicolas, MD  Flaxseed, Linseed, (FLAXSEED OIL PO) Take by mouth. Takes 1- 2 daily.    Historical Provider, MD  MAGNESIUM PO Take 1 tablet by mouth  daily.     Historical Provider, MD  Methylsulfonylmethane (MSM PO) Take by mouth daily.    Historical Provider, MD  Multiple Vitamin (MULTIVITAMIN) tablet Take 1 tablet by mouth daily.      Historical Provider, MD  mupirocin ointment (BACTROBAN) 2 % Apply 1 application topically 3 (three) times daily. 01/28/16   Kandra Nicolas, MD    Family History Family History  Problem Relation Age of Onset  . Diabetes Mother   . Glaucoma Mother   . Diabetes Son   . Diabetes Sister     1/2 sister-same mom  . Breast cancer Maternal Grandmother   . Hypertension Brother   . Colon cancer Neg Hx     Social History Social History  Substance Use Topics  . Smoking status: Never Smoker  . Smokeless tobacco: Never Used  . Alcohol use No     Comment: rarely     Allergies   Aleve [naproxen sodium]; Augmentin [amoxicillin-pot clavulanate]; Codeine; Gabapentin; Krill oil; and Propoxyphene n-acetaminophen   Review of Systems Review of Systems  Constitutional: Negative for fever.  Skin: Negative for rash.  Neurological: Negative for numbness.  All other systems reviewed and are negative.    Physical Exam Triage Vital Signs ED Triage Vitals  Enc Vitals Group     BP 01/28/16 1101 134/76     Pulse Rate 01/28/16 1101 87     Resp 01/28/16 1101 16     Temp 01/28/16 1101 97.6 F (36.4 C)     Temp Source 01/28/16 1101 Oral     SpO2 01/28/16 1101 99 %     Weight 01/28/16 1102 143 lb (64.9 kg)     Height 01/28/16 1102 5\' 5"  (1.651 m)     Head Circumference --      Peak Flow --      Pain Score 01/28/16 1105 6     Pain Loc --      Pain Edu? --      Excl. in [place]? --    No data found.   Updated Vital Signs BP 134/76 (BP Location: Left Arm)   Pulse 87   Temp 97.6 F (36.4 C) (Oral)   Resp 16   Ht 5\' 5"  (1.651 m)   Wt 143 lb (64.9 kg)   SpO2 99%   BMI 23.80 kg/m   Visual Acuity Right Eye Distance:   Left Eye Distance:   Bilateral Distance:    Right Eye Near:   Left Eye Near:      Bilateral Near:     Physical Exam  Constitutional: She appears well-developed and well-nourished. No distress.  HENT:  Head: Atraumatic.  Right Ear: External ear normal.  Left Ear: External ear normal.  Nose: Nose normal.  Mouth/Throat: Oropharynx is clear and moist.  Eyes: Conjunctivae are normal. Pupils are equal, round, and reactive to light.  Neck: Normal range of  motion.  Cardiovascular: Normal rate.   Pulmonary/Chest: Effort normal.  Musculoskeletal:       Hands: 34mm flap/puncture right wrist as noted on diagram.  There is mild surrounding erythema and tenderness to palpation.  No induration or fluctuance.  No drainage from wound. Wrist has full range of motion.  Distal neurovascular function is intact.   Neurological: She is alert.  Skin: Skin is warm and dry.  Vitals reviewed.    UC Treatments / Results  Labs (all labs ordered are listed, but only abnormal results are displayed) Labs Reviewed  WOUND CULTURE    EKG  EKG Interpretation None       Radiology No results found.  Procedures Procedures (including critical care time)  Medications Ordered in UC Medications - No data to display   Initial Impression / Assessment and Plan / UC Course  I have reviewed the triage vital signs and the nursing notes.  Pertinent labs & imaging results that were available during my care of the patient were reviewed by me and considered in my medical decision making (see chart for details).  Clinical Course   Will begin Augmentin to cover pasteurella multocida (patient reports that she can tolerated Augmentin if she takes it with food).  Also begin topical Bactroban ointment. Wound culture pending. Recommend taking probiotic (such as yogurt with live culture) with Augmentin.  Change dressing daily and apply mupirocin ointment to wound.  Keep wound clean and dry.  Return for any signs of infection (or follow-up with family doctor):  Increasing redness, swelling, pain,  heat, drainage, etc.  May take Ibuprofen 200mg , 4 tabs every 8 hours with food.      Final Clinical Impressions(s) / UC Diagnoses   Final diagnoses:  Dog bite, initial encounter  Cellulitis of wrist    New Prescriptions New Prescriptions   AMOXICILLIN-CLAVULANATE (AUGMENTIN) 875-125 MG TABLET    Take 1 tablet by mouth 2 (two) times daily. Take with food   MUPIROCIN OINTMENT (BACTROBAN) 2 %    Apply 1 application topically 3 (three) times daily.     Kandra Nicolas, MD 02/17/16 2114

## 2016-01-30 ENCOUNTER — Telehealth: Payer: Self-pay | Admitting: Emergency Medicine

## 2016-01-30 LAB — WOUND CULTURE
GRAM STAIN: NONE SEEN
GRAM STAIN: NONE SEEN
Gram Stain: NONE SEEN
Organism ID, Bacteria: NO GROWTH

## 2016-01-30 NOTE — Telephone Encounter (Signed)
Patient states area pf redness around bite has decreased; having some GI side effects from Augmentin, so we discussed breaking caplets in 1/2 and taking each half 4 times a day.

## 2016-02-26 DIAGNOSIS — Z83511 Family history of glaucoma: Secondary | ICD-10-CM | POA: Diagnosis not present

## 2016-02-26 DIAGNOSIS — H40013 Open angle with borderline findings, low risk, bilateral: Secondary | ICD-10-CM | POA: Diagnosis not present

## 2016-04-13 ENCOUNTER — Encounter: Payer: Self-pay | Admitting: Internal Medicine

## 2016-05-06 DIAGNOSIS — H16143 Punctate keratitis, bilateral: Secondary | ICD-10-CM | POA: Diagnosis not present

## 2016-05-06 DIAGNOSIS — H18413 Arcus senilis, bilateral: Secondary | ICD-10-CM | POA: Diagnosis not present

## 2016-05-06 DIAGNOSIS — H2513 Age-related nuclear cataract, bilateral: Secondary | ICD-10-CM | POA: Diagnosis not present

## 2016-08-07 ENCOUNTER — Other Ambulatory Visit: Payer: Self-pay

## 2016-08-07 ENCOUNTER — Ambulatory Visit (INDEPENDENT_AMBULATORY_CARE_PROVIDER_SITE_OTHER): Payer: Medicare HMO | Admitting: Internal Medicine

## 2016-08-07 ENCOUNTER — Encounter: Payer: Self-pay | Admitting: Internal Medicine

## 2016-08-07 VITALS — BP 160/76 | HR 60 | Temp 98.0°F | Wt 147.0 lb

## 2016-08-07 DIAGNOSIS — I499 Cardiac arrhythmia, unspecified: Secondary | ICD-10-CM | POA: Diagnosis not present

## 2016-08-07 DIAGNOSIS — R21 Rash and other nonspecific skin eruption: Secondary | ICD-10-CM

## 2016-08-07 DIAGNOSIS — R079 Chest pain, unspecified: Secondary | ICD-10-CM | POA: Diagnosis not present

## 2016-08-07 DIAGNOSIS — F4329 Adjustment disorder with other symptoms: Secondary | ICD-10-CM

## 2016-08-07 DIAGNOSIS — R42 Dizziness and giddiness: Secondary | ICD-10-CM

## 2016-08-07 DIAGNOSIS — W57XXXA Bitten or stung by nonvenomous insect and other nonvenomous arthropods, initial encounter: Secondary | ICD-10-CM

## 2016-08-07 MED ORDER — LORAZEPAM 0.5 MG PO TABS: 0.5000 mg | ORAL_TABLET | Freq: Two times a day (BID) | ORAL | 0 refills | Status: DC | PRN

## 2016-08-07 MED ORDER — MIRTAZAPINE 15 MG PO TABS: 15.0000 mg | ORAL_TABLET | Freq: Every evening | ORAL | 1 refills | Status: DC | PRN

## 2016-08-07 NOTE — Progress Notes (Signed)
No chief complaint on file.   HPI: Alisha Brown 76 y.o.  sda   Rash a hives since resolved and now having stress and c/o  of chest pain  Feels like eys muscle contractions and hard to see .   May last  30 seconds and then 5 minutes  Get upset and then stays with her and hard to sleep  And  Gets chest pain and not lets go and then gets dizzy spells.    And cant drive.   Up to 3 days .   Rash this am   . Like hives they have since resolved. She did have a bug bite onset a day or 2 ago that stung when she was in the garden and itched. It is about the same. She didn't see what bit her.   Husband wants  Her to come in for a while because she isn't feeling well she is super stressed with family stress issues.  Other dizzy  Skips beats .   Has been seen by Dr. Lovena Le about 4 years ago just felt that she would be fine and unless she is having syncope did not need an intervention. She had a Holter tests and monitors about 20 years ago but nothing recently she hasn't had any cardiovascular assessment other than primary care. She attributes her symptoms to stress but gets frequent premature beats that are worse under stress and does get some dizziness. Her blood pressures have been up in the 150 range but then she checked and it was 120. Is not on antihypertensive medicines but is being given either beta blockers or calcium channel blockers in the past. Requests a small amount of Xanax type medicine. Thinks it might help a little. She has a remote history seeing counselors but not recently but this was under stress times.  hx of  Break down and  antidep  When had a break down. She is very cautious and fearful of a number of medicines and getting side effects.  She states she is overdue for physical exam and lab monitoring. ROS: See pertinent positives and negatives per HPI.  Past Medical History:  Diagnosis Date  . Allergy   . Anemia   . Anxiety   . Arrhythmia    heart  . CHF (congestive heart  failure) ([place])   . Diverticulosis of colon (without mention of hemorrhage)   . GERD with stricture   . GXQJJHER(740.8)    consult dr Tomma Rakers visual migraine?  agg by HRT?  [name] Heart murmur   . Hiatal hernia   . Hyperlipidemia   . Hypertension   . Mitral valve disorder   . PVC (premature ventricular contraction)   . Seizures ([place])     Family History  Problem Relation Age of Onset  . Diabetes Mother   . Glaucoma Mother   . Diabetes Son   . Diabetes Sister        1/2 sister-same mom  . Breast cancer Maternal Grandmother   . Hypertension Brother   . Colon cancer Neg Hx     Social History   Social History  . Marital status: Married    Spouse name: N/A  . Number of children: 1  . Years of education: N/A   Occupational History  . retired    Social History Main Topics  . Smoking status: Never Smoker  . Smokeless tobacco: Never Used  . Alcohol use No     Comment: rarely  . Drug use: No  .  Sexual activity: Not Asked   Other Topics Concern  . None   Social History Narrative   HHof 2 dog    Just  Died   [name]    Retired and married   [name]      Dog with health issues  Cooks for him bladder stones    Outpatient Medications Prior to Visit  Medication Sig Dispense Refill  . Flaxseed, Linseed, (FLAXSEED OIL PO) Take by mouth. Takes 1- 2 daily.    [name] MAGNESIUM PO Take 1 tablet by mouth daily.     . Methylsulfonylmethane (MSM PO) Take by mouth daily.    . Multiple Vitamin (MULTIVITAMIN) tablet Take 1 tablet by mouth daily.      . mupirocin ointment (BACTROBAN) 2 % Apply 1 application topically 3 (three) times daily. 22 g 0  . Ranitidine HCl (RANITIDINE 75 PO) Take 1 tablet by mouth 2 (two) times daily.     [name] amoxicillin-clavulanate (AUGMENTIN) 875-125 MG tablet Take 1 tablet by mouth 2 (two) times daily. Take with food 14 tablet 0   No facility-administered medications prior to visit.      EXAM:  BP (!) 160/76 (BP Location: Right Arm, Patient Position: Sitting, Cuff Size:  Normal)   Pulse 60   Temp 98 F (36.7 C) (Oral)   Wt 147 lb (66.7 kg)   BMI 24.46 kg/m   Body mass index is 24.46 kg/m.  GENERAL: vitals reviewed and listed above, alert, oriented, appears well hydrated and in no acute distressShe looks quite tired and stressed. HEENT: atraumatic, conjunctiva  clear, no obvious abnormalities on inspection of external nose and ears OP : no lesion edema or exudate  NECK: no obvious masses on inspection palpation  LUNGS: clear to auscultation bilaterally, no wheezes, rales or rhonchi, good air movement CV: frequent premature beats., no clubbing cyanosis or  peripheral edema nl cap refill  MS: moves all extremities without noticeable focal  Abnormality Skin on right lower extremity shows a patch of about 4-5 cm of red induration that is nontender in there is a central punctate lesion. No bull's-eye. PSYCH: pleasant and cooperative, apepars stressed  But nl thought and speech  BP Readings from Last 3 Encounters:  08/07/16 (!) 160/76  01/28/16 134/76  10/21/15 (!) 146/70   Wt Readings from Last 3 Encounters:  08/07/16 147 lb (66.7 kg)  01/28/16 143 lb (64.9 kg)  10/21/15 143 lb 14.4 oz (65.3 kg)   Lab Results  Component Value Date   WBC 5.8 02/02/2014   HGB 13.5 02/02/2014   HCT 41.5 02/02/2014   PLT 229.0 02/02/2014   GLUCOSE 94 02/02/2014   CHOL 241 (H) 02/02/2014   TRIG 61.0 02/02/2014   HDL 64.50 02/02/2014   LDLDIRECT 169.1 05/30/2012   LDLCALC 164 (H) 02/02/2014   ALT 29 02/02/2014   AST 25 02/02/2014   NA 141 02/02/2014   K 4.8 02/02/2014   CL 104 02/02/2014   CREATININE 0.8 02/02/2014   BUN 24 (H) 02/02/2014   CO2 29 02/02/2014   TSH 1.62 02/02/2014   INR 0.9 RATIO 03/29/2007   HGBA1C 6.3 02/02/2014   EKG shows very frequent PVCs no other acute changes.   ASSESSMENT AND PLAN:  Discussed the following assessment and plan:  Chest pain, unspecified type - Plan: EKG 12-Lead  Stress and adjustment reaction  Rash - resolved  prob hives with hx of same  Dizziness  Cardiac arrhythmia, unspecified cardiac arrhythmia type - Frequent PVCs history nonsustained V. tach.  Not felt to be significant. She has recurrent dizzy spells not always related to anxiety no monitoring for  over 15?  Insect bite, initial encounter Consideration  Of adding  bp medications.  Continues when necessary benzo try to get sleep with Remeron we discussed this a bit I don't think it's a high risk medicine. Better than Benadryl. Is not helpful we'll do something else. Follow-up in a month we can do it as his CPX exam and follow-up on these issues she should bring her blood pressure monitor in the next visit. -Patient advised to return or notify health care team  if symptoms worsen ,persist or new concerns arise. The difficulty challenge  is that she has cardiovascular risk factors but also anxiety will not discount her Last card eval 2005?  Total visit 40 mins > 50% spent counseling and coordinating care as indicated in above note and in instructions to patient .    Patient Instructions  Take blood pressure readings twice a day for 7- 10 days and then periodically .       You may benefit from blood pressure medication .  Some types of bp meds  May also  Helps the sx of palpitations   Such as ow dose alpha  beta blockers .   Anxiety and stress can cause some sx.   Alprazolam /loreazepam can be used as rescue  But is not for long term use for stress and anxiety sleep.  Can try  remeron  Generic at night to see if helps some with sleep  7.5- 15 mg  ( there are higher doses  )  This is not   an dependent medication   And   May be safer than the tylenol pm.     Plan fu  In 1 month or as needed   I think the bug bite will get better on its own and the rash may be hives  And not allergic reaction  EKG no change from past    Premature beats PVCs and  No other changes     Stress and Stress Management Stress is a normal reaction to life events.  It is what you feel when life demands more than you are used to or more than you can handle. Some stress can be useful. For example, the stress reaction can help you catch the last bus of the day, study for a test, or meet a deadline at work. But stress that occurs too often or for too long can cause problems. It can affect your emotional health and interfere with relationships and normal daily activities. Too much stress can weaken your immune system and increase your risk for physical illness. If you already have a medical problem, stress can make it worse. What are the causes? All sorts of life events may cause stress. An event that causes stress for one person may not be stressful for another person. Major life events commonly cause stress. These may be positive or negative. Examples include losing your job, moving into a new home, getting married, having a baby, or losing a loved one. Less obvious life events may also cause stress, especially if they occur day after day or in combination. Examples include working long hours, driving in traffic, caring for children, being in debt, or being in a difficult relationship. What are the signs or symptoms? Stress may cause emotional symptoms including, the following:  Anxiety. This is feeling worried, afraid, on edge, overwhelmed, or out of control.  Anger. This is feeling irritated or impatient.  Depression. This is feeling sad, down, helpless, or guilty.  Difficulty focusing, remembering, or making decisions.  Stress may cause physical symptoms, including the following:  Aches and pains. These may affect your head, neck, back, stomach, or other areas of your body.  Tight muscles or clenched jaw.  Low energy or trouble sleeping.  Stress may cause unhealthy behaviors, including the following:  Eating to feel better (overeating) or skipping meals.  Sleeping too little, too much, or both.  Working too much or putting off tasks  (procrastination).  Smoking, drinking alcohol, or using drugs to feel better.  How is this diagnosed? Stress is diagnosed through an assessment by your health care provider. Your health care provider will ask questions about your symptoms and any stressful life events.Your health care provider will also ask about your medical history and may order blood tests or other tests. Certain medical conditions and medicine can cause physical symptoms similar to stress. Mental illness can cause emotional symptoms and unhealthy behaviors similar to stress. Your health care provider may refer you to a mental health professional for further evaluation. How is this treated? Stress management is the recommended treatment for stress.The goals of stress management are reducing stressful life events and coping with stress in healthy ways. Techniques for reducing stressful life events include the following:  Stress identification. Self-monitor for stress and identify what causes stress for you. These skills may help you to avoid some stressful events.  Time management. Set your priorities, keep a calendar of events, and learn to say "no." These tools can help you avoid making too many commitments.  Techniques for coping with stress include the following:  Rethinking the problem. Try to think realistically about stressful events rather than ignoring them or overreacting. Try to find the positives in a stressful situation rather than focusing on the negatives.  Exercise. Physical exercise can release both physical and emotional tension. The key is to find a form of exercise you enjoy and do it regularly.  Relaxation techniques. These relax the body and mind. Examples include yoga, meditation, tai chi, biofeedback, deep breathing, progressive muscle relaxation, listening to music, being out in nature, journaling, and other hobbies. Again, the key is to find one or more that you enjoy and can do regularly.  Healthy  lifestyle. Eat a balanced diet, get plenty of sleep, and do not smoke. Avoid using alcohol or drugs to relax.  Strong support network. Spend time with family, friends, or other people you enjoy being around.Express your feelings and talk things over with someone you trust.  Counseling or talktherapy with a mental health professional may be helpful if you are having difficulty managing stress on your own. Medicine is typically not recommended for the treatment of stress.Talk to your health care provider if you think you need medicine for symptoms of stress. Follow these instructions at home:  Keep all follow-up visits as directed by your health care provider.  Take all medicines as directed by your health care provider. Contact a health care provider if:  Your symptoms get worse or you start having new symptoms.  You feel overwhelmed by your problems and can no longer manage them on your own. Get help right away if:  You feel like hurting yourself or someone else. This information is not intended to replace advice given to you by your health care provider. Make sure you discuss any questions you have with your health care provider. Document Released:  07/08/2000 Document Revised: 06/20/2015 Document Reviewed: 09/06/2012 Elsevier Interactive Patient Education  2017 [place]. Ceniyah Thorp M.D.   SUMMARY - Overall left ventricular systolic function was mildly to    moderately decreased. Left ventricular ejection fraction was    estimated , range being 40 % to 45 %. There was mild diffuse    left ventricular hypokinesis. - There was mild aortic valvular regurgitation. - There was mild mitral valvular regurgitation. - The left atrium was mildly dilated. - The pulmonary veins were grossly normal. - LVEF hard to quantify due to irregular rhythm.  IMPRESSIONS - LVEF hard to quantify due to irregular  rhythm.  ---------------------------------------------------------------  Prepared and Electronically Authenticated by  Pierre Bali MD  Confirmed 22-Mar-2007 19:45:33   Cards 2005 holter rate sinus up to 160 and pvcs when on ekg machine did not seem to correlate

## 2016-08-07 NOTE — Patient Instructions (Addendum)
Take blood pressure readings twice a day for 7- 10 days and then periodically .       You may benefit from blood pressure medication .  Some types of bp meds  May also  Helps the sx of palpitations   Such as ow dose alpha  beta blockers .   Anxiety and stress can cause some sx.   Alprazolam /loreazepam can be used as rescue  But is not for long term use for stress and anxiety sleep.  Can try  remeron  Generic at night to see if helps some with sleep  7.5- 15 mg  ( there are higher doses  )  This is not   an dependent medication   And   May be safer than the tylenol pm.     Plan fu  In 1 month or as needed   I think the bug bite will get better on its own and the rash may be hives  And not allergic reaction  EKG no change from past    Premature beats PVCs and  No other changes     Stress and Stress Management Stress is a normal reaction to life events. It is what you feel when life demands more than you are used to or more than you can handle. Some stress can be useful. For example, the stress reaction can help you catch the last bus of the day, study for a test, or meet a deadline at work. But stress that occurs too often or for too long can cause problems. It can affect your emotional health and interfere with relationships and normal daily activities. Too much stress can weaken your immune system and increase your risk for physical illness. If you already have a medical problem, stress can make it worse. What are the causes? All sorts of life events may cause stress. An event that causes stress for one person may not be stressful for another person. Major life events commonly cause stress. These may be positive or negative. Examples include losing your job, moving into a new home, getting married, having a baby, or losing a loved one. Less obvious life events may also cause stress, especially if they occur day after day or in combination. Examples include working long hours, driving in  traffic, caring for children, being in debt, or being in a difficult relationship. What are the signs or symptoms? Stress may cause emotional symptoms including, the following:  Anxiety. This is feeling worried, afraid, on edge, overwhelmed, or out of control.  Anger. This is feeling irritated or impatient.  Depression. This is feeling sad, down, helpless, or guilty.  Difficulty focusing, remembering, or making decisions.  Stress may cause physical symptoms, including the following:  Aches and pains. These may affect your head, neck, back, stomach, or other areas of your body.  Tight muscles or clenched jaw.  Low energy or trouble sleeping.  Stress may cause unhealthy behaviors, including the following:  Eating to feel better (overeating) or skipping meals.  Sleeping too little, too much, or both.  Working too much or putting off tasks (procrastination).  Smoking, drinking alcohol, or using drugs to feel better.  How is this diagnosed? Stress is diagnosed through an assessment by your health care provider. Your health care provider will ask questions about your symptoms and any stressful life events.Your health care provider will also ask about your medical history and may order blood tests or other tests. Certain medical conditions and medicine can cause  physical symptoms similar to stress. Mental illness can cause emotional symptoms and unhealthy behaviors similar to stress. Your health care provider may refer you to a mental health professional for further evaluation. How is this treated? Stress management is the recommended treatment for stress.The goals of stress management are reducing stressful life events and coping with stress in healthy ways. Techniques for reducing stressful life events include the following:  Stress identification. Self-monitor for stress and identify what causes stress for you. These skills may help you to avoid some stressful events.  Time  management. Set your priorities, keep a calendar of events, and learn to say "no." These tools can help you avoid making too many commitments.  Techniques for coping with stress include the following:  Rethinking the problem. Try to think realistically about stressful events rather than ignoring them or overreacting. Try to find the positives in a stressful situation rather than focusing on the negatives.  Exercise. Physical exercise can release both physical and emotional tension. The key is to find a form of exercise you enjoy and do it regularly.  Relaxation techniques. These relax the body and mind. Examples include yoga, meditation, tai chi, biofeedback, deep breathing, progressive muscle relaxation, listening to music, being out in nature, journaling, and other hobbies. Again, the key is to find one or more that you enjoy and can do regularly.  Healthy lifestyle. Eat a balanced diet, get plenty of sleep, and do not smoke. Avoid using alcohol or drugs to relax.  Strong support network. Spend time with family, friends, or other people you enjoy being around.Express your feelings and talk things over with someone you trust.  Counseling or talktherapy with a mental health professional may be helpful if you are having difficulty managing stress on your own. Medicine is typically not recommended for the treatment of stress.Talk to your health care provider if you think you need medicine for symptoms of stress. Follow these instructions at home:  Keep all follow-up visits as directed by your health care provider.  Take all medicines as directed by your health care provider. Contact a health care provider if:  Your symptoms get worse or you start having new symptoms.  You feel overwhelmed by your problems and can no longer manage them on your own. Get help right away if:  You feel like hurting yourself or someone else. This information is not intended to replace advice given to you by  your health care provider. Make sure you discuss any questions you have with your health care provider. Document Released: 07/08/2000 Document Revised: 06/20/2015 Document Reviewed: 09/06/2012 Elsevier Interactive Patient Education  2017 [employer].

## 2016-08-25 ENCOUNTER — Encounter: Payer: Self-pay | Admitting: Internal Medicine

## 2016-09-17 NOTE — Progress Notes (Signed)
Chief Complaint  Patient presents with  . Annual Exam    HPI: Alisha Brown 76 y.o. comes in today for Preventive Medicare exam/ wellness visitand fu other problems   .Since last visit. Has been monitoring her blood pressure and pulse in his noted when she has her dizzy spells her blood pressure is low and her pulse goes up there in the 92-100 range. The rest of the time it's mostly at goal  She brings in her monitor today to check it again starts.   Today she feels sick change in her bladder symptoms tenderness dysuria and back pain. Over and above  Baseline  Says bp only goes high when she is sick  Her sleep is not good anyway Wonders if the ranitidine is causing the rash she wakes up with it in the morning not particularly itchy and then goes awayand goes.    Always has  irreg heart beat and told no big deal in the past  Says he stress level is not better and thus  Sx not from that at this time.     Health Maintenance  Topic Date Due  . INFLUENZA VACCINE  08/26/2016  . MAMMOGRAM  01/15/2018  . TETANUS/TDAP  09/24/2021  . COLONOSCOPY  01/24/2022  . DEXA SCAN  Completed  . PNA vac Low Risk Adult  Completed   Health Maintenance Review LIFESTYLE:  Exercise:   Tobacco/ETS:n Alcohol: n Sugar beverages:avoids Sleep: interrupted 4-5 hours  Some tahy some bladder sx  Drug use: no HH:2 + dog      Hearing: ok  Vision:  No limitations at present . Last eye check UTD  Safety:  Has smoke detector and wears seat belts.  No firearms. No excess sun exposure. Sees dentist regularly.  Falls: n  Memory: Felt to be good  , no concern from her or her family.  Depression: No anhedonia unusual crying or depressive symptoms  Nutrition: Eats well balanced diet; adequate calcium and vitamin D. No swallowing chewing problems.  Injury: no major injuries in the last six months.  Other healthcare providers:  Reviewed today .  Social:  Lives with spouse married. dog  Preventive  parameters: up-to-date  Reviewed   ADLS:   There are no problems or need for assistance  driving, feeding, obtaining food, dressing, toileting and bathing, managing money using phone. She is independent.   ROS:  GEN/ HEENT: No fever, significant weight changes sweats headaches vision problems hearing changes, CV/ PULM; No chest pain shortness of breath cough, syncope,edema  change in exercise tolerance. GI /GU: No adominal pain, vomiting, change in bowel habits. No blood in the stool. No significant GU symptoms. SKIN/HEME: ,no acute skin rashes suspicious lesions or bleeding. No lymphadenopathy, nodules, masses.  NEURO/ PSYCH:  No neurologic signs such as weakness numbness. No depression anxiety. IMM/ Allergy: No unusual infections.  Allergy .   REST of 12 system review negative except as per HPI   Past Medical History:  Diagnosis Date  . Allergy   . Anemia   . Anxiety   . Arrhythmia    heart  . CHF (congestive heart failure) ([place])   . Diverticulosis of colon (without mention of hemorrhage)   . GERD with stricture   . LHTDSKAJ(681.1)    consult dr Tomma Rakers visual migraine?  agg by HRT?  [name] Heart murmur   . Hiatal hernia   . Hyperlipidemia   . Hypertension   . Mitral valve disorder   . PVC (premature ventricular  contraction)   . Seizures ([place])     Family History  Problem Relation Age of Onset  . Diabetes Mother   . Glaucoma Mother   . Diabetes Son   . Diabetes Sister        1/2 sister-same mom  . Breast cancer Maternal Grandmother   . Hypertension Brother   . Colon cancer Neg Hx     Social History   Social History  . Marital status: Married    Spouse name: N/A  . Number of children: 1  . Years of education: N/A   Occupational History  . retired    Social History Main Topics  . Smoking status: Never Smoker  . Smokeless tobacco: Never Used  . Alcohol use No  . Drug use: No  . Sexual activity: Not Asked   Other Topics Concern  . None   Social History  Narrative   HHof 2 dog    Just  Died   [name]    Retired and married   [name]      Dog with health issues  Cooks for him bladder stones      EXAM:  BP (!) 150/100 (BP Location: Left Arm, Patient Position: Sitting, Cuff Size: Normal)   Pulse 86   Temp 98 F (36.7 C) (Oral)   Ht 5' 4.25" (1.632 m)   Wt 147 lb 14.4 oz (67.1 kg)   BMI 25.19 kg/m   Body mass index is 25.19 kg/m.  Physical Exam: Vital signs reviewed FOY:DXAJ is a well-developed well-nourished alert cooperative   who appears stated age in no acute distress.  Looks sleep deproved and exhauseted but nl speech and not anxious  HEENT: normocephalic atraumatic , Eyes: PERRL EOM's full, conjunctiva clear, Nares: paten,t no deformity discharge or tenderness., Ears: no deformity EAC's clear TMs with normal landmarks. Mouth: clear OP, no lesions, edema.  Moist mucous membranes. Dentition in adequate repair. NECK: supple without masses, thyromegaly or bruits. CHEST/PULM:  Clear to auscultation and percussion breath sounds equal no wheeze , rales or rhonchi. No chest wall deformities or tenderness.Breast: normal by inspection . No dimpling, discharge, masses, tenderness or discharge . CV: PMI is nondisplaced, S1 S2 no gallops, murmurs, rubs.very irreg HB  premature beats rate about 70  Peripheral pulses are full without delay.No JVD .  ABDOMEN: Bowel sounds normal nontender  No guard or rebound, no hepato splenomegal no CVA tenderness.   Extremtities:  No clubbing cyanosis or edema, no acute joint swelling or redness no focal atrophy NEURO:  Oriented x3, cranial nerves 3-12 appear to be intact, no obvious focal weakness,gait within normal limits no abnormal reflexes or asymmetrical SKIN: No acute rashes normal turgor, color, no bruising or petechiae. PSYCH: Oriented, good eye contact, no obvious depression anxiety, cognition and judgment appear normal. LN: no cervical axillary  adenopathy No noted deficits in memory, attention, and  speech.   Lab Results  Component Value Date   WBC 9.9 09/18/2016   HGB 14.3 09/18/2016   HCT 44.1 09/18/2016   PLT 240.0 09/18/2016   GLUCOSE 103 (H) 09/18/2016   CHOL 255 (H) 09/18/2016   TRIG 84.0 09/18/2016   HDL 70.70 09/18/2016   LDLDIRECT 169.1 05/30/2012   LDLCALC 168 (H) 09/18/2016   ALT 19 09/18/2016   AST 19 09/18/2016   NA 141 09/18/2016   K 4.3 09/18/2016   CL 102 09/18/2016   CREATININE 0.99 09/18/2016   BUN 18 09/18/2016   CO2 32 09/18/2016   TSH 1.25  09/18/2016   INR 0.9 RATIO 03/29/2007   HGBA1C 6.3 02/02/2014    ASSESSMENT AND PLAN:  Discussed the following assessment and plan:  Visit for preventive health examination - Plan: Basic metabolic panel, Hepatic function panel, CBC with Differential/Platelet, TSH, T4, free, Lipid panel, POCT Urinalysis Dipstick (Automated), Urine Culture  Dysuria - uti acute on chornic sx  cx and rx - Plan: Basic metabolic panel, Hepatic function panel, CBC with Differential/Platelet, TSH, T4, free, Lipid panel, POCT Urinalysis Dipstick (Automated), Urine Culture  Dizziness - Plan: Basic metabolic panel, Hepatic function panel, CBC with Differential/Platelet, TSH, T4, free, Lipid panel, POCT Urinalysis Dipstick (Automated), Urine Culture, Ambulatory referral to Cardiology, ECHOCARDIOGRAM COMPLETE  Hypotension, unspecified hypotension type - episodic   bp up and down  monitor correleates with office readingsa - Plan: Basic metabolic panel, Hepatic function panel, CBC with Differential/Platelet, TSH, T4, free, Lipid panel, POCT Urinalysis Dipstick (Automated), Urine Culture, Ambulatory referral to Cardiology, ECHOCARDIOGRAM COMPLETE  Hyperlipidemia, unspecified hyperlipidemia type - Plan: Basic metabolic panel, Hepatic function panel, CBC with Differential/Platelet, TSH, T4, free, Lipid panel, POCT Urinalysis Dipstick (Automated), Urine Culture  Cardiac arrhythmia, unspecified cardiac arrhythmia type - frequent  ventricular ectopy    on prev ekgs  couplets unifocal last [id number]? some hypokinesis ? from arrythmia  - Plan: Ambulatory referral to Cardiology, ECHOCARDIOGRAM COMPLETE She is having episodic dizziness and has documented lower blood pressures higher pulse rate in the 100s at that time although irregularity is unclear. On exam today she has frequent irregular beats that apparently her baseline and has been told in the past that they are nothing to worry about. I however suspect they  could be symptomatic. She was of the understanding that  The irreg HB is ongoing and not to worry about  But these may have progressed    After patient left  More time in record review  no recent   Event monitor or echo etc  r blood pressure seems to be up only "when she is sick" and she probably has a UTI today. Plan urology consult  lab tests today getting back with cardiology. Will do a referral as  She hasn't been seen since 2014?  Consider getting echo also.  She has no rash today would be curious that ranitidine would do that  She is not taking  daily meds otherwise  Patient Care Team: Tyler Robidoux, Standley Brooking, MD as PCP - General Evans Lance, MD (Cardiology) Jari Pigg, MD (Dermatology) DUNN  for eye exams      Patient Instructions  Checking for urine infection today Advise we get urology consult at some point. Treatment we see. Checking lab tests today. Glad to bright your blood pressure machine in with recordings. After lab tests done will make a decision consider getting back to cardiology about your episodic dizziness and low blood pressure.  Plan fu depending on labs    Can consider getting the shingles vaccine when you're feeling better. Preventive Care 23 Years and Older, Female Preventive care refers to lifestyle choices and visits with your health care provider that can promote health and wellness. What does preventive care include?  A yearly physical exam. This is also called an annual well check.  Dental exams  once or twice a year.  Routine eye exams. Ask your health care provider how often you should have your eyes checked.  Personal lifestyle choices, including: ? Daily care of your teeth and gums. ? Regular physical activity. ? Eating a healthy diet. ? Avoiding  tobacco and drug use. ? Limiting alcohol use. ? Practicing safe sex. ? Taking low-dose aspirin every day. ? Taking vitamin and mineral supplements as recommended by your health care provider. What happens during an annual well check? The services and screenings done by your health care provider during your annual well check will depend on your age, overall health, lifestyle risk factors, and family history of disease. Counseling Your health care provider may ask you questions about your:  Alcohol use.  Tobacco use.  Drug use.  Emotional well-being.  Home and relationship well-being.  Sexual activity.  Eating habits.  History of falls.  Memory and ability to understand (cognition).  Work and work Statistician.  Reproductive health.  Screening You may have the following tests or measurements:  Height, weight, and BMI.  Blood pressure.  Lipid and cholesterol levels. These may be checked every 5 years, or more frequently if you are over 45 years old.  Skin check.  Lung cancer screening. You may have this screening every year starting at age 91 if you have a 30-pack-year history of smoking and currently smoke or have quit within the past 15 years.  Fecal occult blood test (FOBT) of the stool. You may have this test every year starting at age 54.  Flexible sigmoidoscopy or colonoscopy. You may have a sigmoidoscopy every 5 years or a colonoscopy every 10 years starting at age 37.  Hepatitis C blood test.  Hepatitis B blood test.  Sexually transmitted disease (STD) testing.  Diabetes screening. This is done by checking your blood sugar (glucose) after you have not eaten for a while (fasting). You may have  this done every 1-3 years.  Bone density scan. This is done to screen for osteoporosis. You may have this done starting at age 24.  Mammogram. This may be done every 1-2 years. Talk to your health care provider about how often you should have regular mammograms.  Talk with your health care provider about your test results, treatment options, and if necessary, the need for more tests. Vaccines Your health care provider may recommend certain vaccines, such as:  Influenza vaccine. This is recommended every year.  Tetanus, diphtheria, and acellular pertussis (Tdap, Td) vaccine. You may need a Td booster every 10 years.  Varicella vaccine. You may need this if you have not been vaccinated.  Zoster vaccine. You may need this after age 75.  Measles, mumps, and rubella (MMR) vaccine. You may need at least one dose of MMR if you were born in 1957 or later. You may also need a second dose.  Pneumococcal 13-valent conjugate (PCV13) vaccine. One dose is recommended after age 62.  Pneumococcal polysaccharide (PPSV23) vaccine. One dose is recommended after age 51.  Meningococcal vaccine. You may need this if you have certain conditions.  Hepatitis A vaccine. You may need this if you have certain conditions or if you travel or work in places where you may be exposed to hepatitis A.  Hepatitis B vaccine. You may need this if you have certain conditions or if you travel or work in places where you may be exposed to hepatitis B.  Haemophilus influenzae type b (Hib) vaccine. You may need this if you have certain conditions.  Talk to your health care provider about which screenings and vaccines you need and how often you need them. This information is not intended to replace advice given to you by your health care provider. Make sure you discuss any questions you have with your health  care provider. Document Released: 02/08/2015 Document Revised: 10/02/2015 Document Reviewed: 11/13/2014 Elsevier  Interactive Patient Education  2017 Laurel K. Cohl Behrens M.D.

## 2016-09-18 ENCOUNTER — Ambulatory Visit (INDEPENDENT_AMBULATORY_CARE_PROVIDER_SITE_OTHER): Payer: Medicare HMO | Admitting: Internal Medicine

## 2016-09-18 ENCOUNTER — Encounter: Payer: Self-pay | Admitting: Internal Medicine

## 2016-09-18 VITALS — BP 150/100 | HR 86 | Temp 98.0°F | Ht 64.25 in | Wt 147.9 lb

## 2016-09-18 DIAGNOSIS — I499 Cardiac arrhythmia, unspecified: Secondary | ICD-10-CM | POA: Diagnosis not present

## 2016-09-18 DIAGNOSIS — I959 Hypotension, unspecified: Secondary | ICD-10-CM

## 2016-09-18 DIAGNOSIS — R300 Dysuria: Secondary | ICD-10-CM | POA: Diagnosis not present

## 2016-09-18 DIAGNOSIS — E785 Hyperlipidemia, unspecified: Secondary | ICD-10-CM | POA: Diagnosis not present

## 2016-09-18 DIAGNOSIS — R42 Dizziness and giddiness: Secondary | ICD-10-CM | POA: Diagnosis not present

## 2016-09-18 DIAGNOSIS — Z0000 Encounter for general adult medical examination without abnormal findings: Secondary | ICD-10-CM | POA: Diagnosis not present

## 2016-09-18 LAB — POC URINALSYSI DIPSTICK (AUTOMATED)
Bilirubin, UA: NEGATIVE
Clarity, UA: NEGATIVE
GLUCOSE UA: NEGATIVE
Ketones, UA: NEGATIVE
NITRITE UA: NEGATIVE
PROTEIN UA: NEGATIVE
Spec Grav, UA: 1.01 (ref 1.010–1.025)
Urobilinogen, UA: 0.2 E.U./dL
pH, UA: 7 (ref 5.0–8.0)

## 2016-09-18 LAB — CBC WITH DIFFERENTIAL/PLATELET
BASOS ABS: 0 10*3/uL (ref 0.0–0.1)
Basophils Relative: 0.2 % (ref 0.0–3.0)
EOS ABS: 0.1 10*3/uL (ref 0.0–0.7)
Eosinophils Relative: 1.1 % (ref 0.0–5.0)
HEMATOCRIT: 44.1 % (ref 36.0–46.0)
Hemoglobin: 14.3 g/dL (ref 12.0–15.0)
LYMPHS PCT: 25.6 % (ref 12.0–46.0)
Lymphs Abs: 2.5 10*3/uL (ref 0.7–4.0)
MCHC: 32.5 g/dL (ref 30.0–36.0)
MCV: 92 fl (ref 78.0–100.0)
Monocytes Absolute: 0.6 10*3/uL (ref 0.1–1.0)
Monocytes Relative: 6.1 % (ref 3.0–12.0)
Neutro Abs: 6.6 10*3/uL (ref 1.4–7.7)
Neutrophils Relative %: 67 % (ref 43.0–77.0)
Platelets: 240 10*3/uL (ref 150.0–400.0)
RBC: 4.79 Mil/uL (ref 3.87–5.11)
RDW: 14 % (ref 11.5–15.5)
WBC: 9.9 10*3/uL (ref 4.0–10.5)

## 2016-09-18 LAB — LIPID PANEL
CHOLESTEROL: 255 mg/dL — AB (ref 0–200)
HDL: 70.7 mg/dL (ref >39.00)
LDL Cholesterol: 168 mg/dL — ABNORMAL HIGH (ref 0–99)
NonHDL: 184.78
Total CHOL/HDL Ratio: 4
Triglycerides: 84 mg/dL (ref 0.0–149.0)
VLDL: 16.8 mg/dL (ref 0.0–40.0)

## 2016-09-18 LAB — BASIC METABOLIC PANEL
BUN: 18 mg/dL (ref 6–23)
CHLORIDE: 102 meq/L (ref 96–112)
CO2: 32 meq/L (ref 19–32)
CREATININE: 0.99 mg/dL (ref 0.40–1.20)
Calcium: 9.8 mg/dL (ref 8.4–10.5)
GFR: 58.29 mL/min — ABNORMAL LOW (ref >60.00)
Glucose, Bld: 103 mg/dL — ABNORMAL HIGH (ref 70–99)
Potassium: 4.3 mEq/L (ref 3.5–5.1)
Sodium: 141 mEq/L (ref 135–145)

## 2016-09-18 LAB — HEPATIC FUNCTION PANEL
ALT: 19 U/L (ref 0–35)
AST: 19 U/L (ref 0–37)
Albumin: 4.7 g/dL (ref 3.5–5.2)
Alkaline Phosphatase: 78 U/L (ref 39–117)
Bilirubin, Direct: 0.1 mg/dL (ref 0.0–0.3)
TOTAL PROTEIN: 7.1 g/dL (ref 6.0–8.3)
Total Bilirubin: 0.6 mg/dL (ref 0.2–1.2)

## 2016-09-18 LAB — T4, FREE: FREE T4: 0.93 ng/dL (ref 0.60–1.60)

## 2016-09-18 LAB — TSH: TSH: 1.25 u[IU]/mL (ref 0.35–4.50)

## 2016-09-18 MED ORDER — ZOSTER VAC RECOMB ADJUVANTED 50 MCG/0.5ML IM SUSR: 0.5000 mL | Freq: Once | INTRAMUSCULAR | 1 refills | Status: AC

## 2016-09-18 MED ORDER — NITROFURANTOIN MONOHYD MACRO 100 MG PO CAPS: 100.0000 mg | ORAL_CAPSULE | Freq: Two times a day (BID) | ORAL | 0 refills | Status: AC

## 2016-09-18 NOTE — Patient Instructions (Addendum)
Checking for urine infection today Advise we get urology consult at some point. Treatment we see. Checking lab tests today. Glad to bright your blood pressure machine in with recordings. After lab tests done will make a decision consider getting back to cardiology about your episodic dizziness and low blood pressure.  Plan fu depending on labs    Can consider getting the shingles vaccine when you're feeling better. Preventive Care 74 Years and Older, Female Preventive care refers to lifestyle choices and visits with your health care provider that can promote health and wellness. What does preventive care include?  A yearly physical exam. This is also called an annual well check.  Dental exams once or twice a year.  Routine eye exams. Ask your health care provider how often you should have your eyes checked.  Personal lifestyle choices, including: ? Daily care of your teeth and gums. ? Regular physical activity. ? Eating a healthy diet. ? Avoiding tobacco and drug use. ? Limiting alcohol use. ? Practicing safe sex. ? Taking low-dose aspirin every day. ? Taking vitamin and mineral supplements as recommended by your health care provider. What happens during an annual well check? The services and screenings done by your health care provider during your annual well check will depend on your age, overall health, lifestyle risk factors, and family history of disease. Counseling Your health care provider may ask you questions about your:  Alcohol use.  Tobacco use.  Drug use.  Emotional well-being.  Home and relationship well-being.  Sexual activity.  Eating habits.  History of falls.  Memory and ability to understand (cognition).  Work and work Statistician.  Reproductive health.  Screening You may have the following tests or measurements:  Height, weight, and BMI.  Blood pressure.  Lipid and cholesterol levels. These may be checked every 5 years, or more  frequently if you are over 22 years old.  Skin check.  Lung cancer screening. You may have this screening every year starting at age 23 if you have a 30-pack-year history of smoking and currently smoke or have quit within the past 15 years.  Fecal occult blood test (FOBT) of the stool. You may have this test every year starting at age 33.  Flexible sigmoidoscopy or colonoscopy. You may have a sigmoidoscopy every 5 years or a colonoscopy every 10 years starting at age 36.  Hepatitis C blood test.  Hepatitis B blood test.  Sexually transmitted disease (STD) testing.  Diabetes screening. This is done by checking your blood sugar (glucose) after you have not eaten for a while (fasting). You may have this done every 1-3 years.  Bone density scan. This is done to screen for osteoporosis. You may have this done starting at age 15.  Mammogram. This may be done every 1-2 years. Talk to your health care provider about how often you should have regular mammograms.  Talk with your health care provider about your test results, treatment options, and if necessary, the need for more tests. Vaccines Your health care provider may recommend certain vaccines, such as:  Influenza vaccine. This is recommended every year.  Tetanus, diphtheria, and acellular pertussis (Tdap, Td) vaccine. You may need a Td booster every 10 years.  Varicella vaccine. You may need this if you have not been vaccinated.  Zoster vaccine. You may need this after age 30.  Measles, mumps, and rubella (MMR) vaccine. You may need at least one dose of MMR if you were born in 1957 or later. You may  also need a second dose.  Pneumococcal 13-valent conjugate (PCV13) vaccine. One dose is recommended after age 65.  Pneumococcal polysaccharide (PPSV23) vaccine. One dose is recommended after age 89.  Meningococcal vaccine. You may need this if you have certain conditions.  Hepatitis A vaccine. You may need this if you have certain  conditions or if you travel or work in places where you may be exposed to hepatitis A.  Hepatitis B vaccine. You may need this if you have certain conditions or if you travel or work in places where you may be exposed to hepatitis B.  Haemophilus influenzae type b (Hib) vaccine. You may need this if you have certain conditions.  Talk to your health care provider about which screenings and vaccines you need and how often you need them. This information is not intended to replace advice given to you by your health care provider. Make sure you discuss any questions you have with your health care provider. Document Released: 02/08/2015 Document Revised: 10/02/2015 Document Reviewed: 11/13/2014 Elsevier Interactive Patient Education  2017 [employer].

## 2016-09-21 LAB — URINE CULTURE

## 2016-09-22 ENCOUNTER — Other Ambulatory Visit: Payer: Self-pay | Admitting: Emergency Medicine

## 2016-09-22 DIAGNOSIS — R300 Dysuria: Secondary | ICD-10-CM

## 2016-09-22 DIAGNOSIS — Z87448 Personal history of other diseases of urinary system: Secondary | ICD-10-CM

## 2016-09-23 ENCOUNTER — Ambulatory Visit (HOSPITAL_COMMUNITY): Payer: Medicare HMO | Attending: Cardiology

## 2016-09-23 ENCOUNTER — Other Ambulatory Visit: Payer: Self-pay

## 2016-09-23 DIAGNOSIS — I340 Nonrheumatic mitral (valve) insufficiency: Secondary | ICD-10-CM | POA: Insufficient documentation

## 2016-09-23 DIAGNOSIS — E785 Hyperlipidemia, unspecified: Secondary | ICD-10-CM | POA: Insufficient documentation

## 2016-09-23 DIAGNOSIS — I509 Heart failure, unspecified: Secondary | ICD-10-CM | POA: Insufficient documentation

## 2016-09-23 DIAGNOSIS — I499 Cardiac arrhythmia, unspecified: Secondary | ICD-10-CM | POA: Diagnosis not present

## 2016-09-23 DIAGNOSIS — I493 Ventricular premature depolarization: Secondary | ICD-10-CM | POA: Insufficient documentation

## 2016-09-23 DIAGNOSIS — I959 Hypotension, unspecified: Secondary | ICD-10-CM | POA: Diagnosis not present

## 2016-09-23 DIAGNOSIS — I110 Hypertensive heart disease with heart failure: Secondary | ICD-10-CM | POA: Diagnosis not present

## 2016-09-23 DIAGNOSIS — R42 Dizziness and giddiness: Secondary | ICD-10-CM | POA: Diagnosis not present

## 2016-09-29 ENCOUNTER — Encounter: Payer: Self-pay | Admitting: Internal Medicine

## 2016-10-16 ENCOUNTER — Ambulatory Visit (INDEPENDENT_AMBULATORY_CARE_PROVIDER_SITE_OTHER): Payer: Medicare HMO | Admitting: Internal Medicine

## 2016-10-16 ENCOUNTER — Encounter: Payer: Self-pay | Admitting: Internal Medicine

## 2016-10-16 VITALS — BP 150/92 | HR 88 | Ht 64.25 in | Wt 149.2 lb

## 2016-10-16 DIAGNOSIS — I493 Ventricular premature depolarization: Secondary | ICD-10-CM | POA: Diagnosis not present

## 2016-10-16 DIAGNOSIS — I472 Ventricular tachycardia: Secondary | ICD-10-CM | POA: Diagnosis not present

## 2016-10-16 DIAGNOSIS — I4729 Other ventricular tachycardia: Secondary | ICD-10-CM

## 2016-10-16 DIAGNOSIS — R079 Chest pain, unspecified: Secondary | ICD-10-CM

## 2016-10-16 DIAGNOSIS — I10 Essential (primary) hypertension: Secondary | ICD-10-CM | POA: Diagnosis not present

## 2016-10-16 DIAGNOSIS — I429 Cardiomyopathy, unspecified: Secondary | ICD-10-CM

## 2016-10-16 MED ORDER — CARVEDILOL 3.125 MG PO TABS: 3.1250 mg | ORAL_TABLET | Freq: Two times a day (BID) | ORAL | 1 refills | Status: DC

## 2016-10-16 NOTE — Progress Notes (Signed)
New Outpatient Visit Date: 10/16/2016  Referring Provider: Burnis Medin, MD [place], [place] [address]  Chief Complaint: Chest pain and dizziness  HPI:  Ms. Renbarger is a 76 y.o. female who is being seen today for the evaluation of chest pain at the request of Dr. Regis Bill. She has a history of cardiomyopathy, hypertension, and hyperlipidemia. Ms. Riedesel reports episodic dizziness over the last year. She often feels as though her vision becomes blurry and her eyes deviate to one side. She then feels off balance and sometimes event believes that she may pass out. She experienced one such episode while driving but was able to pull off the road without difficulty. She has therefore been avoiding driving. She denies accompanying palpitations. The episodes typically last 5 minutes or less and arise without any clear precipitants. As part of workup of her symptoms, Ms. Strough recently underwent echo, which showed moderately reduced LVEF (40-45%), similar to prior study many years ago.  Ms. Szatkowski reports almost constant chest pain, which has been present for years. She describes the pain as a sharp, substernal discomfort that is not exertional. At times it even feels like an animal is gnawing inside her chest. She has undergone prior ischemia evaluation, including cardiac catheterization in 2009 that revealed clean coronary arteries.  Ms. Marcus was last seen in our office in 2014 by Dr. Lovena Le. At the time, Ms. Mehler was advised to restart metoprolol. However, she reports a rash after taking generic metoprolol that prompted her to stop the medication (she had use brand-name Lopressor in the past without any problems).  --------------------------------------------------------------------------------------------------  Cardiovascular History & Procedures: Cardiovascular Problems:  Cardiomyopathy  PVCs and nonsustained ventricular tachycardia  Risk  Factors:  Hypertension, hyperlipidemia, and age > 50  Cath/PCI:  LHC (03/30/07): LMCA normal. LAD normal. LCx normal. Dominant RCA normal. LVEF 50%.   CV Surgery:  None  EP Procedures and Devices:  None available  Non-Invasive Evaluation(s):  TTE (09/23/16): Normal LV size with LVEF 40-45% and diffuse hypokinesis. Grade 1 diastolic dysfunction. Trivial AI. Mild MR. Mild left atrial enlargement.  Recent CV Pertinent Labs: Lab Results  Component Value Date   CHOL 255 (H) 09/18/2016   HDL 70.70 09/18/2016   LDLCALC 168 (H) 09/18/2016   LDLDIRECT 169.1 05/30/2012   TRIG 84.0 09/18/2016   CHOLHDL 4 09/18/2016   INR 0.9 RATIO 03/29/2007   K 4.3 09/18/2016   MG 2.2 05/30/2012   BUN 18 09/18/2016   CREATININE 0.99 09/18/2016    --------------------------------------------------------------------------------------------------  Past Medical History:  Diagnosis Date  . Allergy   . Anemia   . Anxiety   . Arrhythmia    heart  . CHF (congestive heart failure) ([place])   . Diverticulosis of colon (without mention of hemorrhage)   . GERD with stricture   . QIONGEXB(284.1)    consult dr Tomma Rakers visual migraine?  agg by HRT?  [name] Heart murmur   . Hiatal hernia   . Hyperlipidemia   . Hypertension   . Mitral valve disorder   . PVC (premature ventricular contraction)   . Seizures ([place])     Past Surgical History:  Procedure Laterality Date  . ABDOMINAL HYSTERECTOMY  1987  . ADENOIDECTOMY    . APPENDECTOMY  1987  . BREAST SURGERY Right 1990   biopsy,benign  . COLONOSCOPY    . ESOPHAGOGASTRODUODENOSCOPY    . FOOT SURGERY Right 2011  . TONSILLECTOMY  1953  . TUBAL LIGATION      Current  Meds  Medication Sig  . Flaxseed, Linseed, (FLAXSEED OIL PO) Take by mouth. Takes 1- 2 daily.  [name] LORazepam (ATIVAN) 0.5 MG tablet Take 1 tablet (0.5 mg total) by mouth 2 (two) times daily as needed for anxiety.  [name] MAGNESIUM PO Take 1 tablet by mouth daily.   . Methylsulfonylmethane (MSM PO)  Take by mouth daily.  . mirtazapine (REMERON) 15 MG tablet Take 1 tablet (15 mg total) by mouth at bedtime as needed.  . Multiple Vitamin (MULTIVITAMIN) tablet Take 1 tablet by mouth daily.    . Ranitidine HCl (RANITIDINE 75 PO) Take 1 tablet by mouth 2 (two) times daily.     Allergies: Aleve [naproxen sodium]; Augmentin [amoxicillin-pot clavulanate]; Codeine; Gabapentin; Krill oil; and Propoxyphene n-acetaminophen  Social History   Social History  . Marital status: Married    Spouse name: N/A  . Number of children: 1  . Years of education: N/A   Occupational History  . retired    Social History Main Topics  . Smoking status: Never Smoker  . Smokeless tobacco: Never Used  . Alcohol use No  . Drug use: No  . Sexual activity: Not on file   Other Topics Concern  . Not on file   Social History Narrative   HHof 2 dog    Just  Died   [name]    Retired and married   [name]      Dog with health issues  Cooks for him bladder stones    Family History  Problem Relation Age of Onset  . Diabetes Mother   . Glaucoma Mother   . Diabetes Son   . Diabetes Sister        1/2 sister-same mom  . Breast cancer Maternal Grandmother   . Hypertension Brother   . Colon cancer Neg Hx     Review of Systems: Review of Systems  Constitutional: Negative.   HENT: Positive for tinnitus (hears heartbeat in her ears).   Eyes: Positive for blurred vision.  Respiratory: Negative.   Cardiovascular: Positive for chest pain. Negative for palpitations, orthopnea, claudication and leg swelling.  Gastrointestinal: Negative.   Genitourinary: Negative.   Musculoskeletal: Negative.   Skin: Positive for rash (Wakes up with hives every morning; they disappear spontaneously by 3 PM.).  Neurological: Positive for dizziness. Negative for focal weakness.  Endo/Heme/Allergies: Negative.   Psychiatric/Behavioral: Negative.     --------------------------------------------------------------------------------------------------  Physical Exam: BP (!) 150/92   Pulse 88   Ht 5' 4.25" (1.632 m)   Wt 149 lb 3.2 oz (67.7 kg)   SpO2 98%   BMI 25.41 kg/m   General:  Well-developed woman, seated comfortably in the exam room. She is accompanied by her husband. HEENT: No conjunctival pallor or scleral icterus. Moist mucous membranes. OP clear. Neck: Supple without lymphadenopathy, thyromegaly, JVD, or HJR. No carotid bruit. Lungs: Normal work of breathing. Clear to auscultation bilaterally without wheezes or crackles. Heart: Irregular without murmurs, rubs, or gallops. Non-displaced PMI. Abd: Bowel sounds present. Soft, NT/ND without hepatosplenomegaly Ext: No lower extremity edema. Radial, PT, and DP pulses are 2+ bilaterally Skin: Warm and dry without rash. Neuro: CNIII-XII intact. Strength and fine-touch sensation intact in upper and lower extremities bilaterally. Psych: Normal mood and affect.  EKG:  Normal sinus rhythm with frequent PVCs and inferolateral T-wave inversions and nonspecific T-wave changes. Rhythm strip shows brief episode of NSVT.  Lab Results  Component Value Date   WBC 9.9 09/18/2016   HGB 14.3 09/18/2016   HCT  44.1 09/18/2016   MCV 92.0 09/18/2016   PLT 240.0 09/18/2016    Lab Results  Component Value Date   NA 141 09/18/2016   K 4.3 09/18/2016   CL 102 09/18/2016   CO2 32 09/18/2016   BUN 18 09/18/2016   CREATININE 0.99 09/18/2016   GLUCOSE 103 (H) 09/18/2016   ALT 19 09/18/2016    Lab Results  Component Value Date   CHOL 255 (H) 09/18/2016   HDL 70.70 09/18/2016   LDLCALC 168 (H) 09/18/2016   LDLDIRECT 169.1 05/30/2012   TRIG 84.0 09/18/2016   CHOLHDL 4 09/18/2016    --------------------------------------------------------------------------------------------------  ASSESSMENT AND PLAN: Cardiomyopathy Ms. Apgar appears euvolemic and well-compensated today with NYHA  class II symptoms. 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 We will start carvedilol 3.125 mg BID. We will consider uptitration as well as addition of ACEI/ARB when she returns for follow-up.  Chest pain This has been a chronic problem and is atypical. However, in light of EKG abnormalities and cardiomyopathy, I think it would be prudent to evaluate for interval development of CAD (LHC in 2009 was clean). We have agreed to proceed with a pharmacologic myocardial perfusion stress test. Pending this test, I have instructed Ms. Daffern to start taking ASA 81 mg daily.  PVCs and NSVT Long-standing problem for the patient, for which she previously saw Dr. Lovena Le (most recently in 2014). We will start carvedilol 3.125 mg BID and refer Ms. Shellhammer back to Dr. Lovena Le for further management.  Hypertension BP is suboptimally controlled. We will add low-dose carvedilol, as above, and discuss uptitration and/or addition of ACEI/ARB when she returns for follow-up.  Follow-up: Return to clinic in 3 months.  Nelva Bush, MD 10/16/2016 3:55 PM

## 2016-10-16 NOTE — Patient Instructions (Signed)
Medication Instructions:  Start aspirin 81 mg daily. Observe for allergic reaction similar to when you took naproxen.  Start coreg (carvedilol) 3.125 mg two times a day.  Labwork: None   Testing/Procedures: Your physician has requested that you have a lexiscan myoview. For further information please visit [email or web address]. Please follow instruction sheet, as given.   Follow-Up: Your physician recommends that you schedule a follow-up appointment in: 3 months with Dr End.  You have been referred to Dr Cristopher Peru for evaluation of presyncope and PVCs        If you need a refill on your cardiac medications before your next appointment, please call your pharmacy.

## 2016-10-17 ENCOUNTER — Encounter: Payer: Self-pay | Admitting: Internal Medicine

## 2016-10-17 DIAGNOSIS — I493 Ventricular premature depolarization: Secondary | ICD-10-CM | POA: Insufficient documentation

## 2016-10-17 DIAGNOSIS — I472 Ventricular tachycardia: Secondary | ICD-10-CM | POA: Insufficient documentation

## 2016-10-17 DIAGNOSIS — I4729 Other ventricular tachycardia: Secondary | ICD-10-CM | POA: Insufficient documentation

## 2016-10-17 DIAGNOSIS — R079 Chest pain, unspecified: Secondary | ICD-10-CM | POA: Insufficient documentation

## 2016-10-19 ENCOUNTER — Telehealth (HOSPITAL_COMMUNITY): Payer: Self-pay | Admitting: *Deleted

## 2016-10-19 NOTE — Telephone Encounter (Signed)
Patient given detailed instructions per Myocardial Perfusion Study Information Sheet for the test on 10/22/16 at 0945. Patient notified to arrive 15 minutes early and that it is imperative to arrive on time for appointment to keep from having the test rescheduled.  If you need to cancel or reschedule your appointment, please call the office within 24 hours of your appointment. . Patient verbalized understanding.Janeshia Ciliberto, Ranae Palms

## 2016-10-22 ENCOUNTER — Ambulatory Visit (HOSPITAL_COMMUNITY): Payer: Medicare HMO | Attending: Cardiology

## 2016-10-22 DIAGNOSIS — I10 Essential (primary) hypertension: Secondary | ICD-10-CM | POA: Insufficient documentation

## 2016-10-22 DIAGNOSIS — R42 Dizziness and giddiness: Secondary | ICD-10-CM | POA: Insufficient documentation

## 2016-10-22 DIAGNOSIS — I493 Ventricular premature depolarization: Secondary | ICD-10-CM

## 2016-10-22 DIAGNOSIS — I429 Cardiomyopathy, unspecified: Secondary | ICD-10-CM | POA: Diagnosis not present

## 2016-10-22 DIAGNOSIS — R079 Chest pain, unspecified: Secondary | ICD-10-CM

## 2016-10-22 DIAGNOSIS — R0609 Other forms of dyspnea: Secondary | ICD-10-CM | POA: Diagnosis not present

## 2016-10-22 DIAGNOSIS — R9439 Abnormal result of other cardiovascular function study: Secondary | ICD-10-CM | POA: Insufficient documentation

## 2016-10-22 LAB — MYOCARDIAL PERFUSION IMAGING
CHL CUP NUCLEAR SDS: 4
CHL CUP NUCLEAR SRS: 1
CHL CUP NUCLEAR SSS: 5
CSEPPBP: 15788 mmHg
CSEPPHR: 101 {beats}/min
RATE: 0.33
Rest HR: 74 {beats}/min
TID: 0.89

## 2016-10-22 MED ORDER — TECHNETIUM TC 99M TETROFOSMIN IV KIT
10.5000 | PACK | Freq: Once | INTRAVENOUS | Status: AC | PRN
  Administered 2016-10-22: 10.5 via INTRAVENOUS
  Filled 2016-10-22: qty 11

## 2016-10-22 MED ORDER — REGADENOSON 0.4 MG/5ML IV SOLN
0.4000 mg | Freq: Once | INTRAVENOUS | Status: AC
  Administered 2016-10-22: 0.4 mg via INTRAVENOUS

## 2016-10-22 MED ORDER — TECHNETIUM TC 99M TETROFOSMIN IV KIT
32.5000 | PACK | Freq: Once | INTRAVENOUS | Status: AC | PRN
  Administered 2016-10-22: 32.5 via INTRAVENOUS
  Filled 2016-10-22: qty 33

## 2016-10-23 ENCOUNTER — Telehealth: Payer: Self-pay | Admitting: Internal Medicine

## 2016-10-23 NOTE — Telephone Encounter (Signed)
I spoke with Ms. Goodridge regarding results of her stress test. This was low risk but showed at least 2 areas of possible scar; no ischemia was noted. She continues to have chronic chest pain and some dizziness. Overall, she has noted some improvement, though, with addition of carvedilol. She has not started taking ASA but will begin this today. She is scheduled to see Dr. Lovena Le next month, as well as a urologist. She notes recurrent UTI's and thinks that she may be developing dysuria again.  We discussed further treatment options, including monitoring, coronary CTA, and cardiac catheterization. Given that she remains symptomatic and has evidence of possible prior infarcts on her stress test, I have recommended cardiac catheterization. However, I think it is important to evaluate her recurrent UTI's first. We will plan to see her in the office in mid/late October to readdress her symptoms and the need for catheterization.  Nelva Bush, MD [facility] Pager: [phone].

## 2016-11-03 ENCOUNTER — Encounter: Payer: Self-pay | Admitting: Internal Medicine

## 2016-11-03 ENCOUNTER — Ambulatory Visit (INDEPENDENT_AMBULATORY_CARE_PROVIDER_SITE_OTHER): Payer: Medicare HMO | Admitting: Internal Medicine

## 2016-11-03 VITALS — BP 140/66 | HR 71 | Ht 65.0 in | Wt 148.8 lb

## 2016-11-03 DIAGNOSIS — I493 Ventricular premature depolarization: Secondary | ICD-10-CM | POA: Diagnosis not present

## 2016-11-03 DIAGNOSIS — R002 Palpitations: Secondary | ICD-10-CM

## 2016-11-03 NOTE — Patient Instructions (Addendum)
Medication Instructions:  Your physician recommends that you continue on your current medications as directed. Please refer to the Current Medication list given to you today.  Labwork: None ordered.  Testing/Procedures: Your physician has recommended that you wear a holter monitor. Holter monitors are medical devices that record the heart's electrical activity. Doctors most often use these monitors to diagnose arrhythmias. Arrhythmias are problems with the speed or rhythm of the heartbeat. The monitor is a small, portable device. You can wear one while you do your normal daily activities. This is usually used to diagnose what is causing palpitations/syncope (passing out).  Please schedule patient for a 48 holter monitor.    Follow-Up:  Your physician recommends that you schedule a follow-up appointment in: 3-4 weeks after patient wears holter monitor with Dr. Lovena Le.    Any Other Special Instructions Will Be Listed Below (If Applicable).  If you need a refill on your cardiac medications before your next appointment, please call your pharmacy.

## 2016-11-03 NOTE — Progress Notes (Signed)
HPI Alisha Brown returns today, referred by Dr. Saunders Revel for ongoing evaluation and management of her palpitations, near syncope, dizziness, and chest pressure. She had seen Dr. Saunders Revel several weeks ago and was placed on beta blocker therapy. I saw the patient approximately 4-1/2 years ago. At that time, she had palpitations known PVCs and left ventricular dysfunction with minimal if any heart failure symptoms. I had recommended that she undergo an initiation of beta blocker therapy. She took this for a while but then stopped it. She was prescribed carvedilol as noted above and feels better. She still has periods where she feels fatigue and malaise without any clear-cut palpitations. The patient has atypical chest pain, and underwent stress testing which was considered low risk. She wants to know whether or not she should undergo cardiac catheterization. At times her chest pain is exertional. At other times it is atypical. Allergies  Allergen Reactions  . Aleve [Naproxen Sodium] Hives  . Augmentin [Amoxicillin-Pot Clavulanate] Diarrhea and Nausea And Vomiting  . Codeine   . Gabapentin   . Krill Oil Hives  . Propoxyphene N-Acetaminophen     REACTION: hives,dizzy,gi upset     Current Outpatient Prescriptions  Medication Sig Dispense Refill  . aspirin EC 81 MG tablet Take 1 tablet (81 mg total) by mouth daily.    . carvedilol (COREG) 3.125 MG tablet Take 1 tablet (3.125 mg total) by mouth 2 (two) times daily. 180 tablet 1  . Flaxseed, Linseed, (FLAXSEED OIL PO) Take by mouth. Takes 1- 2 daily.    [name] MAGNESIUM PO Take 1 tablet by mouth daily.     . Methylsulfonylmethane (MSM PO) Take by mouth daily.    . Multiple Vitamin (MULTIVITAMIN) tablet Take 1 tablet by mouth daily.      . Ranitidine HCl (RANITIDINE 75 PO) Take 1 tablet by mouth 2 (two) times daily.     [name] LORazepam (ATIVAN) 0.5 MG tablet Take 1 tablet (0.5 mg total) by mouth 2 (two) times daily as needed for anxiety. (Patient not taking:  Reported on 11/03/2016) 24 tablet 0  . mirtazapine (REMERON) 15 MG tablet Take 1 tablet (15 mg total) by mouth at bedtime as needed. (Patient not taking: Reported on 10/16/2016) 30 tablet 1   No current facility-administered medications for this visit.      Past Medical History:  Diagnosis Date  . Allergy   . Anemia   . Anxiety   . Arrhythmia    heart  . CHF (congestive heart failure) ([place])   . Diverticulosis of colon (without mention of hemorrhage)   . GERD with stricture   . VQMGQQPY(195.0)    consult dr Tomma Rakers visual migraine?  agg by HRT?  [name] Heart murmur   . Hiatal hernia   . Hyperlipidemia   . Hypertension   . Mitral valve disorder   . PVC (premature ventricular contraction)   . Seizures ([place])     ROS:   All systems reviewed and negative except as noted in the HPI.   Past Surgical History:  Procedure Laterality Date  . ABDOMINAL HYSTERECTOMY  1987  . ADENOIDECTOMY    . APPENDECTOMY  1987  . BREAST SURGERY Right 1990   biopsy,benign  . CARDIAC CATHETERIZATION  2009   No CAD. LVEF 50%.  . COLONOSCOPY    . ESOPHAGOGASTRODUODENOSCOPY    . FOOT SURGERY Right 2011  . TONSILLECTOMY  1953  . TUBAL LIGATION       Family History  Problem Relation  Age of Onset  . Diabetes Mother   . Glaucoma Mother   . Diabetes Son   . Diabetes Sister        1/2 sister-same mom  . Heart murmur Sister   . Arrhythmia Sister   . Breast cancer Maternal Grandmother   . Hypertension Brother   . Heart murmur Brother   . Arrhythmia Brother   . Diabetes Brother   . Colon cancer Neg Hx      Social History   Social History  . Marital status: Married    Spouse name: N/A  . Number of children: 1  . Years of education: N/A   Occupational History  . retired    Social History Main Topics  . Smoking status: Never Smoker  . Smokeless tobacco: Never Used  . Alcohol use No  . Drug use: No  . Sexual activity: Not on file   Other Topics Concern  . Not on file   Social History  Narrative   HHof 2 dog    Just  Died   [name]    Retired and married   [name]      Dog with health issues  Cooks for him bladder stones     BP 140/66   Pulse 71   Ht 5\' 5"  (1.651 m)   Wt 148 lb 12.8 oz (67.5 kg)   SpO2 97%   BMI 24.76 kg/m   Physical Exam:  Well appearing79 year old woman, NAD HEENT: Unremarkable Neck: 6 cm  JVD, no thyromegally Lymphatics:  No adenopathy Back:  No CVA tenderness Lungs:  Clear, with no wheezes, rales, or rhonchi HEART:  Regular rate rhythm, no murmurs, no rubs, no clicks Abd:  soft, positive bowel sounds, no organomegally, no rebound, no guarding Ext:  2 plus pulses, no edema, no cyanosis, no clubbing Skin:  No rashes no nodules Neuro:  CN II through XII intact, motor grossly intact  EKG - reviewed  Assess/Plan: 1. PVCs - the real question is whether or not she is having an excessive number of PVCs in 24 hours and whether or not she is having any other arrhythmias. I recommended that she undergo a 48 hour Holter monitor. Additional recommendations will be based on the results of her cardiac monitor. 2. Left ventricular dysfunction- by echo her EF has not changed. The question is why did she have left ventricular dysfunction. She has been whether or not she should undergo cardiac catheterization. I have deferred to her primary cardiologist on this matter. 3. Hypertension - her systolic blood pressure is slightly elevated. She is encouraged to maintain a low-sodium diet. I will not change her medications at this time, although up titration of her Coreg would be an option in the future.

## 2016-11-05 DIAGNOSIS — N3000 Acute cystitis without hematuria: Secondary | ICD-10-CM | POA: Diagnosis not present

## 2016-11-06 DIAGNOSIS — N3000 Acute cystitis without hematuria: Secondary | ICD-10-CM | POA: Diagnosis not present

## 2016-11-13 ENCOUNTER — Ambulatory Visit: Payer: Medicare HMO | Admitting: Internal Medicine

## 2016-11-17 ENCOUNTER — Ambulatory Visit (INDEPENDENT_AMBULATORY_CARE_PROVIDER_SITE_OTHER): Payer: Medicare HMO

## 2016-11-17 DIAGNOSIS — I493 Ventricular premature depolarization: Secondary | ICD-10-CM | POA: Diagnosis not present

## 2016-11-17 DIAGNOSIS — R002 Palpitations: Secondary | ICD-10-CM | POA: Diagnosis not present

## 2016-11-17 DIAGNOSIS — R079 Chest pain, unspecified: Secondary | ICD-10-CM | POA: Diagnosis not present

## 2016-11-17 DIAGNOSIS — N3000 Acute cystitis without hematuria: Secondary | ICD-10-CM | POA: Diagnosis not present

## 2016-11-17 DIAGNOSIS — R42 Dizziness and giddiness: Secondary | ICD-10-CM | POA: Diagnosis not present

## 2016-11-17 DIAGNOSIS — R55 Syncope and collapse: Secondary | ICD-10-CM | POA: Diagnosis not present

## 2016-12-02 ENCOUNTER — Other Ambulatory Visit: Payer: Self-pay | Admitting: Internal Medicine

## 2016-12-02 DIAGNOSIS — Z1231 Encounter for screening mammogram for malignant neoplasm of breast: Secondary | ICD-10-CM

## 2016-12-08 ENCOUNTER — Encounter: Payer: Self-pay | Admitting: Internal Medicine

## 2016-12-08 ENCOUNTER — Ambulatory Visit: Payer: Medicare HMO | Admitting: Internal Medicine

## 2016-12-08 ENCOUNTER — Ambulatory Visit: Payer: Medicare HMO

## 2016-12-08 ENCOUNTER — Telehealth: Payer: Self-pay

## 2016-12-08 VITALS — BP 126/76 | HR 76 | Ht 65.0 in | Wt 149.0 lb

## 2016-12-08 DIAGNOSIS — I493 Ventricular premature depolarization: Secondary | ICD-10-CM | POA: Diagnosis not present

## 2016-12-08 DIAGNOSIS — I519 Heart disease, unspecified: Secondary | ICD-10-CM | POA: Diagnosis not present

## 2016-12-08 DIAGNOSIS — I200 Unstable angina: Secondary | ICD-10-CM | POA: Diagnosis not present

## 2016-12-08 NOTE — Telephone Encounter (Signed)
Message sent to Dr. Saunders Revel.  Notified Dr. Saunders Revel that Pt had been scheduled for a r/l heart cath on Thursday.  Per Pt, she had completed a 10 day course of Cipro and follow up urinalysis had been negative for infection.  However because Pt continued to have slight discomfort she will continue on macrodantin 50 mg daily q HS for 6 months.  Asked Dr. Saunders Revel if OK for Pt to have cath on Thursday and does she need to hold the antibiotic prior to procedure? -Per Dr. Saunders Revel- Madaline Brilliant for cath on Thursday and Pt does not need to hold antibiotic.  -Call placed to Pt.  Notified per Dr. Saunders Revel ok for cath on Thursday and she does not need to hold antibiotic.  Pt indicates understanding.   Will call tomorrow with results of labs drawn today.

## 2016-12-08 NOTE — H&P (View-Only) (Signed)
HPI Alisha Brown returns today for followup of chest pain, palpitations, and VT/PVCs. She feels very poorly. She has had over 20,000 PVC's in 24 hours on holter monitor. The patient has not had syncope. She has chest pain with both typical and atypical features for coronary ischemia. She has LV dysfunction with an EF of 40%. She has been on medical therapy. Allergies  Allergen Reactions  . Aleve [Naproxen Sodium] Hives  . Augmentin [Amoxicillin-Pot Clavulanate] Diarrhea and Nausea And Vomiting  . Codeine   . Gabapentin   . Krill Oil Hives  . Propoxyphene N-Acetaminophen     REACTION: hives,dizzy,gi upset     Current Outpatient Medications  Medication Sig Dispense Refill  . aspirin EC 81 MG tablet Take 1 tablet (81 mg total) by mouth daily.    . carvedilol (COREG) 3.125 MG tablet Take 1 tablet (3.125 mg total) by mouth 2 (two) times daily. 180 tablet 1  . Flaxseed, Linseed, (FLAXSEED OIL PO) Take by mouth. Takes 1- 2 daily.    [name] LORazepam (ATIVAN) 0.5 MG tablet Take 1 tablet (0.5 mg total) by mouth 2 (two) times daily as needed for anxiety. (Patient not taking: Reported on 11/03/2016) 24 tablet 0  . MAGNESIUM PO Take 1 tablet by mouth daily.     . Methylsulfonylmethane (MSM PO) Take by mouth daily.    . mirtazapine (REMERON) 15 MG tablet Take 1 tablet (15 mg total) by mouth at bedtime as needed. (Patient not taking: Reported on 10/16/2016) 30 tablet 1  . Multiple Vitamin (MULTIVITAMIN) tablet Take 1 tablet by mouth daily.      . Ranitidine HCl (RANITIDINE 75 PO) Take 1 tablet by mouth 2 (two) times daily.     . Vitamins A & D (VITAMIN A & D) 10000-400 units TABS Take 1 tablet every other day by mouth.     No current facility-administered medications for this visit.      Past Medical History:  Diagnosis Date  . Allergy   . Anemia   . Anxiety   . Arrhythmia    heart  . CHF (congestive heart failure) ([place])   . Diverticulosis of colon (without mention of hemorrhage)   .  GERD with stricture   . WSFKCLEX(517.0)    consult dr Tomma Rakers visual migraine?  agg by HRT?  [name] Heart murmur   . Hiatal hernia   . Hyperlipidemia   . Hypertension   . Mitral valve disorder   . PVC (premature ventricular contraction)   . Seizures ([place])     ROS:   All systems reviewed and negative except as noted in the HPI.   Past Surgical History:  Procedure Laterality Date  . ABDOMINAL HYSTERECTOMY  1987  . ADENOIDECTOMY    . APPENDECTOMY  1987  . BREAST SURGERY Right 1990   biopsy,benign  . CARDIAC CATHETERIZATION  2009   No CAD. LVEF 50%.  . COLONOSCOPY    . ESOPHAGOGASTRODUODENOSCOPY    . FOOT SURGERY Right 2011  . TONSILLECTOMY  1953  . TUBAL LIGATION       Family History  Problem Relation Age of Onset  . Diabetes Mother   . Glaucoma Mother   . Diabetes Son   . Diabetes Sister        1/2 sister-same mom  . Heart murmur Sister   . Arrhythmia Sister   . Breast cancer Maternal Grandmother   . Hypertension Brother   . Heart murmur Brother   . Arrhythmia Brother   .  Diabetes Brother   . Colon cancer Neg Hx      Social History   Socioeconomic History  . Marital status: Married    Spouse name: Not on file  . Number of children: 1  . Years of education: Not on file  . Highest education level: Not on file  Social Needs  . Financial resource strain: Not on file  . Food insecurity - worry: Not on file  . Food insecurity - inability: Not on file  . Transportation needs - medical: Not on file  . Transportation needs - non-medical: Not on file  Occupational History  . Occupation: retired  Tobacco Use  . Smoking status: Never Smoker  . Smokeless tobacco: Never Used  Substance and Sexual Activity  . Alcohol use: No  . Drug use: No  . Sexual activity: Not on file  Other Topics Concern  . Not on file  Social History Narrative   HHof 2 dog    Just  Died   Alisha Brown    Retired and married   [name]      Dog with health issues  Cooks for him bladder stones      BP 126/76   Pulse 76   Ht 5\' 5"  (1.651 m)   Wt 149 lb (67.6 kg)   SpO2 98%   BMI 24.79 kg/m   Physical Exam:  stable appearing 76 yo woman, NAD HEENT: Unremarkable Neck:  6 cm JVD, no thyromegally Lymphatics:  No adenopathy Back:  No CVA tenderness Lungs:  Clear  With no wheezes HEART:  IRegular rate rhythm, no murmurs, no rubs, no clicks Abd:  soft, positive bowel sounds, no organomegally, no rebound, no guarding Ext:  2 plus pulses, no edema, no cyanosis, no clubbing Skin:  No rashes no nodules Neuro:  CN II through XII intact, motor grossly intact   DEVICE  Normal device function.  See PaceArt for details.   Assess/Plan: 1. PVC's - she appears to be symptomatic. We will decide on which AA drug to use based on the results of her heart cath. 2. Chronic systolic heart failure - her symptoms are class 2. She will continue her beta blocker and start an ACE/ARB once she has had her heart cath 3.chest pain - her symptoms are both typical and atypical. With her LV dysfunction, she will undergo cardiac cath.  Mikle Bosworth.D.

## 2016-12-08 NOTE — Patient Instructions (Addendum)
Medication Instructions:  Your physician recommends that you continue on your current medications as directed. Please refer to the Current Medication list given to you today.  Labwork: You will get CBC, BMP and PT/INR today.    Testing/Procedures: Your physician has requested that you have a cardiac catheterization. Cardiac catheterization is used to diagnose and/or treat various heart conditions. Doctors may recommend this procedure for a number of different reasons. The most common reason is to evaluate chest pain. Chest pain can be a symptom of coronary artery disease (CAD), and cardiac catheterization can show whether plaque is narrowing or blocking your heart's arteries. This procedure is also used to evaluate the valves, as well as measure the blood flow and oxygen levels in different parts of your heart. For further information please visit [email or web address]. Please follow instruction sheet, as given.    [facility] OFF[facility] [address] [place] [address] [place] [address] Dept: [phone] Loc: [phone]  Rianne Degraaf  12/08/2016  You are scheduled for a Cardiac Catheterization on Thursday, November 15 with Dr. Harrell Gave End.  1. Please arrive at the [facility] (Main Entrance A) at [facility]: [place], [place] [address] at 11:30 AM (two hours before your procedure to ensure your preparation). Free valet parking service is available.   Special note: Every effort is made to have your procedure done on time. Please understand that emergencies sometimes delay scheduled procedures.  2. Diet: You may have a full breakfast, but nothing after 8 AM on your procedure day.  3. Labs: You will need to have blood drawn on Tuesday, November 13 at [facility] at [facility]. [address]  Open: 7:30am - 5pm    Phone: [phone]. You do not need to be  fasting.  4. Medication instructions in preparation for your procedure:  On the morning of your procedure, take your aspirin 81 mg, carvedilol and your anxiety medication.  You may use sips of water.  5. Plan for one night stay--bring personal belongings. 6. Bring a current list of your medications and current insurance cards. 7. You MUST have a responsible person to drive you home. 8. Someone MUST be with you the first 24 hours after you arrive home or your discharge will be delayed. 9. Please wear clothes that are easy to get on and off and wear slip-on shoes.  Thank you for allowing [place] to care for you!   -- [facility]   Follow-Up: You will follow up with Dr. Lovena Le based on results of the cardiac catheterization.  Any Other Special Instructions Will Be Listed Below (If Applicable).     If you need a refill on your cardiac medications before your next appointment, please call your pharmacy.

## 2016-12-08 NOTE — Progress Notes (Signed)
HPI [name] returns today for followup of chest pain, palpitations, and VT/PVCs. She feels very poorly. She has had over 20,000 PVC's in 24 hours on holter monitor. The patient has not had syncope. She has chest pain with both typical and atypical features for coronary ischemia. She has LV dysfunction with an EF of 40%. She has been on medical therapy. Allergies  Allergen Reactions  . Aleve [Naproxen Sodium] Hives  . Augmentin [Amoxicillin-Pot Clavulanate] Diarrhea and Nausea And Vomiting  . Codeine   . Gabapentin   . Krill Oil Hives  . Propoxyphene N-Acetaminophen     REACTION: hives,dizzy,gi upset     Current Outpatient Medications  Medication Sig Dispense Refill  . aspirin EC 81 MG tablet Take 1 tablet (81 mg total) by mouth daily.    . carvedilol (COREG) 3.125 MG tablet Take 1 tablet (3.125 mg total) by mouth 2 (two) times daily. 180 tablet 1  . Flaxseed, Linseed, (FLAXSEED OIL PO) Take by mouth. Takes 1- 2 daily.    [name] LORazepam (ATIVAN) 0.5 MG tablet Take 1 tablet (0.5 mg total) by mouth 2 (two) times daily as needed for anxiety. (Patient not taking: Reported on 11/03/2016) 24 tablet 0  . MAGNESIUM PO Take 1 tablet by mouth daily.     . Methylsulfonylmethane (MSM PO) Take by mouth daily.    . mirtazapine (REMERON) 15 MG tablet Take 1 tablet (15 mg total) by mouth at bedtime as needed. (Patient not taking: Reported on 10/16/2016) 30 tablet 1  . Multiple Vitamin (MULTIVITAMIN) tablet Take 1 tablet by mouth daily.      . Ranitidine HCl (RANITIDINE 75 PO) Take 1 tablet by mouth 2 (two) times daily.     . Vitamins A & D (VITAMIN A & D) 10000-400 units TABS Take 1 tablet every other day by mouth.     No current facility-administered medications for this visit.      Past Medical History:  Diagnosis Date  . Allergy   . Anemia   . Anxiety   . Arrhythmia    heart  . CHF (congestive heart failure) ([place])   . Diverticulosis of colon (without mention of hemorrhage)   .  GERD with stricture   . LFYBOFBP(102.5)    consult dr Tomma Rakers visual migraine?  agg by HRT?  [name] Heart murmur   . Hiatal hernia   . Hyperlipidemia   . Hypertension   . Mitral valve disorder   . PVC (premature ventricular contraction)   . Seizures ([place])     ROS:   All systems reviewed and negative except as noted in the HPI.   Past Surgical History:  Procedure Laterality Date  . ABDOMINAL HYSTERECTOMY  1987  . ADENOIDECTOMY    . APPENDECTOMY  1987  . BREAST SURGERY Right 1990   biopsy,benign  . CARDIAC CATHETERIZATION  2009   No CAD. LVEF 50%.  . COLONOSCOPY    . ESOPHAGOGASTRODUODENOSCOPY    . FOOT SURGERY Right 2011  . TONSILLECTOMY  1953  . TUBAL LIGATION       Family History  Problem Relation Age of Onset  . Diabetes Mother   . Glaucoma Mother   . Diabetes Son   . Diabetes Sister        1/2 sister-same mom  . Heart murmur Sister   . Arrhythmia Sister   . Breast cancer Maternal Grandmother   . Hypertension Brother   . Heart murmur Brother   . Arrhythmia Brother   .  Diabetes Brother   . Colon cancer Neg Hx      Social History   Socioeconomic History  . Marital status: Married    Spouse name: Not on file  . Number of children: 1  . Years of education: Not on file  . Highest education level: Not on file  Social Needs  . Financial resource strain: Not on file  . Food insecurity - worry: Not on file  . Food insecurity - inability: Not on file  . Transportation needs - medical: Not on file  . Transportation needs - non-medical: Not on file  Occupational History  . Occupation: retired  Tobacco Use  . Smoking status: Never Smoker  . Smokeless tobacco: Never Used  Substance and Sexual Activity  . Alcohol use: No  . Drug use: No  . Sexual activity: Not on file  Other Topics Concern  . Not on file  Social History Narrative   HHof 2 dog    Just  Died   [name]    Retired and married   [name]      Dog with health issues  Cooks for him bladder stones      BP 126/76   Pulse 76   Ht 5\' 5"  (1.651 m)   Wt 149 lb (67.6 kg)   SpO2 98%   BMI 24.79 kg/m   Physical Exam:  stable appearing 76 yo woman, NAD HEENT: Unremarkable Neck:  6 cm JVD, no thyromegally Lymphatics:  No adenopathy Back:  No CVA tenderness Lungs:  Clear  With no wheezes HEART:  IRegular rate rhythm, no murmurs, no rubs, no clicks Abd:  soft, positive bowel sounds, no organomegally, no rebound, no guarding Ext:  2 plus pulses, no edema, no cyanosis, no clubbing Skin:  No rashes no nodules Neuro:  CN II through XII intact, motor grossly intact   DEVICE  Normal device function.  See PaceArt for details.   Assess/Plan: 1. PVC's - she appears to be symptomatic. We will decide on which AA drug to use based on the results of her heart cath. 2. Chronic systolic heart failure - her symptoms are class 2. She will continue her beta blocker and start an ACE/ARB once she has had her heart cath 3.chest pain - her symptoms are both typical and atypical. With her LV dysfunction, she will undergo cardiac cath.  Alisha Brown.D.

## 2016-12-09 ENCOUNTER — Encounter: Payer: Self-pay | Admitting: Internal Medicine

## 2016-12-09 ENCOUNTER — Ambulatory Visit: Payer: Medicare HMO | Admitting: Internal Medicine

## 2016-12-09 LAB — CBC WITH DIFFERENTIAL/PLATELET
BASOS ABS: 0 10*3/uL (ref 0.0–0.2)
BASOS: 1 %
EOS (ABSOLUTE): 0.2 10*3/uL (ref 0.0–0.4)
Eos: 4 %
HEMATOCRIT: 40.5 % (ref 34.0–46.6)
Hemoglobin: 13.7 g/dL (ref 11.1–15.9)
IMMATURE GRANS (ABS): 0 10*3/uL (ref 0.0–0.1)
Immature Granulocytes: 0 %
LYMPHS ABS: 2.5 10*3/uL (ref 0.7–3.1)
Lymphs: 38 %
MCH: 29.6 pg (ref 26.6–33.0)
MCHC: 33.8 g/dL (ref 31.5–35.7)
MCV: 88 fL (ref 79–97)
Monocytes Absolute: 0.6 10*3/uL (ref 0.1–0.9)
Monocytes: 9 %
NEUTROS PCT: 48 %
Neutrophils Absolute: 3.2 10*3/uL (ref 1.4–7.0)
Platelets: 242 10*3/uL (ref 150–379)
RBC: 4.63 x10E6/uL (ref 3.77–5.28)
RDW: 14.1 % (ref 12.3–15.4)
WBC: 6.5 10*3/uL (ref 3.4–10.8)

## 2016-12-09 LAB — PROTIME-INR
INR: 1 (ref 0.8–1.2)
PROTHROMBIN TIME: 10.3 s (ref 9.1–12.0)

## 2016-12-09 LAB — BASIC METABOLIC PANEL
BUN / CREAT RATIO: 22 (ref 12–28)
BUN: 20 mg/dL (ref 8–27)
CALCIUM: 9.7 mg/dL (ref 8.7–10.3)
CHLORIDE: 101 mmol/L (ref 96–106)
CO2: 24 mmol/L (ref 20–29)
CREATININE: 0.9 mg/dL (ref 0.57–1.00)
GFR calc Af Amer: 73 mL/min/{1.73_m2} (ref >59)
GFR, EST NON AFRICAN AMERICAN: 63 mL/min/{1.73_m2} (ref >59)
GLUCOSE: 91 mg/dL (ref 65–99)
POTASSIUM: 4.4 mmol/L (ref 3.5–5.2)
Sodium: 141 mmol/L (ref 134–144)

## 2016-12-09 NOTE — Telephone Encounter (Signed)
Patient contacted pre-catheterization at [facility] scheduled for:  12/10/2016 @ 1330 Verified arrival time and place:  NT @ 1130 Confirmed AM meds to be taken pre-cath with sip of water: Pt advised to take ASA, carvedilol and anxiety medication if desired Confirmed patient has responsible person to drive home post procedure and observe patient for 24 hours:  yes Addl concerns:  All questions answered

## 2016-12-10 ENCOUNTER — Ambulatory Visit (HOSPITAL_COMMUNITY)
Admission: RE | Admit: 2016-12-10 | Discharge: 2016-12-10 | Disposition: A | Discharge status: Home / Self Care | Payer: Medicare HMO | Source: Ambulatory Visit | Attending: Internal Medicine | Admitting: Internal Medicine

## 2016-12-10 ENCOUNTER — Encounter (HOSPITAL_COMMUNITY)
Admission: RE | Disposition: A | Discharge status: Home / Self Care | Payer: Self-pay | Source: Ambulatory Visit | Attending: Internal Medicine

## 2016-12-10 DIAGNOSIS — I5022 Chronic systolic (congestive) heart failure: Secondary | ICD-10-CM | POA: Diagnosis not present

## 2016-12-10 DIAGNOSIS — I428 Other cardiomyopathies: Secondary | ICD-10-CM | POA: Insufficient documentation

## 2016-12-10 DIAGNOSIS — K219 Gastro-esophageal reflux disease without esophagitis: Secondary | ICD-10-CM | POA: Insufficient documentation

## 2016-12-10 DIAGNOSIS — R569 Unspecified convulsions: Secondary | ICD-10-CM | POA: Insufficient documentation

## 2016-12-10 DIAGNOSIS — I429 Cardiomyopathy, unspecified: Secondary | ICD-10-CM

## 2016-12-10 DIAGNOSIS — R079 Chest pain, unspecified: Secondary | ICD-10-CM

## 2016-12-10 DIAGNOSIS — Z7982 Long term (current) use of aspirin: Secondary | ICD-10-CM | POA: Insufficient documentation

## 2016-12-10 DIAGNOSIS — E785 Hyperlipidemia, unspecified: Secondary | ICD-10-CM | POA: Insufficient documentation

## 2016-12-10 DIAGNOSIS — F419 Anxiety disorder, unspecified: Secondary | ICD-10-CM | POA: Insufficient documentation

## 2016-12-10 DIAGNOSIS — I493 Ventricular premature depolarization: Secondary | ICD-10-CM | POA: Diagnosis not present

## 2016-12-10 DIAGNOSIS — I110 Hypertensive heart disease with heart failure: Secondary | ICD-10-CM | POA: Insufficient documentation

## 2016-12-10 DIAGNOSIS — Z880 Allergy status to penicillin: Secondary | ICD-10-CM | POA: Insufficient documentation

## 2016-12-10 HISTORY — PX: RIGHT/LEFT HEART CATH AND CORONARY ANGIOGRAPHY: CATH118266

## 2016-12-10 LAB — POCT I-STAT 3, VENOUS BLOOD GAS (G3P V)
ACID-BASE EXCESS: 1 mmol/L (ref 0.0–2.0)
BICARBONATE: 27.1 mmol/L (ref 20.0–28.0)
O2 SAT: 74 %
PO2 VEN: 40 mmHg (ref 32.0–45.0)
TCO2: 29 mmol/L (ref 22–32)
pCO2, Ven: 46.6 mmHg (ref 44.0–60.0)
pH, Ven: 7.372 (ref 7.250–7.430)

## 2016-12-10 LAB — POCT I-STAT 3, ART BLOOD GAS (G3+)
Acid-Base Excess: 1 mmol/L (ref 0.0–2.0)
BICARBONATE: 26.4 mmol/L (ref 20.0–28.0)
O2 Saturation: 98 %
PH ART: 7.402 (ref 7.350–7.450)
PO2 ART: 100 mmHg (ref 83.0–108.0)
TCO2: 28 mmol/L (ref 22–32)
pCO2 arterial: 42.4 mmHg (ref 32.0–48.0)

## 2016-12-10 SURGERY — RIGHT/LEFT HEART CATH AND CORONARY ANGIOGRAPHY
Anesthesia: LOCAL

## 2016-12-10 MED ORDER — SODIUM CHLORIDE 0.9% FLUSH: 3.0000 mL | Freq: Two times a day (BID) | INTRAVENOUS | Status: DC

## 2016-12-10 MED ORDER — VERAPAMIL HCL 2.5 MG/ML IV SOLN
INTRAVENOUS | Status: DC | PRN
  Administered 2016-12-10: 10 mL via INTRA_ARTERIAL

## 2016-12-10 MED ORDER — VERAPAMIL HCL 2.5 MG/ML IV SOLN
INTRAVENOUS | Status: AC
  Filled 2016-12-10: qty 2

## 2016-12-10 MED ORDER — SODIUM CHLORIDE 0.9 % WEIGHT BASED INFUSION
3.0000 mL/kg/h | INTRAVENOUS | Status: AC
  Administered 2016-12-10: 3 mL/kg/h via INTRAVENOUS

## 2016-12-10 MED ORDER — ONDANSETRON HCL 4 MG/2ML IJ SOLN
4.0000 mg | Freq: Once | INTRAMUSCULAR | Status: AC
  Administered 2016-12-10: 4 mg via INTRAVENOUS

## 2016-12-10 MED ORDER — MIDAZOLAM HCL 2 MG/2ML IJ SOLN
INTRAMUSCULAR | Status: DC | PRN
  Administered 2016-12-10: 1 mg via INTRAVENOUS

## 2016-12-10 MED ORDER — FENTANYL CITRATE (PF) 100 MCG/2ML IJ SOLN
INTRAMUSCULAR | Status: AC
  Filled 2016-12-10: qty 2

## 2016-12-10 MED ORDER — SODIUM CHLORIDE 0.9 % WEIGHT BASED INFUSION: 1.0000 mL/kg/h | INTRAVENOUS | Status: DC

## 2016-12-10 MED ORDER — SODIUM CHLORIDE 0.9 % IV SOLN: INTRAVENOUS | Status: AC

## 2016-12-10 MED ORDER — IOPAMIDOL (ISOVUE-370) INJECTION 76%
INTRAVENOUS | Status: DC | PRN
  Administered 2016-12-10: 60 mL via INTRA_ARTERIAL

## 2016-12-10 MED ORDER — ONDANSETRON HCL 4 MG/2ML IJ SOLN
INTRAMUSCULAR | Status: AC
  Administered 2016-12-10: 4 mg via INTRAVENOUS
  Filled 2016-12-10: qty 2

## 2016-12-10 MED ORDER — HEPARIN SODIUM (PORCINE) 1000 UNIT/ML IJ SOLN
INTRAMUSCULAR | Status: DC | PRN
  Administered 2016-12-10: 3500 [IU] via INTRAVENOUS

## 2016-12-10 MED ORDER — ASPIRIN 81 MG PO CHEW: 81.0000 mg | CHEWABLE_TABLET | ORAL | Status: DC

## 2016-12-10 MED ORDER — METOCLOPRAMIDE HCL 5 MG/ML IJ SOLN
5.0000 mg | Freq: Once | INTRAMUSCULAR | Status: AC
  Administered 2016-12-10: 5 mg via INTRAVENOUS
  Filled 2016-12-10: qty 1

## 2016-12-10 MED ORDER — MIDAZOLAM HCL 2 MG/2ML IJ SOLN
INTRAMUSCULAR | Status: AC
  Filled 2016-12-10: qty 2

## 2016-12-10 MED ORDER — FENTANYL CITRATE (PF) 100 MCG/2ML IJ SOLN
INTRAMUSCULAR | Status: DC | PRN
  Administered 2016-12-10: 50 ug via INTRAVENOUS
  Administered 2016-12-10: 25 ug via INTRAVENOUS

## 2016-12-10 MED ORDER — IOPAMIDOL (ISOVUE-370) INJECTION 76%
INTRAVENOUS | Status: AC
  Filled 2016-12-10: qty 100

## 2016-12-10 MED ORDER — SODIUM CHLORIDE 0.9 % IV SOLN: 250.0000 mL | INTRAVENOUS | Status: DC | PRN

## 2016-12-10 MED ORDER — SODIUM CHLORIDE 0.9% FLUSH: 3.0000 mL | INTRAVENOUS | Status: DC | PRN

## 2016-12-10 MED ORDER — HEPARIN (PORCINE) IN NACL 2-0.9 UNIT/ML-% IJ SOLN
INTRAMUSCULAR | Status: AC
  Filled 2016-12-10: qty 1000

## 2016-12-10 MED ORDER — HEPARIN (PORCINE) IN NACL 2-0.9 UNIT/ML-% IJ SOLN
INTRAMUSCULAR | Status: AC | PRN
  Administered 2016-12-10: 1000 mL via INTRA_ARTERIAL

## 2016-12-10 MED ORDER — LOSARTAN POTASSIUM 25 MG PO TABS: 25.0000 mg | ORAL_TABLET | Freq: Every day | ORAL | 11 refills | Status: DC

## 2016-12-10 MED ORDER — LIDOCAINE HCL (PF) 1 % IJ SOLN
INTRAMUSCULAR | Status: AC
  Filled 2016-12-10: qty 30

## 2016-12-10 MED ORDER — LIDOCAINE HCL (PF) 1 % IJ SOLN
INTRAMUSCULAR | Status: DC | PRN
  Administered 2016-12-10: 5 mL

## 2016-12-10 MED ORDER — HEPARIN SODIUM (PORCINE) 1000 UNIT/ML IJ SOLN
INTRAMUSCULAR | Status: AC
  Filled 2016-12-10: qty 1

## 2016-12-10 SURGICAL SUPPLY — 13 items
CATH 5FR JL3.5 JR4 ANG PIG MP (CATHETERS) ×2 IMPLANT
CATH BALLN WEDGE 5F 110CM (CATHETERS) ×2 IMPLANT
DEVICE RAD COMP TR BAND LRG (VASCULAR PRODUCTS) ×2 IMPLANT
GLIDESHEATH SLEND SS 6F .021 (SHEATH) ×2 IMPLANT
GUIDEWIRE INQWIRE 1.5J.035X260 (WIRE) ×1 IMPLANT
INQWIRE 1.5J .035X260CM (WIRE) ×2
KIT HEART LEFT (KITS) ×2 IMPLANT
PACK CARDIAC CATHETERIZATION (CUSTOM PROCEDURE TRAY) ×2 IMPLANT
SHEATH GLIDE SLENDER 4/5FR (SHEATH) ×2 IMPLANT
SYR MEDRAD MARK V 150ML (SYRINGE) ×2 IMPLANT
TRANSDUCER W/STOPCOCK (MISCELLANEOUS) ×2 IMPLANT
TUBING CIL FLEX 10 FLL-RA (TUBING) ×2 IMPLANT
WIRE HI TORQ VERSACORE-J 145CM (WIRE) ×2 IMPLANT

## 2016-12-10 NOTE — Progress Notes (Signed)
Pt states she does not feel any better. C/O dizziness and nausea.  VSS TRB WNL deflating per protocol.  [place] cardiology notified,  Will assess pt

## 2016-12-10 NOTE — Discharge Instructions (Signed)

## 2016-12-10 NOTE — Progress Notes (Signed)
Site area: rt ac venous sheath Site Prior to Removal:  Level 0 Pressure Applied For:  10 minutes Manual:   yes Patient Status During Pull:  stable Post Pull Site:  Level  0 Post Pull Instructions Given:  yes Post Pull Pulses Present: palpable Dressing Applied:  Gauze and tegaderm Bedrest begins @  NA Comments:

## 2016-12-10 NOTE — Progress Notes (Signed)
Pt C/O nausea and dizziness.  TRB WNL/ VSS [facility] cardiology notified. Zofran ordered and given.  Pt resting

## 2016-12-10 NOTE — Progress Notes (Signed)
Lindsey,PA came to access pt.  Dr. Saunders Revel also came and assessed pt.  Orders for reglan.  Pharmacy notified that pt needs dose ASAP

## 2016-12-10 NOTE — Interval H&P Note (Signed)
History and Physical Interval Note:  12/10/2016 12:55 PM  Alisha Brown  has presented today for cardiac catheterization, with the diagnosis of chest pain, lightheadedness, and cardiomyopathy. The various methods of treatment have been discussed with the patient and family. After consideration of risks, benefits and other options for treatment, the patient has consented to  Procedure(s): RIGHT/LEFT HEART CATH AND CORONARY ANGIOGRAPHY (N/A) as a surgical intervention .  The patient's history has been reviewed, patient examined, no change in status, stable for surgery.  I have reviewed the patient's chart and labs.  Questions were answered to the patient's satisfaction.    Cath Lab Visit (complete for each Cath Lab visit)  Clinical Evaluation Leading to the Procedure:   ACS: No.  Non-ACS:    Anginal Classification: CCS IV  Anti-ischemic medical therapy: Minimal Therapy (1 class of medications)  Non-Invasive Test Results: Low-risk stress test findings: cardiac mortality <1%/year  Prior CABG: No previous CABG   Judit Awad

## 2016-12-10 NOTE — Op Note (Signed)
BRIEF CARDIAC CATHETERIZATION NOTE  DATE: 12/10/2016 TIME: 1:57 PM  PATIENT:  Alisha Brown  76 y.o. female  PRE-OPERATIVE DIAGNOSIS:  Chest pain, cardiomyopathy  POST-OPERATIVE DIAGNOSIS:  NICM  PROCEDURE:  Procedure(s): RIGHT/LEFT HEART CATH AND CORONARY ANGIOGRAPHY (N/A)  SURGEON:  Surgeon(s) and Role:    Nelva Bush, MD - Primary  FINDINGS: 1. No angiographically significant coronary artery disease. 2. Moderate to severely reduced LVEF. 3. Normal left and right heart filling pressures. 4. Normal Fick cardiac output.  RECOMMENDATIONS: 1. Continue carvedilol and add losartan 25 mg daily. 2. F/u in the office as planned on 11/26; plan to repeat BMP at that time. 3. Further workup/management of PVCs and NSVT, per Dr. Lovena Le.  Nelva Bush, MD [facility] Pager: [phone]

## 2016-12-10 NOTE — Progress Notes (Signed)
Pt states she is feeling much better after the hot tea and reglan.  Pt's husband at bedside.  TRB off. Small hematoma formed when TRB taken off.  Pressure held x 15 min.  Hematoma resolved. Bruise remains but radial site soft.  Dsg applied.  Report given to Guerry Minors

## 2016-12-11 ENCOUNTER — Encounter (HOSPITAL_COMMUNITY): Payer: Self-pay | Admitting: Internal Medicine

## 2016-12-11 ENCOUNTER — Telehealth: Payer: Self-pay | Admitting: Internal Medicine

## 2016-12-11 NOTE — Telephone Encounter (Signed)
Pt states during the night she woke up with hives, from her underneath her breast, down,past her groin, no other location. Pt states she is a little uncomfortable, not particularly itchy, slight nausea but denies other symptoms including shortness of breath, wheezing. Pt states she does not want to take oral prednisone, that has caused her lips to swell in the past. Pt states she is comfortable with using benadryl 25 mg prn, she knows not to drive when she takes this,  and topical benadryl prn.  Pt advised I will forward to Dr End for review.

## 2016-12-11 NOTE — Telephone Encounter (Signed)
I discussed Dr Darnelle Bos recommendations with patient, she verbalized understanding.

## 2016-12-11 NOTE — Telephone Encounter (Signed)
I agree with using diphenhydramine 25 mg Q6 hours prn for treatment of hives. If symptoms worsen over the weekend, she should proceed to urgent care or ED for further evaluation. This presumably represents a delayed reaction to contrast from yesterdays catheterization. Iodinated contrast has been added to the patient's allergies list.  Nelva Bush, MD [facility] Pager: [phone]

## 2016-12-11 NOTE — Telephone Encounter (Signed)
LMTCB

## 2016-12-11 NOTE — Telephone Encounter (Signed)
New message    Patient had heart cath on 11/15. Patient calling states she has hives. Wants to know if she can  take Benadryl?

## 2016-12-16 ENCOUNTER — Other Ambulatory Visit: Payer: Self-pay

## 2016-12-16 ENCOUNTER — Encounter: Payer: Self-pay | Admitting: *Deleted

## 2016-12-16 ENCOUNTER — Emergency Department
Admission: EM | Admit: 2016-12-16 | Discharge: 2016-12-16 | Disposition: A | Discharge status: Home / Self Care | Payer: Medicare HMO | Source: Home / Self Care | Attending: Family Medicine | Admitting: Family Medicine

## 2016-12-16 DIAGNOSIS — L500 Allergic urticaria: Secondary | ICD-10-CM

## 2016-12-16 MED ORDER — CETIRIZINE HCL 10 MG PO TABS: 10.0000 mg | ORAL_TABLET | Freq: Every day | ORAL | 0 refills | Status: DC

## 2016-12-16 MED ORDER — HYDROXYZINE HCL 10 MG PO TABS: ORAL_TABLET | ORAL | 0 refills | Status: DC

## 2016-12-16 NOTE — ED Triage Notes (Signed)
Pt c/o hives all over since having an allergic reaction after a heart cath on 12/10/16. She has taken Benadryl with some relief. Last dose Benadryl was last night.

## 2016-12-16 NOTE — Discharge Instructions (Signed)
°  Atarax (hydroxizine) is an antihistamine that can be taken to help with itching. This medication can cause drowsiness so do not drive or drink alcohol while taking.   It is recommended that you start by taking 1 pill after dinner and seeing how you do.  You may take up to 2 pills per night but do not take more than prescribed.   If rash is still not improving by this weekend, it is recommended you follow up with your primary care provider who may want to slowly take you off certain medications that could be causing a delayed allergic reaction.

## 2016-12-16 NOTE — ED Provider Notes (Signed)
[facility]    CSN: [id number] Arrival date & time: 12/16/16  1508     History   Chief Complaint Chief Complaint  Patient presents with  . Urticaria    HPI Alisha Brown is a 76 y.o. female.   HPI  Alisha Brown is a 76 y.o. female presenting to UC with c/o gradually worsening hives all over her body that started after having a heart cath on 12/10/16.  Pt states she called the on-call nurse with the cardiologist when rash initially started. She was advised to take Benadryl.  She has been taking but tries not to take it too over due to concern for taking with her heart problems.  She has had minimal relief. Last dose of Benadryl was last night.  Denies having a reaction to Benadryl but is concerned the rash continues to worsen.  Denies oral swelling or SOB.  She has been on long-term nitrofurantoin for about 1 month, as prescribed by her urologist due to a recurrent UTI.  She is suppose to keep taking for at least 6 months.  She also started carvedilol about 1 month ago but pt is certain symptoms started after the heart cath.  She has had hives in the past that have lasted a few days but never this severe or this long.  She reports facial swelling when taking Prednisone.    Past Medical History:  Diagnosis Date  . Allergy   . Anemia   . Anxiety   . Arrhythmia    heart  . CHF (congestive heart failure) ([place])   . Diverticulosis of colon (without mention of hemorrhage)   . GERD with stricture   . ATFTDDUK(025.4)    consult dr Tomma Rakers visual migraine?  agg by HRT?  [name] Heart murmur   . Hiatal hernia   . Hyperlipidemia   . Hypertension   . Mitral valve disorder   . PVC (premature ventricular contraction)   . Seizures [facility])     Patient Active Problem List   Diagnosis Date Noted  . Chest pain 10/17/2016  . PVC's (premature ventricular contractions) 10/17/2016  . NSVT (nonsustained ventricular tachycardia) ([place]) 10/17/2016  . Lower abdominal pain 06/28/2014  .  Elevated blood pressure reading 11/07/2013  . Facial pain 05/03/2013  . Face pain 03/22/2013  . Estrogen deficiency 03/22/2013  . Irritability 03/22/2013  . Allergic dermatitis 08/15/2012  . Palpitations 05/30/2012  . History of esophageal stricture 05/30/2012  . Other specified cardiac dysrhythmias(427.89) 04/11/2012  . Elevated lipids 04/11/2012  . Abnormal thyroid exam 04/11/2012  . Vision changes  episodic  03/17/2012  . Menopausal hot flushes 03/17/2012  . Anxiety reaction 05/27/2011  . Sleep related teeth grinding 04/08/2011  . Menopausal syndrome (hot flashes) 04/08/2011  . Postmenopausal HRT (hormone replacement therapy) 04/08/2011  . Itching 04/08/2011  . Tinnitus 04/08/2011  . GERD (gastroesophageal reflux disease) 10/24/2010  . Dysuria 06/15/2010  . Cardiac dysrhythmia 03/23/2010  . Cardiomyopathy ([place]) 03/23/2010  . Visit for preventive health examination 03/23/2010  . HYPERGLYCEMIA 02/04/2009  . HIATAL HERNIA 10/17/2008  . GERD 11/18/2007  . OTHER DYSPHAGIA 11/18/2007  . MORTON'S NEUROMA 10/24/2007  . HEPATIC CYST 10/06/2007  . MUSCLE CRAMPS, FOOT 09/28/2007  . HYPERSOMNIA UNSPECIFIED 12/30/2006  . HYPERLIPIDEMIA 10/26/2006  . Essential hypertension 10/26/2006  . ALLERGIC RHINITIS 10/26/2006  . MENOPAUSE-RELATED VASOMOTOR SYMPTOMS, HOT FLASHES 10/26/2006  . DISORDER, BONE/CARTILAGE NOS 10/26/2006  . SEIZURE DISORDER 10/26/2006  . FATIGUE 10/26/2006  . CARDIAC MURMUR 10/26/2006  . Personal history of  urinary disorder 10/26/2006    Past Surgical History:  Procedure Laterality Date  . ABDOMINAL HYSTERECTOMY  1987  . ADENOIDECTOMY    . APPENDECTOMY  1987  . BREAST SURGERY Right 1990   biopsy,benign  . CARDIAC CATHETERIZATION  2009   No CAD. LVEF 50%.  . COLONOSCOPY    . ESOPHAGOGASTRODUODENOSCOPY    . FOOT SURGERY Right 2011  . RIGHT/LEFT HEART CATH AND CORONARY ANGIOGRAPHY N/A 12/10/2016   Procedure: RIGHT/LEFT HEART CATH AND CORONARY ANGIOGRAPHY;   Surgeon: Nelva Bush, MD;  Location: [facility];  Service: Cardiovascular;  Laterality: N/A;  . TONSILLECTOMY  1953  . TUBAL LIGATION      OB History    No data available       Home Medications    Prior to Admission medications   Medication Sig Start Date End Date Taking? Authorizing Provider  aspirin EC 81 MG tablet Take 1 tablet (81 mg total) by mouth daily. 10/16/16   End, Harrell Gave, MD  carvedilol (COREG) 3.125 MG tablet Take 1 tablet (3.125 mg total) by mouth 2 (two) times daily. 10/16/16 01/14/17  End, Harrell Gave, MD  cetirizine (ZYRTEC) 10 MG tablet Take 1 tablet (10 mg total) by mouth daily. 12/16/16   Noe Gens, PA-C  cholecalciferol (VITAMIN D) 1000 units tablet Take 1,000 Units every other day by mouth.    [provider]  Flaxseed, Linseed, (FLAXSEED OIL) 1000 MG CAPS Take 1,000 mg daily by mouth.    [provider]  hydrOXYzine (ATARAX/VISTARIL) 10 MG tablet Take 1-2 tables (10-20mg ) at night for itching rash 12/16/16   Noe Gens, PA-C  ketotifen (ZADITOR) 0.025 % ophthalmic solution Place 1-2 drops 2 (two) times daily as needed into both eyes (for itchy eye).    [provider]  LORazepam (ATIVAN) 0.5 MG tablet Take 1 tablet (0.5 mg total) by mouth 2 (two) times daily as needed for anxiety. Patient taking differently: Take 0.5 mg 2 (two) times daily as needed by mouth for anxiety. For anxiety/pain. 08/07/16   Panosh, Standley Brooking, MD  losartan (COZAAR) 25 MG tablet Take 1 tablet (25 mg total) daily by mouth. 12/10/16 12/10/17  End, Harrell Gave, MD  Magnesium 500 MG CAPS Take 500 mg daily by mouth.    [provider]  Methylsulfonylmethane (MSM) 1000 MG CAPS Take 1,000 mg daily by mouth.    [provider]  mirtazapine (REMERON) 15 MG tablet Take 1 tablet (15 mg total) by mouth at bedtime as needed. Patient not taking: Reported on 10/16/2016 08/07/16   Panosh, Standley Brooking, MD  nitrofurantoin (MACRODANTIN) 50 MG capsule  Take 50 mg at bedtime by mouth.     [provider]  Ophthalmic Irrigation Solution (RA STERILE EYE [place] OP) Place 1 application daily into both eyes.    [provider]  ranitidine (ZANTAC) 75 MG tablet Take 75 mg 2 (two) times daily by mouth.    [provider]  vitamin B-12 (CYANOCOBALAMIN) 1000 MCG tablet Take 1,000 mcg every other day by mouth.    [provider]    Family History Family History  Problem Relation Age of Onset  . Diabetes Mother   . Glaucoma Mother   . Diabetes Son   . Diabetes Sister        1/2 sister-same mom  . Heart murmur Sister   . Arrhythmia Sister   . Breast cancer Maternal Grandmother   . Hypertension Brother   . Heart murmur Brother   .  Arrhythmia Brother   . Diabetes Brother   . Colon cancer Neg Hx     Social History Social History   Tobacco Use  . Smoking status: Never Smoker  . Smokeless tobacco: Never Used  Substance Use Topics  . Alcohol use: No  . Drug use: No     Allergies   Contrast media [iodinated diagnostic agents]; Aleve [naproxen sodium]; Augmentin [amoxicillin-pot clavulanate]; Codeine; Gabapentin; Krill oil; and Propoxyphene n-acetaminophen   Review of Systems Review of Systems  Constitutional: Negative for chills and fever.  HENT: Negative for sore throat, trouble swallowing and voice change.   Respiratory: Negative for chest tightness, shortness of breath, wheezing and stridor.   Cardiovascular: Negative for chest pain, palpitations and leg swelling.  Gastrointestinal: Negative for nausea and vomiting.  Musculoskeletal: Negative for joint swelling and myalgias.  Skin: Positive for color change and rash. Negative for wound.     Physical Exam Triage Vital Signs ED Triage Vitals  Enc Vitals Group     BP 12/16/16 1526 (!) 144/82     Pulse Rate 12/16/16 1526 70     Resp 12/16/16 1526 16     Temp 12/16/16 1526 97.8 F (36.6 C)     Temp Source 12/16/16 1526 Oral     SpO2 12/16/16  1526 99 %     Weight 12/16/16 1526 151 lb (68.5 kg)     Height 12/16/16 1526 5\' 5"  (1.651 m)     Head Circumference --      Peak Flow --      Pain Score 12/16/16 1527 0     Pain Loc --      Pain Edu? --      Excl. in [place]? --    No data found.  Updated Vital Signs BP (!) 144/82 (BP Location: Left Arm)   Pulse 70   Temp 97.8 F (36.6 C) (Oral)   Resp 16   Ht 5\' 5"  (1.651 m)   Wt 151 lb (68.5 kg)   SpO2 99%   BMI 25.13 kg/m   Visual Acuity Right Eye Distance:   Left Eye Distance:   Bilateral Distance:    Right Eye Near:   Left Eye Near:    Bilateral Near:     Physical Exam  Constitutional: She is oriented to person, place, and time. She appears well-developed and well-nourished. No distress.  HENT:  Head: Normocephalic and atraumatic.  Eyes: EOM are normal.  Neck: Normal range of motion.  Cardiovascular: Normal rate and regular rhythm.  Pulmonary/Chest: Effort normal and breath sounds normal. No stridor. No respiratory distress. She has no wheezes. She has no rales.  Musculoskeletal: Normal range of motion.  Neurological: She is alert and oriented to person, place, and time.  Skin: Skin is warm and dry. Rash noted. She is not diaphoretic. There is erythema.  Diffuse urticaria from chin to lower legs. Worse around lower back and bilateral thighs.  Rash does blanch. Yellowed old appearing ecchymosis on Right wrist at site of recent cath.   Psychiatric: She has a normal mood and affect. Her behavior is normal.  Nursing note and vitals reviewed.    UC Treatments / Results  Labs (all labs ordered are listed, but only abnormal results are displayed) Labs Reviewed - No data to display  EKG  EKG Interpretation None       Radiology No results found.  Procedures Procedures (including critical care time)  Medications Ordered in UC Medications - No data to display  Initial Impression / Assessment and Plan / UC Course  I have reviewed the triage vital signs  and the nursing notes.  Pertinent labs & imaging results that were available during my care of the patient were reviewed by me and considered in my medical decision making (see chart for details).     Pt c/o urticaria since 12/10/16.  Cannot give pt steroids due to concern for facial swelling last time she took Prednisone. Will have pt stop the Benadryl and try Atarax at night and cetirizine during the day F/u with PCP early next week if not improving, question if hives due to a medication she is taking or other external factors. Discussed symptoms that warrant emergent care in the ED.   Final Clinical Impressions(s) / UC Diagnoses   Final diagnoses:  Allergic urticaria    ED Discharge Orders        Ordered    cetirizine (ZYRTEC) 10 MG tablet  Daily     12/16/16 1545    hydrOXYzine (ATARAX/VISTARIL) 10 MG tablet     12/16/16 1545       Controlled Substance Prescriptions [place] Controlled Substance Registry consulted? Not Applicable   Tyrell Antonio 12/16/16 1601

## 2016-12-20 NOTE — Progress Notes (Signed)
Follow-up Outpatient Visit Date: 12/21/2016  Primary Care Provider: Burnis Medin, [place] [address]  Chief Complaint: Chest pain and rash post catheterization  HPI:  Alisha Brown is a 76 y.o. year-old female with history of nonischemic cardiomyopathy, frequent PVCs and nonsustained ventricular tachycardia, hypertension, and hyperlipidemia, who presents for follow-up of cardiomyopathy and PVCs/NSVT.  I last saw her in the office on 10/16/16.  At that time, Alisha Brown reported almost constant chest pain that has been present for years.  We performed a myocardial perfusion stress test, which did not show any evidence of ischemia.  Alisha Brown was also evaluated in the office by Dr. Lovena Le, who remained recommended cardiac catheterization.  This was performed on 12/10/16 and showed no evidence of CAD.  Left and right heart filling pressures, as well as cardiac output, were normal.  LVEF was severely reduced.  The day after the catheterization, Alisha Brown developed hives which persisted despite using diphenhydramine.  She was hesitant to try steroids, due to a history of facial swelling with prednisone.  She was seen at the urgent care clinic 5 days ago due to persistent hives and was given a prescription for hydroxyzine.  The rash has improved significantly with hydroxyzine, with only a small band of redness remaining on her torso.  Since her catheterization, Alisha Brown has continued to have frequent chest pain.  Of note, this has been present for years.  She also has had some shortness of breath, similar to her baseline.  She denies palpitations but is still frequently lightheaded.  She has not started the losartan that we discussed after catheterization, as she did not wish to muddy the picture in the setting of her post catheterization rash.  --------------------------------------------------------------------------------------------------  Cardiovascular History &  Procedures: Cardiovascular Problems:  Cardiomyopathy  PVCs and nonsustained ventricular tachycardia  Risk Factors:  Hypertension, hyperlipidemia, and age > 71  Cath/PCI:  LHC/RHC (12/10/16): No angiographically significant coronary artery disease.  Normal left and right heart filling pressures.  Normal Fick cardiac output/index.  Severely reduced LV contraction (EF 30-35%).  LHC (03/30/07): LMCA normal. LAD normal. LCx normal. Dominant RCA normal. LVEF 50%.    CV Surgery:  None  EP Procedures and Devices:  48-hour Holter monitor (11/17/16): Normal sinus rhythm, sinus bradycardia, and sinus tachycardia.  Frequent PVCs and occasional NSVT.  Rare PACs.  No sustained arrhythmia.  Non-Invasive Evaluation(s):  Pharmacologic MPI (10/22/16): Low risk study with fixed basal and mid inferior/inferolateral defect that could represent artifact or scar.  No ischemia.  TTE (09/23/16): Normal LV size with LVEF 40-45% and diffuse hypokinesis. Grade 1 diastolic dysfunction. Trivial AI. Mild MR. Mild left atrial enlargement.  Recent CV Pertinent Labs: Lab Results  Component Value Date   CHOL 255 (H) 09/18/2016   HDL 70.70 09/18/2016   LDLCALC 168 (H) 09/18/2016   LDLDIRECT 169.1 05/30/2012   TRIG 84.0 09/18/2016   CHOLHDL 4 09/18/2016   INR 1.0 12/08/2016   K 4.4 12/08/2016   MG 2.2 05/30/2012   BUN 20 12/08/2016   CREATININE 0.90 12/08/2016    Past medical and surgical history were reviewed and updated in EPIC.  Current Meds  Medication Sig  . aspirin EC 81 MG tablet Take 1 tablet (81 mg total) by mouth daily.  . carvedilol (COREG) 3.125 MG tablet Take 1 tablet (3.125 mg total) by mouth 2 (two) times daily.  . cetirizine (ZYRTEC) 10 MG tablet Take 1 tablet (10 mg total) by mouth daily.  [name]  cholecalciferol (VITAMIN D) 1000 units tablet Take 1,000 Units every other day by mouth.  . Flaxseed, Linseed, (FLAXSEED OIL) 1000 MG CAPS Take 1,000 mg daily by mouth.  . hydrOXYzine  (ATARAX/VISTARIL) 10 MG tablet Take 1-2 tables (10-20mg ) at night for itching rash  . ketotifen (ZADITOR) 0.025 % ophthalmic solution Place 1-2 drops 2 (two) times daily as needed into both eyes (for itchy eye).  . LORazepam (ATIVAN) 0.5 MG tablet Take 1 tablet (0.5 mg total) by mouth 2 (two) times daily as needed for anxiety. (Patient taking differently: Take 0.5 mg 2 (two) times daily as needed by mouth for anxiety. For anxiety/pain.)  . losartan (COZAAR) 25 MG tablet Take 1 tablet (25 mg total) daily by mouth.  . Magnesium 500 MG CAPS Take 500 mg daily by mouth.  . Methylsulfonylmethane (MSM) 1000 MG CAPS Take 1,000 mg daily by mouth.  . mirtazapine (REMERON) 15 MG tablet Take 1 tablet (15 mg total) by mouth at bedtime as needed.  . nitrofurantoin (MACRODANTIN) 50 MG capsule Take 50 mg at bedtime by mouth.   . Ophthalmic Irrigation Solution (RA STERILE EYE [place] OP) Place 1 application daily into both eyes.  . ranitidine (ZANTAC) 75 MG tablet Take 75 mg 2 (two) times daily by mouth.  . vitamin B-12 (CYANOCOBALAMIN) 1000 MCG tablet Take 1,000 mcg every other day by mouth.    Allergies: Contrast media [iodinated diagnostic agents]; Aleve [naproxen sodium]; Augmentin [amoxicillin-pot clavulanate]; Codeine; Gabapentin; Krill oil; and Propoxyphene n-acetaminophen  Social History   Socioeconomic History  . Marital status: Married    Spouse name: Not on file  . Number of children: 1  . Years of education: Not on file  . Highest education level: Not on file  Social Needs  . Financial resource strain: Not on file  . Food insecurity - worry: Not on file  . Food insecurity - inability: Not on file  . Transportation needs - medical: Not on file  . Transportation needs - non-medical: Not on file  Occupational History  . Occupation: retired  Tobacco Use  . Smoking status: Never Smoker  . Smokeless tobacco: Never Used  Substance and Sexual Activity  . Alcohol use: No  . Drug use: No  . Sexual  activity: Not on file  Other Topics Concern  . Not on file  Social History Narrative   HHof 2 dog    Just  Died   [name]    Retired and married   [name]      Dog with health issues  Cooks for him bladder stones    Family History  Problem Relation Age of Onset  . Diabetes Mother   . Glaucoma Mother   . Diabetes Son   . Diabetes Sister        1/2 sister-same mom  . Heart murmur Sister   . Arrhythmia Sister   . Breast cancer Maternal Grandmother   . Hypertension Brother   . Heart murmur Brother   . Arrhythmia Brother   . Diabetes Brother   . Colon cancer Neg Hx     Review of Systems: Review of Systems  Constitutional: Negative.   HENT: Negative.   Respiratory: Positive for shortness of breath.   Cardiovascular: Positive for chest pain.  Gastrointestinal: Negative.   Genitourinary: Negative.   Musculoskeletal: Negative.   Skin: Positive for rash.  Neurological: Positive for dizziness and headaches.  Endo/Heme/Allergies: Bruises/bleeds easily.  Psychiatric/Behavioral: Negative.    --------------------------------------------------------------------------------------------------  Physical Exam: BP 122/70  Pulse 72   Ht 5\' 5"  (1.651 m)   Wt 151 lb 9.6 oz (68.8 kg)   SpO2 98%   BMI 25.23 kg/m   General: Well-developed, well-nourished woman, seated comfortably in the exam room.  She is accompanied by her husband. HEENT: No conjunctival pallor or scleral icterus. Moist mucous membranes.  OP clear. Neck: Supple without lymphadenopathy, thyromegaly, JVD, or HJR. Lungs: Normal work of breathing. Clear to auscultation bilaterally without wheezes or crackles. Heart: Regular rate and rhythm without murmurs, rubs, or gallops. Non-displaced PMI. Abd: Bowel sounds present. Soft, NT/ND without hepatosplenomegaly Ext: No lower extremity edema.  Right radial arteriotomy site is well-healed with 2+ pulse. Skin: Warm and dry.  Confluent erythematous maculopapular rash noted on low  back and abdomen.  No urticaria or blisters.  Lab Results  Component Value Date   WBC 6.5 12/08/2016   HGB 13.7 12/08/2016   HCT 40.5 12/08/2016   MCV 88 12/08/2016   PLT 242 12/08/2016    Lab Results  Component Value Date   NA 141 12/08/2016   K 4.4 12/08/2016   CL 101 12/08/2016   CO2 24 12/08/2016   BUN 20 12/08/2016   CREATININE 0.90 12/08/2016   GLUCOSE 91 12/08/2016   ALT 19 09/18/2016    Lab Results  Component Value Date   CHOL 255 (H) 09/18/2016   HDL 70.70 09/18/2016   LDLCALC 168 (H) 09/18/2016   LDLDIRECT 169.1 05/30/2012   TRIG 84.0 09/18/2016   CHOLHDL 4 09/18/2016   --------------------------------------------------------------------------------------------------  ASSESSMENT AND PLAN: Nonischemic cardiomyopathy with chronic systolic heart failure Catheterization earlier this month showed no significant CAD with normal left and right heart filling pressures.  LVEF appeared to be 30-35%, slightly lower than on preceding echo.  Alisha Brown is currently on carvedilol 3.125 mg twice daily.  She has yet to begin losartan.  I have advised her to begin taking losartan 12.5 mg daily when her rash has resolved.  If she tolerates this without lightheadedness or significant hypotension, she should increase it to 25 mg daily after 1 week.  We will plan to obtain a BMP 2 weeks after she starts the losartan.  PVCs and NSVT It is difficult to know if this is the cause of her cardiomyopathy or secondary to it.  Regardless, I think further EP evaluation is warranted.  Cardiac MRI may also be helpful.  I will have Alisha Brown follow-up with Dr. Lovena Le at his next available appointment.  I will defer ordering the cardiac MRI and discussing antiarrhythmic therapies to Dr. Lovena Le.  Chronic chest pain and shortness of breath Symptoms are long-standing.  Recent catheterization showed no clear cardiac cause for these symptoms other than low EF with preserved cardiac output and normal  intracardiac pressures.  We will start losartan, as above.  No further intervention is planned at this time.  Rash Fortunately, this is gradually improving with hydroxyzine.  I suspect this represents a delayed IV contrast reaction.  I have advised Alisha Brown to avoid IV contrast in the future.  If she needs to receive it, a premedication is advised.  Alisha Brown notes facial swelling in the past with prednisone.  Therefore, if IV contrast is being considered, it may be beneficial to refer her to an allergist for further testing and recommendations.  Follow-up: Return to clinic in 3 months.  Nelva Bush, MD 12/21/2016 11:44 AM

## 2016-12-21 ENCOUNTER — Ambulatory Visit: Payer: Medicare HMO | Admitting: Internal Medicine

## 2016-12-21 ENCOUNTER — Encounter: Payer: Self-pay | Admitting: Internal Medicine

## 2016-12-21 VITALS — BP 122/70 | HR 72 | Ht 65.0 in | Wt 151.6 lb

## 2016-12-21 DIAGNOSIS — I10 Essential (primary) hypertension: Secondary | ICD-10-CM | POA: Diagnosis not present

## 2016-12-21 NOTE — Patient Instructions (Signed)
Medication Instructions:  Start losartan 1/2 of a 25 mg tablet daily. After a week, if your blood pressure is higher than 100/60 and your are tolerating this dose (no dizziness or lightheadedness) Increase to losartan 25 mg daily (1 tablet).  Labwork: Your physician recommends that you return for lab work about 2 weeks after you start losartan.    Testing/Procedures: None   Follow-Up: Your physician recommends that you schedule a follow-up appointment with Dr Lovena Le.  Your physician recommends that you schedule a follow-up appointment in: 3 months with Dr End.        If you need a refill on your cardiac medications before your next appointment, please call your pharmacy.

## 2016-12-22 ENCOUNTER — Encounter: Payer: Self-pay | Admitting: Internal Medicine

## 2016-12-28 ENCOUNTER — Ambulatory Visit: Payer: Medicare HMO | Admitting: Internal Medicine

## 2016-12-31 ENCOUNTER — Telehealth: Payer: Self-pay | Admitting: Internal Medicine

## 2016-12-31 NOTE — Telephone Encounter (Signed)
Returned call to patient. She states on 12/3 she took her first dose of losartan and she felt dizzy, nausea and very anxious. Patient is no longer taking losartan, today she c/o sob, "not feeling good". Patient states she cannot tolerate the losartan. Informed patient I would forward to Dr. Saunders Revel for further recommendations. Patient also requested some anti-anxiety medication. I advised patient to follow up with PCP. Patient verbalized understanding and thanked me for the call.

## 2016-12-31 NOTE — Telephone Encounter (Signed)
New message     Pt c/o medication issue:  1. Name of Medication:  losartan  2. How are you currently taking this medication (dosage and times per day)?  1/2 pill 3. Are you having a reaction (difficulty breathing--STAT)? Yes   4. What is your medication issue? Pt stopped taking this medication her bp shot up to 185/44   She would like Dr End to give her another recommendation, she will not take this medicine

## 2017-01-01 NOTE — Telephone Encounter (Signed)
Discussed with patient, she verbalized understanding, agreed with plan

## 2017-01-01 NOTE — Telephone Encounter (Signed)
If she is intolerant of losartan, she should hold this until she follows up with Dr. Lovena Le. He can reassess her dizziness at that time. She will need to discuss her anxiety and medication treatments for this with her PCP.  Nelva Bush, MD [facility] Pager: [phone]

## 2017-01-07 ENCOUNTER — Other Ambulatory Visit: Payer: Medicare HMO

## 2017-01-07 DIAGNOSIS — N3946 Mixed incontinence: Secondary | ICD-10-CM | POA: Diagnosis not present

## 2017-01-07 DIAGNOSIS — R351 Nocturia: Secondary | ICD-10-CM | POA: Diagnosis not present

## 2017-01-08 DIAGNOSIS — N3946 Mixed incontinence: Secondary | ICD-10-CM | POA: Diagnosis not present

## 2017-01-20 ENCOUNTER — Ambulatory Visit
Admission: RE | Admit: 2017-01-20 | Discharge: 2017-01-20 | Disposition: A | Discharge status: Home / Self Care | Payer: Medicare HMO | Source: Ambulatory Visit | Attending: Internal Medicine | Admitting: Internal Medicine

## 2017-01-20 DIAGNOSIS — Z1231 Encounter for screening mammogram for malignant neoplasm of breast: Secondary | ICD-10-CM

## 2017-01-22 DIAGNOSIS — R300 Dysuria: Secondary | ICD-10-CM | POA: Diagnosis not present

## 2017-01-22 DIAGNOSIS — N390 Urinary tract infection, site not specified: Secondary | ICD-10-CM | POA: Diagnosis not present

## 2017-02-02 ENCOUNTER — Encounter: Payer: Self-pay | Admitting: Obstetrics & Gynecology

## 2017-02-02 ENCOUNTER — Ambulatory Visit: Payer: Medicare HMO | Admitting: Obstetrics & Gynecology

## 2017-02-02 VITALS — BP 146/86 | HR 67 | Ht 65.0 in | Wt 151.0 lb

## 2017-02-02 DIAGNOSIS — N390 Urinary tract infection, site not specified: Secondary | ICD-10-CM | POA: Diagnosis not present

## 2017-02-02 MED ORDER — ESTRADIOL 10 MCG VA TABS: ORAL_TABLET | VAGINAL | 12 refills | Status: DC

## 2017-02-02 NOTE — Progress Notes (Signed)
Patient ID: Alisha Brown, female   DOB: 1940-06-17, 77 y.o.   MRN: [id number]  Chief Complaint  Patient presents with  . Gynecologic Exam    HPI Alisha Brown is a 77 y.o. female. She was on premarin for at least 20 years but developed some visual changes and her gyn stopped her premarin. 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  Her main issue today is hot flashes, dizziness, and recurrent UTIs. She has seen a urolgoist who did a cystoscopy, ultrasounds... All appeared normal. Another issue is vaginal/vulvar burning and itching.   She is concerned about some vaginal pressure and "prolapse". She reports normal but painful BMs.   HPI  Past Medical History:  Diagnosis Date  . Allergy   . Anemia   . Anxiety   . Arrhythmia    heart  . CHF (congestive heart failure) ([place])   . Diverticulosis of colon (without mention of hemorrhage)   . GERD with stricture   . OHYWVPXT(062.6)    consult dr Tomma Rakers visual migraine?  agg by HRT?  [name] Heart murmur   . Hiatal hernia   . Hyperlipidemia   . Hypertension   . Mitral valve disorder   . PVC (premature ventricular contraction)   . Seizures ([place])     Past Surgical History:  Procedure Laterality Date  . ABDOMINAL HYSTERECTOMY  1987  . ADENOIDECTOMY    . APPENDECTOMY  1987  . BREAST EXCISIONAL BIOPSY    . BREAST SURGERY Right 1990   biopsy,benign  . CARDIAC CATHETERIZATION  2009   No CAD. LVEF 50%.  . COLONOSCOPY    . ESOPHAGOGASTRODUODENOSCOPY    . FOOT SURGERY Right 2011  . RIGHT/LEFT HEART CATH AND CORONARY ANGIOGRAPHY N/A 12/10/2016   Procedure: RIGHT/LEFT HEART CATH AND CORONARY ANGIOGRAPHY;  Surgeon: Nelva Bush, MD;  Location: [facility];  Service: Cardiovascular;  Laterality: N/A;  . TONSILLECTOMY  1953  . TUBAL LIGATION      Family History  Problem Relation Age of Onset  . Diabetes Mother   . Glaucoma Mother   . Diabetes Son   . Diabetes Sister         1/2 sister-same mom  . Heart murmur Sister   . Arrhythmia Sister   . Breast cancer Maternal Grandmother   . Hypertension Brother   . Heart murmur Brother   . Arrhythmia Brother   . Diabetes Brother   . Colon cancer Neg Hx     Social History Social History   Tobacco Use  . Smoking status: Never Smoker  . Smokeless tobacco: Never Used  Substance Use Topics  . Alcohol use: No  . Drug use: No    Allergies  Allergen Reactions  . Contrast Media [Iodinated Diagnostic Agents] Hives    Hives/nausea  . Aleve [Naproxen Sodium] Hives  . Augmentin [Amoxicillin-Pot Clavulanate] Diarrhea and Nausea And Vomiting    Has patient had a PCN reaction causing immediate rash, facial/tongue/throat swelling, SOB or lightheadedness with hypotension: No Has patient had a PCN reaction causing severe rash involving mucus membranes or skin necrosis: No Has patient had a PCN reaction that required hospitalization: No Has patient had a PCN reaction occurring within the last 10 years: No--nausea/vomitng ONLY (pt believes dosage was too high) If all of the above answers are "NO", then may proceed with Cephalosporin use.   . Codeine Nausea And Vomiting and Other (See Comments)  Dizziness.  . Gabapentin Other (See Comments)    pseudo seizures.  Alisha Brown Hives  . Propoxyphene N-Acetaminophen     REACTION: hives,dizzy,gi upset    Current Outpatient Medications  Medication Sig Dispense Refill  . aspirin EC 81 MG tablet Take 1 tablet (81 mg total) by mouth daily.    . carvedilol (COREG) 3.125 MG tablet Take 3.125 mg by mouth 2 (two) times daily with a meal.    . cholecalciferol (VITAMIN D) 1000 units tablet Take 1,000 Units every other day by mouth.    . Magnesium 500 MG CAPS Take 500 mg daily by mouth.    . ranitidine (ZANTAC) 75 MG tablet Take 75 mg 2 (two) times daily by mouth.    . vitamin B-12 (CYANOCOBALAMIN) 1000 MCG tablet Take 1,000 mcg every other day by mouth.    . cetirizine  (ZYRTEC) 10 MG tablet Take 1 tablet (10 mg total) by mouth daily. (Patient not taking: Reported on 02/02/2017) 30 tablet 0  . Flaxseed, Linseed, (FLAXSEED OIL) 1000 MG CAPS Take 1,000 mg daily by mouth.    . hydrOXYzine (ATARAX/VISTARIL) 10 MG tablet Take 1-2 tables (10-20mg ) at night for itching rash (Patient not taking: Reported on 02/02/2017) 10 tablet 0  . ketotifen (ZADITOR) 0.025 % ophthalmic solution Place 1-2 drops 2 (two) times daily as needed into both eyes (for itchy eye).    . LORazepam (ATIVAN) 0.5 MG tablet Take 1 tablet (0.5 mg total) by mouth 2 (two) times daily as needed for anxiety. (Patient not taking: Reported on 02/02/2017) 24 tablet 0  . Methylsulfonylmethane (MSM) 1000 MG CAPS Take 1,000 mg daily by mouth.    . mirtazapine (REMERON) 15 MG tablet Take 1 tablet (15 mg total) by mouth at bedtime as needed. (Patient not taking: Reported on 02/02/2017) 30 tablet 1  . nitrofurantoin (MACRODANTIN) 50 MG capsule Take 50 mg at bedtime by mouth.     . Ophthalmic Irrigation Solution (RA STERILE EYE [place] OP) Place 1 application daily into both eyes.     No current facility-administered medications for this visit.     Review of Systems Review of Systems  Blood pressure (!) 146/86, pulse 67, height 5\' 5"  (1.651 m), weight 151 lb (68.5 kg).  Physical Exam Physical Exam Breathing, conversing, and ambulating normally Well nourished, well hydrated White female, no apparent distress Severe vulvovaginal atrophy No cystocele, 2nd degree rectocele Normal anus  Data Reviewed RECOMMENDATION: Screening mammogram in one year. (Code:SM-B-01Y)  BI-RADS CATEGORY  1: Negative.   Electronically Signed   By: Alisha Brown M.D.   On: 01/20/2017 11:33   Assessment    Recurrent UTIs, vulvar burning and itching- due to severe atrophy Vagifem 3 times per week prescribed Check uc&s Hot flashes- I offered watchful waiting, SSRIs, OTC meds, versus restarting systemic estrogen with the known risk  of CVA (bad idea)    Plan    See above       Alisha Brown Alisha Brown 02/02/2017, 1:31 PM

## 2017-02-03 ENCOUNTER — Telehealth: Payer: Self-pay | Admitting: *Deleted

## 2017-02-03 MED ORDER — ESTRADIOL 0.1 MG/GM VA CREA: 1.0000 | TOPICAL_CREAM | VAGINAL | 12 refills | Status: DC

## 2017-02-03 NOTE — Telephone Encounter (Signed)
Pt called stating that the Vagifem was 400.00 dollars and would like to try something cheaper.  Per Dr Hulan Fray recommend Estrace cream.  This was sent to [facility] and I advised pt to go on line a search for a discount card for Estrace.

## 2017-02-04 LAB — URINE CULTURE
MICRO NUMBER: 90030076
RESULT: NO GROWTH
SPECIMEN QUALITY: ADEQUATE

## 2017-02-10 ENCOUNTER — Encounter: Payer: Medicare HMO | Admitting: Obstetrics & Gynecology

## 2017-02-16 ENCOUNTER — Ambulatory Visit: Payer: Medicare HMO | Admitting: Internal Medicine

## 2017-02-16 ENCOUNTER — Encounter: Payer: Self-pay | Admitting: Internal Medicine

## 2017-02-16 VITALS — BP 136/64 | HR 68 | Resp 16 | Ht 65.0 in | Wt 150.4 lb

## 2017-02-16 DIAGNOSIS — I493 Ventricular premature depolarization: Secondary | ICD-10-CM

## 2017-02-16 DIAGNOSIS — R002 Palpitations: Secondary | ICD-10-CM | POA: Diagnosis not present

## 2017-02-16 MED ORDER — CARVEDILOL 3.125 MG PO TABS: ORAL_TABLET | ORAL | 3 refills | Status: DC

## 2017-02-16 NOTE — Patient Instructions (Addendum)
Medication Instructions:  Your physician has recommended you make the following change in your medication:    Carvedilol    1. Take one tab (3.125mg ) in the morning and two tabs (6.25mg ) in the evening by mouth                  for 4 weeks  2. Starting on week 5, begin to take two tabs (6.25mg ) by mouth in the morning and in the                 evening.  Labwork: None ordered.  Testing/Procedures: None ordered.  Follow-Up: Your physician wants you to follow-up in: 3 months with Dr Lovena Le.    Any Other Special Instructions Will Be Listed Below (If Applicable).     If you need a refill on your cardiac medications before your next appointment, please call your pharmacy.

## 2017-02-16 NOTE — Progress Notes (Signed)
HPI Alisha Brown returns today for ongoing evaluation and management of her PVC's. She was seen by me 2 months ago and we started her on coreg and her dizziness has improved. She still has palpitations but feels better. Her blood pressure has been a little high. No syncope. Her EF was 40%.  Allergies  Allergen Reactions  . Contrast Media [Iodinated Diagnostic Agents] Hives    Hives/nausea  . Aleve [Naproxen Sodium] Hives  . Augmentin [Amoxicillin-Pot Clavulanate] Diarrhea and Nausea And Vomiting    Has patient had a PCN reaction causing immediate rash, facial/tongue/throat swelling, SOB or lightheadedness with hypotension: No Has patient had a PCN reaction causing severe rash involving mucus membranes or skin necrosis: No Has patient had a PCN reaction that required hospitalization: No Has patient had a PCN reaction occurring within the last 10 years: No--nausea/vomitng ONLY (pt believes dosage was too high) If all of the above answers are "NO", then may proceed with Cephalosporin use.   . Codeine Nausea And Vomiting and Other (See Comments)    Dizziness.  . Gabapentin Other (See Comments)    pseudo seizures.  [name] Hives  . Propoxyphene N-Acetaminophen     REACTION: hives,dizzy,gi upset     Current Outpatient Medications  Medication Sig Dispense Refill  . cholecalciferol (VITAMIN D) 1000 units tablet Take 1,000 Units every other day by mouth.    . Flaxseed, Linseed, (FLAXSEED OIL) 1000 MG CAPS Take 1,000 mg daily by mouth.    [name] ketotifen (ZADITOR) 0.025 % ophthalmic solution Place 1-2 drops 2 (two) times daily as needed into both eyes (for itchy eye).    . Magnesium 500 MG CAPS Take 500 mg daily by mouth.    . Methylsulfonylmethane (MSM) 1000 MG CAPS Take 1,000 mg daily by mouth.    . Ophthalmic Irrigation Solution (RA STERILE EYE [place] OP) Place 1 application daily into both eyes.    . ranitidine (ZANTAC) 75 MG tablet Take 75 mg 2 (two) times daily by mouth.    .  vitamin B-12 (CYANOCOBALAMIN) 1000 MCG tablet Take 1,000 mcg every other day by mouth.    . carvedilol (COREG) 3.125 MG tablet Take one tab in AM and two tab in PM q4wks, then increase to 2 tabs bid. 180 tablet 3   No current facility-administered medications for this visit.      Past Medical History:  Diagnosis Date  . Allergy   . Anemia   . Anxiety   . Arrhythmia    heart  . CHF (congestive heart failure) ([place])   . Diverticulosis of colon (without mention of hemorrhage)   . GERD with stricture   . QQVZDGLO(756.4)    consult dr Tomma Rakers visual migraine?  agg by HRT?  [name] Heart murmur   . Hiatal hernia   . Hyperlipidemia   . Hypertension   . Mitral valve disorder   . PVC (premature ventricular contraction)   . Seizures ([place])     ROS:   All systems reviewed and negative except as noted in the HPI.   Past Surgical History:  Procedure Laterality Date  . ABDOMINAL HYSTERECTOMY  1987  . ADENOIDECTOMY    . APPENDECTOMY  1987  . BREAST EXCISIONAL BIOPSY    . BREAST SURGERY Right 1990   biopsy,benign  . CARDIAC CATHETERIZATION  2009   No CAD. LVEF 50%.  . COLONOSCOPY    . ESOPHAGOGASTRODUODENOSCOPY    . FOOT SURGERY Right 2011  . RIGHT/LEFT HEART  CATH AND CORONARY ANGIOGRAPHY N/A 12/10/2016   Procedure: RIGHT/LEFT HEART CATH AND CORONARY ANGIOGRAPHY;  Surgeon: Nelva Bush, MD;  Location: [facility];  Service: Cardiovascular;  Laterality: N/A;  . TONSILLECTOMY  1953  . TUBAL LIGATION       Family History  Problem Relation Age of Onset  . Diabetes Mother   . Glaucoma Mother   . Diabetes Son   . Diabetes Sister        1/2 sister-same mom  . Heart murmur Sister   . Arrhythmia Sister   . Breast cancer Maternal Grandmother   . Hypertension Brother   . Heart murmur Brother   . Arrhythmia Brother   . Diabetes Brother   . Colon cancer Neg Hx      Social History   Socioeconomic History  . Marital status: Married    Spouse name: Not on file  . Number of  children: 1  . Years of education: Not on file  . Highest education level: Not on file  Social Needs  . Financial resource strain: Not on file  . Food insecurity - worry: Not on file  . Food insecurity - inability: Not on file  . Transportation needs - medical: Not on file  . Transportation needs - non-medical: Not on file  Occupational History  . Occupation: retired  Tobacco Use  . Smoking status: Never Smoker  . Smokeless tobacco: Never Used  Substance and Sexual Activity  . Alcohol use: No  . Drug use: No  . Sexual activity: Not on file  Other Topics Concern  . Not on file  Social History Narrative   HHof 2 dog    Just  Died   [name]    Retired and married   [name]      Dog with health issues  Cooks for him bladder stones     BP 136/64   Pulse 68   Resp 16   Ht 5\' 5"  (1.651 m)   Wt 150 lb 6.4 oz (68.2 kg)   SpO2 98%   BMI 25.03 kg/m   Physical Exam:  Well appearing 77 yo woman, NAD HEENT: Unremarkable Neck:  No JVD, no thyromegally Lymphatics:  No adenopathy Back:  No CVA tenderness Lungs:  Clear with no wheezes HEART:  Regular rate rhythm, no murmurs, no rubs, no clicks Abd:  soft, positive bowel sounds, no organomegally, no rebound, no guarding Ext:  2 plus pulses, no edema, no cyanosis, no clubbing Skin:  No rashes no nodules Neuro:  CN II through XII intact, motor grossly intact  EKG - NSR with no PVC's   Assess/Plan: 1. PVC's - she is better though still not back to normal. I have recommended a very gradual uptitration of her coreg. I will see her back in 3 months to reassess her progress. 2. HTN - her blood pressure is a little high today. She will maintain a low sodium diet. 3. LV dysfunction - her symptoms are class 1. She will continue her current meds except we will uptitrate her coreg. I would anticipate repeating her echo once her meds have been further optimized.  Mikle Bosworth.D.

## 2017-03-01 ENCOUNTER — Encounter: Payer: Self-pay | Admitting: Obstetrics & Gynecology

## 2017-03-01 ENCOUNTER — Ambulatory Visit: Payer: Medicare HMO | Admitting: Obstetrics & Gynecology

## 2017-03-01 VITALS — BP 140/79 | HR 73 | Resp 16 | Ht 65.0 in | Wt 149.0 lb

## 2017-03-01 DIAGNOSIS — Z23 Encounter for immunization: Secondary | ICD-10-CM

## 2017-03-01 DIAGNOSIS — N905 Atrophy of vulva: Secondary | ICD-10-CM

## 2017-03-01 NOTE — Progress Notes (Signed)
   Subjective:    Patient ID: Alisha Brown, female    DOB: 1940/08/31, 77 y.o.   MRN: [id number]  HPI 77 yo here for followup of vulvar itching. I diagnosed her with severe atrophy and prescribed vagifem but it was too expensive, so then she was prescribed vaginal cream. She did not get it filled due to the concern of side effects. She changed her feminine wash from vagisil to Burnell Blanks and her brand of toilet paper to [employer]. She reports that the itching is better (95% gone).   Her issue today is "either incontinence or maybe the start of a UTI". Her symptom is burning, and "I can feel the moisture in my panties". Not wet, just damp, no odor. But burning of the vulva by the end of the day.   Review of Systems     Objective:   Physical Exam Breathing, conversing, and ambulating normally Well nourished, well hydrated White female, no apparent distress Her vulva is still with severe atrophy, no obvious loss of urine (She has a urologist) Urinalysis negative dip today     Assessment & Plan:  Severe vulvar atrophy, symptomatic I reassured her about the side effects of vulvar dosing of estrogen Come back 4 weeks for re check Flu vaccine

## 2017-03-12 DIAGNOSIS — N3946 Mixed incontinence: Secondary | ICD-10-CM | POA: Diagnosis not present

## 2017-03-12 DIAGNOSIS — N3000 Acute cystitis without hematuria: Secondary | ICD-10-CM | POA: Diagnosis not present

## 2017-03-25 ENCOUNTER — Encounter: Payer: Self-pay | Admitting: Internal Medicine

## 2017-03-25 ENCOUNTER — Ambulatory Visit: Payer: Medicare HMO | Admitting: Internal Medicine

## 2017-03-25 VITALS — BP 120/74 | HR 74 | Ht 65.0 in | Wt 150.4 lb

## 2017-03-25 DIAGNOSIS — I428 Other cardiomyopathies: Secondary | ICD-10-CM | POA: Diagnosis not present

## 2017-03-25 DIAGNOSIS — I493 Ventricular premature depolarization: Secondary | ICD-10-CM

## 2017-03-25 DIAGNOSIS — I472 Ventricular tachycardia: Secondary | ICD-10-CM

## 2017-03-25 DIAGNOSIS — I4729 Other ventricular tachycardia: Secondary | ICD-10-CM

## 2017-03-25 NOTE — Progress Notes (Signed)
Follow-up Outpatient Visit Date: 03/25/2017  Primary Care Provider: Burnis Medin, MD [place] [address]  Chief Complaint: Follow-up cardiomyopathy  HPI:  Alisha Brown is a 77 y.o. year-old female with history of nonischemic cardiomyopathy, frequent PVCs and nonsustained ventricular tachycardia, hypertension, and hyperlipidemia, who presents for follow-up of cardiomyopathy and ventricular arrhythmias. I last saw her in late November, at which time Alisha Brown reported frequent chest pain that has been present for years. Catheterization earlier in November showed no evidence of significant CAD. LVEF was severely reduced at 30-35%. I advised her to begin taking losartan, though she did not feel well after starting the medication. She noted improved dizziness when she was seen for follow-up by Dr. Lovena Le last month. He recommended gradual uptitration of carvedilol.  However, after increasing carvedilol to 3.125 mg every morning and 6.25 mg every afternoon, Alisha Brown felt dizzy, short of breath, and also noted increasing chest pressure.  She had been tolerating 1-1/2 tablets at bedtime okay.  Today, overall Alisha Brown feels well.  She still has some mild exertional dyspnea and palpitations but overall is feeling notably better compared to her last visit.  She has minimal chest pressure and shortness of breath.  She denies orthopnea, PND, and edema.  She would like to begin exercising a little bit.  She notes rare flutters in her chest.  --------------------------------------------------------------------------------------------------  Cardiovascular History & Procedures: Cardiovascular Problems:  Cardiomyopathy  PVCsand nonsustained ventricular tachycardia  Risk Factors:  Hypertension, hyperlipidemia, and age > 40  Cath/PCI:  LHC/RHC (12/10/16): No angiographically significant coronary artery disease.  Normal left and right heart filling pressures.  Normal  Fick cardiac output/index.  Severely reduced LV contraction (EF 30-35%).  LHC (03/30/07): LMCA normal. LAD normal. LCx normal. Dominant RCA normal. LVEF 50%.   CV Surgery:  None  EP Procedures and Devices:  48-hour Holter monitor (11/17/16): Normal sinus rhythm, sinus bradycardia, and sinus tachycardia.  Frequent PVCs and occasional NSVT.  Rare PACs.  No sustained arrhythmia.  Non-Invasive Evaluation(s):  Pharmacologic MPI (10/22/16): Low risk study with fixed basal and mid inferior/inferolateral defect that could represent artifact or scar.  No ischemia.  TTE (09/23/16): Normal LV size with LVEF 40-45% and diffuse hypokinesis. Grade 1 diastolic dysfunction. Trivial AI. Mild MR. Mild left atrial enlargement.  Recent CV Pertinent Labs: Lab Results  Component Value Date   CHOL 255 (H) 09/18/2016   HDL 70.70 09/18/2016   LDLCALC 168 (H) 09/18/2016   LDLDIRECT 169.1 05/30/2012   TRIG 84.0 09/18/2016   CHOLHDL 4 09/18/2016   INR 1.0 12/08/2016   K 4.4 12/08/2016   MG 2.2 05/30/2012   BUN 20 12/08/2016   CREATININE 0.90 12/08/2016    Past medical and surgical history were reviewed and updated in EPIC.  Current Meds  Medication Sig  . carvedilol (COREG) 3.125 MG tablet Take 3.125 mg by mouth 2 (two) times daily with a meal.  . cholecalciferol (VITAMIN D) 1000 units tablet Take 1,000 Units every other day by mouth.  . estradiol (ESTRACE) 0.1 MG/GM vaginal cream PLACE 1 APPLICATORFUL VAGINALLY 3 (THREE) TIMES A WEEK.  . Flaxseed, Linseed, (FLAXSEED OIL) 1000 MG CAPS Take 1,000 mg daily by mouth.  [name] ketotifen (ZADITOR) 0.025 % ophthalmic solution Place 1-2 drops 2 (two) times daily as needed into both eyes (for itchy eye).  . Magnesium 500 MG CAPS Take 500 mg daily by mouth.  . Methylsulfonylmethane (MSM) 1000 MG CAPS Take 1,000 mg daily by mouth.  [name]  Ophthalmic Irrigation Solution (RA STERILE EYE [place] OP) Place 1 application daily into both eyes.  . ranitidine (ZANTAC) 75 MG  tablet Take 75 mg 2 (two) times daily by mouth.  . vitamin B-12 (CYANOCOBALAMIN) 1000 MCG tablet Take 1,000 mcg every other day by mouth.    Allergies: Contrast media [iodinated diagnostic agents]; Aleve [naproxen sodium]; Augmentin [amoxicillin-pot clavulanate]; Codeine; Gabapentin; Krill oil; and Propoxyphene n-acetaminophen  Social History   Socioeconomic History  . Marital status: Married    Spouse name: Not on file  . Number of children: 1  . Years of education: Not on file  . Highest education level: Not on file  Social Needs  . Financial resource strain: Not on file  . Food insecurity - worry: Not on file  . Food insecurity - inability: Not on file  . Transportation needs - medical: Not on file  . Transportation needs - non-medical: Not on file  Occupational History  . Occupation: retired  Tobacco Use  . Smoking status: Never Smoker  . Smokeless tobacco: Never Used  Substance and Sexual Activity  . Alcohol use: No  . Drug use: No  . Sexual activity: Not Currently    Birth control/protection: None  Other Topics Concern  . Not on file  Social History Narrative   HHof 2 dog    Just  Died   Alisha Brown    Retired and married   [name]      Dog with health issues  Cooks for him bladder stones    Family History  Problem Relation Age of Onset  . Diabetes Mother   . Glaucoma Mother   . Diabetes Son   . Diabetes Sister        1/2 sister-same mom  . Heart murmur Sister   . Arrhythmia Sister   . Breast cancer Maternal Grandmother   . Hypertension Brother   . Heart murmur Brother   . Arrhythmia Brother   . Diabetes Brother   . Colon cancer Neg Hx     Review of Systems: A 12-system review of systems was performed and was negative except as noted in the HPI.  --------------------------------------------------------------------------------------------------  Physical Exam: BP 120/74   Pulse 74   Ht 5\' 5"  (1.651 m)   Wt 150 lb 6.4 oz (68.2 kg)   SpO2 98%   BMI 25.03  kg/m   General:  NAD. Pt accompanied by her husband. HEENT: No conjunctival pallor or scleral icterus. Moist mucous membranes.  OP clear. Neck: Supple without lymphadenopathy, thyromegaly, JVD, or HJR.  Lungs: Normal work of breathing. Clear to auscultation bilaterally without wheezes or crackles. Heart: Regular rate and rhythm with occasional extrasystoles.  No murmurs, rubs, or gallops. Non-displaced PMI. Abd: Bowel sounds present. Soft, NT/ND without hepatosplenomegaly Ext: No lower extremity edema. Radial, PT, and DP pulses are 2+ bilaterally. Skin: Warm and dry without rash.  EKG: Normal sinus rhythm with PVCs.  Poor R wave progression in V1 through V3 and nonspecific T wave abnormalities.  Lab Results  Component Value Date   WBC 6.5 12/08/2016   HGB 13.7 12/08/2016   HCT 40.5 12/08/2016   MCV 88 12/08/2016   PLT 242 12/08/2016    Lab Results  Component Value Date   NA 141 12/08/2016   K 4.4 12/08/2016   CL 101 12/08/2016   CO2 24 12/08/2016   BUN 20 12/08/2016   CREATININE 0.90 12/08/2016   GLUCOSE 91 12/08/2016   ALT 19 09/18/2016    Lab Results  Component Value Date   CHOL 255 (H) 09/18/2016   HDL 70.70 09/18/2016   LDLCALC 168 (H) 09/18/2016   LDLDIRECT 169.1 05/30/2012   TRIG 84.0 09/18/2016   CHOLHDL 4 09/18/2016    --------------------------------------------------------------------------------------------------  ASSESSMENT AND PLAN: Nonischemic cardiomyopathy Ms. Heizer appears euvolemic and well compensated with NYHA class II symptoms.  Unfortunately, she did not tolerate addition of low-dose losartan.  She was able to increase carvedilol slightly but was intolerant of 6.25 mg.  We have discussed trying an ACE inhibitor versus retrial of higher dose carvedilol; Ms. Keehan has asked for the latter.  We will therefore increase carvedilol to 1.5 tablets twice daily of the 3.125 mg strength.  We will forego adding an ACE inhibitor/ARB at this time.  PVCs  and nonsustained ventricular tachycardia Symptomatically, Ms. Proctor is doing well.  We will try to escalate her carvedilol gingerly, as above.  Ms. Towe should continue to follow with Dr. Lovena Le.  Follow-up: Return to clinic in 4 months.  Nelva Bush, MD 03/25/2017 11:21 AM

## 2017-03-25 NOTE — Patient Instructions (Addendum)
Medication Instructions:  INCREASE Carvedilol to 4.69 mg twice per day  1 and 1/2 tablets  -- If you need a refill on your cardiac medications before your next appointment, please call your pharmacy. --  Labwork: None ordered  Testing/Procedures: None ordered  Follow-Up: Your physician wants you to follow-up in: 4 months with Dr. Saunders Revel.    You will receive a reminder letter in the mail two months in advance. If you don't receive a letter, please call our office to schedule the follow-up appointment.  Thank you for choosing [facility]!!    Any Other Special Instructions Will Be Listed Below (If Applicable).

## 2017-04-01 ENCOUNTER — Encounter: Payer: Self-pay | Admitting: Obstetrics & Gynecology

## 2017-04-01 ENCOUNTER — Ambulatory Visit: Payer: Medicare HMO | Admitting: Obstetrics & Gynecology

## 2017-04-01 VITALS — BP 117/74 | HR 67 | Ht 65.0 in | Wt 150.0 lb

## 2017-04-01 DIAGNOSIS — N9489 Other specified conditions associated with female genital organs and menstrual cycle: Secondary | ICD-10-CM | POA: Diagnosis not present

## 2017-04-01 DIAGNOSIS — N905 Atrophy of vulva: Secondary | ICD-10-CM

## 2017-04-01 NOTE — Progress Notes (Signed)
Patient ID: Alisha Brown, female   DOB: 1940/07/29, 77 y.o.   MRN: [id number]  Chief Complaint  Patient presents with  . Follow-up    HPI Alisha Brown is a 77 y.o. female.   HPI She is here for follow up after starting compounded vaginal estrogen. She is using a very very small amount ("the size of my little fingernail") daily. She has noticed an improvement but still has some vulvar burning. She saw her urologist recently who told her that she doesn't need a follow up visit unless she develops a new problems. She saw her cardiologist who reports to her that her PVCs are dramatically decreased.  Past Medical History:  Diagnosis Date  . Allergy   . Anemia   . Anxiety   . Arrhythmia    heart  . CHF (congestive heart failure) ([place])   . Diverticulosis of colon (without mention of hemorrhage)   . GERD with stricture   . WNUUVOZD(664.4)    consult dr Tomma Rakers visual migraine?  agg by HRT?  [name] Heart murmur   . Hiatal hernia   . Hyperlipidemia   . Hypertension   . Mitral valve disorder   . PVC (premature ventricular contraction)   . Seizures ([place])     Past Surgical History:  Procedure Laterality Date  . ABDOMINAL HYSTERECTOMY  1987  . ADENOIDECTOMY    . APPENDECTOMY  1987  . BREAST EXCISIONAL BIOPSY    . BREAST SURGERY Right 1990   biopsy,benign  . CARDIAC CATHETERIZATION  2009   No CAD. LVEF 50%.  . COLONOSCOPY    . ESOPHAGOGASTRODUODENOSCOPY    . FOOT SURGERY Right 2011  . RIGHT/LEFT HEART CATH AND CORONARY ANGIOGRAPHY N/A 12/10/2016   Procedure: RIGHT/LEFT HEART CATH AND CORONARY ANGIOGRAPHY;  Surgeon: Nelva Bush, MD;  Location: [facility];  Service: Cardiovascular;  Laterality: N/A;  . TONSILLECTOMY  1953  . TUBAL LIGATION      Family History  Problem Relation Age of Onset  . Diabetes Mother   . Glaucoma Mother   . Diabetes Son   . Diabetes Sister        1/2 sister-same mom  . Heart murmur Sister   . Arrhythmia Sister   . Breast cancer Maternal  Grandmother   . Hypertension Brother   . Heart murmur Brother   . Arrhythmia Brother   . Diabetes Brother   . Colon cancer Neg Hx     Social History Social History   Tobacco Use  . Smoking status: Never Smoker  . Smokeless tobacco: Never Used  Substance Use Topics  . Alcohol use: No  . Drug use: No    Allergies  Allergen Reactions  . Contrast Media [Iodinated Diagnostic Agents] Hives    Hives/nausea  . Aleve [Naproxen Sodium] Hives  . Augmentin [Amoxicillin-Pot Clavulanate] Diarrhea and Nausea And Vomiting    Has patient had a PCN reaction causing immediate rash, facial/tongue/throat swelling, SOB or lightheadedness with hypotension: No Has patient had a PCN reaction causing severe rash involving mucus membranes or skin necrosis: No Has patient had a PCN reaction that required hospitalization: No Has patient had a PCN reaction occurring within the last 10 years: No--nausea/vomitng ONLY (pt believes dosage was too high) If all of the above answers are "NO", then may proceed with Cephalosporin use.   . Codeine Nausea And Vomiting and Other (See Comments)    Dizziness.  . Gabapentin Other (See Comments)    pseudo seizures.  Javier Docker  Oil Hives  . Propoxyphene N-Acetaminophen     REACTION: hives,dizzy,gi upset    Current Outpatient Medications  Medication Sig Dispense Refill  . carvedilol (COREG) 3.125 MG tablet Take 3.125 mg by mouth 3 (three) times daily with meals.     . cholecalciferol (VITAMIN D) 1000 units tablet Take 1,000 Units every other day by mouth.    . estradiol (ESTRACE) 0.1 MG/GM vaginal cream PLACE 1 APPLICATORFUL VAGINALLY 3 (THREE) TIMES A WEEK.  12  . Flaxseed, Linseed, (FLAXSEED OIL) 1000 MG CAPS Take 1,000 mg daily by mouth.    [name] ketotifen (ZADITOR) 0.025 % ophthalmic solution Place 1-2 drops 2 (two) times daily as needed into both eyes (for itchy eye).    . Magnesium 500 MG CAPS Take 500 mg daily by mouth.    . Methylsulfonylmethane (MSM) 1000 MG CAPS  Take 1,000 mg daily by mouth.    . Ophthalmic Irrigation Solution (RA STERILE EYE [place] OP) Place 1 application daily into both eyes.    . ranitidine (ZANTAC) 75 MG tablet Take 75 mg 2 (two) times daily by mouth.    . vitamin B-12 (CYANOCOBALAMIN) 1000 MCG tablet Take 1,000 mcg every other day by mouth.     No current facility-administered medications for this visit.     Review of Systems Review of Systems  Blood pressure 117/74, pulse 67, height 5\' 5"  (1.651 m), weight 150 lb (68 kg).  Physical Exam Physical Exam Breathing, conversing, and ambulating normally Well nourished, well hydrated White female, no apparent distress Her vulva/vagina are still some atrophic, but a dramatic improvement. Data Reviewed   Assessment    vulvar burning- improved but still present. I have suggested that she use a teaspoon of the compounded estrogen QOD versus 1/2 teaspoon daily. I have given her a sample of Rephresh to be used prn.  Come back 4 weeks for follow up  Plan    See above       [name] 04/01/2017, 4:25 PM

## 2017-04-29 ENCOUNTER — Encounter: Payer: Self-pay | Admitting: Obstetrics & Gynecology

## 2017-04-29 ENCOUNTER — Other Ambulatory Visit (HOSPITAL_COMMUNITY)
Admission: RE | Admit: 2017-04-29 | Discharge: 2017-04-29 | Disposition: A | Discharge status: Home / Self Care | Payer: Medicare HMO | Source: Ambulatory Visit | Attending: Obstetrics & Gynecology | Admitting: Obstetrics & Gynecology

## 2017-04-29 ENCOUNTER — Ambulatory Visit: Payer: Medicare HMO | Admitting: Obstetrics & Gynecology

## 2017-04-29 VITALS — BP 130/77 | HR 85 | Wt 151.0 lb

## 2017-04-29 DIAGNOSIS — N9489 Other specified conditions associated with female genital organs and menstrual cycle: Secondary | ICD-10-CM | POA: Diagnosis not present

## 2017-04-29 DIAGNOSIS — N898 Other specified noninflammatory disorders of vagina: Secondary | ICD-10-CM

## 2017-04-29 NOTE — Progress Notes (Signed)
Patient ID: Alisha Brown, female   DOB: 1940/03/21, 77 y.o.   MRN: [id number]  Chief Complaint  Patient presents with  . Follow-up    HPI Alisha Brown is a 77 y.o. female.  Married lady here with a long history of vulvar discomfort. She has increased her vulvar application of compounded estrogen since her last visit. She notes that when washing it finally feels "normal". But she has also experienced some intense burning. She stopped the estrogen recently when she started having pelvic cramping and a vaginal discharge. She has been bathing frequently due to the discharge.  HPI  Past Medical History:  Diagnosis Date  . Allergy   . Anemia   . Anxiety   . Arrhythmia    heart  . CHF (congestive heart failure) ([place])   . Diverticulosis of colon (without mention of hemorrhage)   . GERD with stricture   . OINOMVEH(209.4)    consult dr Tomma Rakers visual migraine?  agg by HRT?  [name] Heart murmur   . Hiatal hernia   . Hyperlipidemia   . Hypertension   . Mitral valve disorder   . PVC (premature ventricular contraction)   . Seizures ([place])     Past Surgical History:  Procedure Laterality Date  . ABDOMINAL HYSTERECTOMY  1987  . ADENOIDECTOMY    . APPENDECTOMY  1987  . BREAST EXCISIONAL BIOPSY    . BREAST SURGERY Right 1990   biopsy,benign  . CARDIAC CATHETERIZATION  2009   No CAD. LVEF 50%.  . COLONOSCOPY    . ESOPHAGOGASTRODUODENOSCOPY    . FOOT SURGERY Right 2011  . RIGHT/LEFT HEART CATH AND CORONARY ANGIOGRAPHY N/A 12/10/2016   Procedure: RIGHT/LEFT HEART CATH AND CORONARY ANGIOGRAPHY;  Surgeon: Nelva Bush, MD;  Location: [facility];  Service: Cardiovascular;  Laterality: N/A;  . TONSILLECTOMY  1953  . TUBAL LIGATION      Family History  Problem Relation Age of Onset  . Diabetes Mother   . Glaucoma Mother   . Diabetes Son   . Diabetes Sister        1/2 sister-same mom  . Heart murmur Sister   . Arrhythmia Sister   . Breast cancer Maternal Grandmother   .  Hypertension Brother   . Heart murmur Brother   . Arrhythmia Brother   . Diabetes Brother   . Colon cancer Neg Hx     Social History Social History   Tobacco Use  . Smoking status: Never Smoker  . Smokeless tobacco: Never Used  Substance Use Topics  . Alcohol use: No  . Drug use: No    Allergies  Allergen Reactions  . Contrast Media [Iodinated Diagnostic Agents] Hives    Hives/nausea  . Aleve [Naproxen Sodium] Hives  . Augmentin [Amoxicillin-Pot Clavulanate] Diarrhea and Nausea And Vomiting    Has patient had a PCN reaction causing immediate rash, facial/tongue/throat swelling, SOB or lightheadedness with hypotension: No Has patient had a PCN reaction causing severe rash involving mucus membranes or skin necrosis: No Has patient had a PCN reaction that required hospitalization: No Has patient had a PCN reaction occurring within the last 10 years: No--nausea/vomitng ONLY (pt believes dosage was too high) If all of the above answers are "NO", then may proceed with Cephalosporin use.   . Codeine Nausea And Vomiting and Other (See Comments)    Dizziness.  . Gabapentin Other (See Comments)    pseudo seizures.  [name] Hives  . Propoxyphene N-Acetaminophen  REACTION: hives,dizzy,gi upset    Current Outpatient Medications  Medication Sig Dispense Refill  . carvedilol (COREG) 3.125 MG tablet Take 3.125 mg by mouth 3 (three) times daily with meals.     . cholecalciferol (VITAMIN D) 1000 units tablet Take 1,000 Units every other day by mouth.    . Flaxseed, Linseed, (FLAXSEED OIL) 1000 MG CAPS Take 1,000 mg daily by mouth.    [name] ketotifen (ZADITOR) 0.025 % ophthalmic solution Place 1-2 drops 2 (two) times daily as needed into both eyes (for itchy eye).    . Magnesium 500 MG CAPS Take 500 mg daily by mouth.    . Methylsulfonylmethane (MSM) 1000 MG CAPS Take 1,000 mg daily by mouth.    . Ophthalmic Irrigation Solution (RA STERILE EYE [place] OP) Place 1 application daily into  both eyes.    . ranitidine (ZANTAC) 75 MG tablet Take 75 mg 2 (two) times daily by mouth.    . vitamin B-12 (CYANOCOBALAMIN) 1000 MCG tablet Take 1,000 mcg every other day by mouth.    . estradiol (ESTRACE) 0.1 MG/GM vaginal cream PLACE 1 APPLICATORFUL VAGINALLY 3 (THREE) TIMES A WEEK.  12   No current facility-administered medications for this visit.     Review of Systems Review of Systems  Blood pressure 130/77, pulse 85, weight 151 lb (68.5 kg).  Physical Exam Physical Exam Breathing, conversing, and ambulating normally Well nourished, well hydrated White female, no apparent distress Her vulvar appears to have only minimal atrophy (big improvement)  Data Reviewed   Assessment    Vulvar burning- I have rec'd that she restart the daily vulvar estrogen but only a small amount (pea size amount) I have suggested adding either Replens (sample given at last visit) versus trial of CBD oil.    Plan    See above Come back 4 weeks       [name] 04/29/2017, 2:51 PM

## 2017-04-30 LAB — CERVICOVAGINAL ANCILLARY ONLY
Bacterial vaginitis: NEGATIVE
Candida vaginitis: NEGATIVE

## 2017-05-12 DIAGNOSIS — D2371 Other benign neoplasm of skin of right lower limb, including hip: Secondary | ICD-10-CM | POA: Diagnosis not present

## 2017-05-12 DIAGNOSIS — L820 Inflamed seborrheic keratosis: Secondary | ICD-10-CM | POA: Diagnosis not present

## 2017-05-12 DIAGNOSIS — D485 Neoplasm of uncertain behavior of skin: Secondary | ICD-10-CM | POA: Diagnosis not present

## 2017-05-12 DIAGNOSIS — D2271 Melanocytic nevi of right lower limb, including hip: Secondary | ICD-10-CM | POA: Diagnosis not present

## 2017-05-12 DIAGNOSIS — L821 Other seborrheic keratosis: Secondary | ICD-10-CM | POA: Diagnosis not present

## 2017-05-21 ENCOUNTER — Encounter: Payer: Self-pay | Admitting: Internal Medicine

## 2017-05-21 ENCOUNTER — Ambulatory Visit: Payer: Medicare HMO | Admitting: Internal Medicine

## 2017-05-21 VITALS — BP 120/74 | HR 64 | Ht 65.0 in | Wt 149.0 lb

## 2017-05-21 DIAGNOSIS — I519 Heart disease, unspecified: Secondary | ICD-10-CM

## 2017-05-21 DIAGNOSIS — I10 Essential (primary) hypertension: Secondary | ICD-10-CM

## 2017-05-21 DIAGNOSIS — I493 Ventricular premature depolarization: Secondary | ICD-10-CM

## 2017-05-21 NOTE — Patient Instructions (Addendum)

## 2017-05-21 NOTE — Progress Notes (Signed)
HPI [name] returns today for ongoing evaluation and management of her PVC's. She was seen by me 2 months ago and we started her on coreg and her dizziness has improved. She still has palpitations but feels better. Her blood pressure has been a little high. No syncope. Her EF was 40%. she has managed to increase the dose of her coreg and she is now taking 6.25 mg twice daily. She still has some occaisional palpitations.  Allergies  Allergen Reactions  . Contrast Media [Iodinated Diagnostic Agents] Hives    Hives/nausea  . Aleve [Naproxen Sodium] Hives  . Augmentin [Amoxicillin-Pot Clavulanate] Diarrhea and Nausea And Vomiting    Has patient had a PCN reaction causing immediate rash, facial/tongue/throat swelling, SOB or lightheadedness with hypotension: No Has patient had a PCN reaction causing severe rash involving mucus membranes or skin necrosis: No Has patient had a PCN reaction that required hospitalization: No Has patient had a PCN reaction occurring within the last 10 years: No--nausea/vomitng ONLY (pt believes dosage was too high) If all of the above answers are "NO", then may proceed with Cephalosporin use.   . Codeine Nausea And Vomiting and Other (See Comments)    Dizziness.  . Gabapentin Other (See Comments)    pseudo seizures.  Alisha Brown Hives  . Propoxyphene N-Acetaminophen     REACTION: hives,dizzy,gi upset     Current Outpatient Medications  Medication Sig Dispense Refill  . carvedilol (COREG) 3.125 MG tablet Take 3.125 mg by mouth 3 (three) times daily with meals.     . cholecalciferol (VITAMIN D) 1000 units tablet Take 1,000 Units every other day by mouth.    . estradiol (ESTRACE) 0.1 MG/GM vaginal cream PLACE 1 APPLICATORFUL VAGINALLY 3 (THREE) TIMES A WEEK.  12  . ketotifen (ZADITOR) 0.025 % ophthalmic solution Place 1-2 drops 2 (two) times daily as needed into both eyes (for itchy eye).    . Magnesium 500 MG CAPS Take 500 mg daily by mouth.    .  Methylsulfonylmethane (MSM) 1000 MG CAPS Take 1,000 mg daily by mouth.    . Ophthalmic Irrigation Solution (RA STERILE EYE [place] OP) Place 1 application daily into both eyes.    . ranitidine (ZANTAC) 75 MG tablet Take 75 mg 2 (two) times daily by mouth.    . vitamin B-12 (CYANOCOBALAMIN) 1000 MCG tablet Take 1,000 mcg every other day by mouth.     No current facility-administered medications for this visit.      Past Medical History:  Diagnosis Date  . Allergy   . Anemia   . Anxiety   . Arrhythmia    heart  . CHF (congestive heart failure) (Alisha Brown)   . Diverticulosis of colon (without mention of hemorrhage)   . GERD with stricture   . KGMWNUUV(253.6)    consult dr Alisha Brown visual migraine?  agg by HRT?  [name] Heart murmur   . Hiatal hernia   . Hyperlipidemia   . Hypertension   . Mitral valve disorder   . PVC (premature ventricular contraction)   . Seizures (Alisha Brown)     ROS:   All systems reviewed and negative except as noted in the HPI.   Past Surgical History:  Procedure Laterality Date  . ABDOMINAL HYSTERECTOMY  1987  . ADENOIDECTOMY    . APPENDECTOMY  1987  . BREAST EXCISIONAL BIOPSY    . BREAST SURGERY Right 1990   biopsy,benign  . CARDIAC CATHETERIZATION  2009   No CAD. LVEF  50%.  [name] COLONOSCOPY    . ESOPHAGOGASTRODUODENOSCOPY    . FOOT SURGERY Right 2011  . RIGHT/LEFT HEART CATH AND CORONARY ANGIOGRAPHY N/A 12/10/2016   Procedure: RIGHT/LEFT HEART CATH AND CORONARY ANGIOGRAPHY;  Surgeon: Alisha Bush, MD;  Location: [facility];  Service: Cardiovascular;  Laterality: N/A;  . TONSILLECTOMY  1953  . TUBAL LIGATION       Family History  Problem Relation Age of Onset  . Diabetes Mother   . Glaucoma Mother   . Diabetes Son   . Diabetes Sister        1/2 sister-same mom  . Heart murmur Sister   . Arrhythmia Sister   . Breast cancer Maternal Grandmother   . Hypertension Brother   . Heart murmur Brother   . Arrhythmia Brother   . Diabetes Brother   . Colon  cancer Neg Hx      Social History   Socioeconomic History  . Marital status: Married    Spouse name: Not on file  . Number of children: 1  . Years of education: Not on file  . Highest education level: Not on file  Occupational History  . Occupation: retired  Scientific laboratory technician  . Financial resource strain: Not on file  . Food insecurity:    Worry: Not on file    Inability: Not on file  . Transportation needs:    Medical: Not on file    Non-medical: Not on file  Tobacco Use  . Smoking status: Never Smoker  . Smokeless tobacco: Never Used  Substance and Sexual Activity  . Alcohol use: No  . Drug use: No  . Sexual activity: Not Currently    Birth control/protection: None  Lifestyle  . Physical activity:    Days per week: Not on file    Minutes per session: Not on file  . Stress: Not on file  Relationships  . Social connections:    Talks on phone: Not on file    Gets together: Not on file    Attends religious service: Not on file    Active member of club or organization: Not on file    Attends meetings of clubs or organizations: Not on file    Relationship status: Not on file  . Intimate partner violence:    Fear of current or ex partner: Not on file    Emotionally abused: Not on file    Physically abused: Not on file    Forced sexual activity: Not on file  Other Topics Concern  . Not on file  Social History Narrative   HHof 2 dog    Just  Died   Alisha Brown    Retired and married   [name]      Dog with health issues  Cooks for him bladder stones     BP 120/74   Pulse 64   Ht 5\' 5"  (1.651 m)   Wt 149 lb (67.6 kg)   SpO2 96%   BMI 24.79 kg/m   Physical Exam:  Well appearing 77 yo woman, NAD HEENT: Unremarkable Neck:  6 cm JVD, no thyromegally Lymphatics:  No adenopathy Back:  No CVA tenderness Lungs:  Clear with no wheezes HEART:  Regular rate rhythm, no murmurs, no rubs, no clicks Abd:  soft, positive bowel sounds, no organomegally, no rebound, no  guarding Ext:  2 plus pulses, no edema, no cyanosis, no clubbing Skin:  No rashes no nodules Neuro:  CN II through XII intact, motor grossly intact  DEVICE  Normal device function.  See PaceArt for details.   Assess/Plan: 1. PVC's - her symptoms are well controlled. She will undergo watchful waiting. 2. Chronic systolic heart failure - she is class 1-2 and her EF was 35-40%. I would have her repeat the echo in several months when she comes back to see [place]. 3. Dizziness - her symptoms are likely multifactorial. Will follow.  Mikle Bosworth.D.

## 2017-05-27 ENCOUNTER — Ambulatory Visit: Payer: Medicare HMO | Admitting: Obstetrics & Gynecology

## 2017-05-27 ENCOUNTER — Encounter: Payer: Self-pay | Admitting: Obstetrics & Gynecology

## 2017-05-27 VITALS — BP 122/72 | HR 74 | Resp 16 | Ht 65.0 in | Wt 151.0 lb

## 2017-05-27 DIAGNOSIS — N9489 Other specified conditions associated with female genital organs and menstrual cycle: Secondary | ICD-10-CM | POA: Diagnosis not present

## 2017-05-27 DIAGNOSIS — N905 Atrophy of vulva: Secondary | ICD-10-CM

## 2017-06-07 NOTE — Progress Notes (Signed)
   Subjective:    Patient ID: Alisha Brown, female    DOB: February 21, 1940, 77 y.o.   MRN: [id number]  HPI 77 yo married woman here for follow up of long standing vulvar burning and irritation. She is finally feeling like it has reached a good place. She is using a small amount of vulvar estrogen mostly on a QOD basis.She is not sexually active for years.   Review of Systems     Objective:   Physical Exam  Breathing, conversing, and ambulating normally Well nourished, well hydrated White female, no apparent distress Her vulva appears normal, minimal atrophy     Assessment & Plan:  Vulvar irritation- currently stable Continue estrogen QOD/prn Come back 6 months

## 2017-07-15 ENCOUNTER — Ambulatory Visit: Payer: Medicare HMO | Admitting: Internal Medicine

## 2017-09-20 NOTE — Patient Instructions (Addendum)
Checking blood tests and  Thyroid tests today .  Get flu  vaccine  When available and shingrix when possible.    Health Maintenance, Female Adopting a healthy lifestyle and getting preventive care can go a long way to promote health and wellness. Talk with your health care provider about what schedule of regular examinations is right for you. This is a good chance for you to check in with your provider about disease prevention and staying healthy. In between checkups, there are plenty of things you can do on your own. Experts have done a lot of research about which lifestyle changes and preventive measures are most likely to keep you healthy. Ask your health care provider for more information. Weight and diet Eat a healthy diet  Be sure to include plenty of vegetables, fruits, low-fat dairy products, and lean protein.  Do not eat a lot of foods high in solid fats, added sugars, or salt.  Get regular exercise. This is one of the most important things you can do for your health. ? Most adults should exercise for at least 150 minutes each week. The exercise should increase your heart rate and make you sweat (moderate-intensity exercise). ? Most adults should also do strengthening exercises at least twice a week. This is in addition to the moderate-intensity exercise.  Maintain a healthy weight  Body mass index (BMI) is a measurement that can be used to identify possible weight problems. It estimates body fat based on height and weight. Your health care provider can help determine your BMI and help you achieve or maintain a healthy weight.  For females 32 years of age and older: ? A BMI below 18.5 is considered underweight. ? A BMI of 18.5 to 24.9 is normal. ? A BMI of 25 to 29.9 is considered overweight. ? A BMI of 30 and above is considered obese.  Watch levels of cholesterol and blood lipids  You should start having your blood tested for lipids and cholesterol at 75 years of age, then  have this test every 5 years.  You may need to have your cholesterol levels checked more often if: ? Your lipid or cholesterol levels are high. ? You are older than 75 years of age. ? You are at high risk for heart disease.  Cancer screening Lung Cancer  Lung cancer screening is recommended for adults 57-15 years old who are at high risk for lung cancer because of a history of smoking.  A yearly low-dose CT scan of the lungs is recommended for people who: ? Currently smoke. ? Have quit within the past 15 years. ? Have at least a 30-pack-year history of smoking. A pack year is smoking an average of one pack of cigarettes a day for 1 year.  Yearly screening should continue until it has been 15 years since you quit.  Yearly screening should stop if you develop a health problem that would prevent you from having lung cancer treatment.  Breast Cancer  Practice breast self-awareness. This means understanding how your breasts normally appear and feel.  It also means doing regular breast self-exams. Let your health care provider know about any changes, no matter how small.  If you are in your 20s or 30s, you should have a clinical breast exam (CBE) by a health care provider every 1-3 years as part of a regular health exam.  If you are 63 or older, have a CBE every year. Also consider having a breast X-ray (mammogram) every year.  If  you have a family history of breast cancer, talk to your health care provider about genetic screening.  If you are at high risk for breast cancer, talk to your health care provider about having an MRI and a mammogram every year.  Breast cancer gene (BRCA) assessment is recommended for women who have family members with BRCA-related cancers. BRCA-related cancers include: ? Breast. ? Ovarian. ? Tubal. ? Peritoneal cancers.  Results of the assessment will determine the need for genetic counseling and BRCA1 and BRCA2 testing.  Cervical Cancer Your health  care provider may recommend that you be screened regularly for cancer of the pelvic organs (ovaries, uterus, and vagina). This screening involves a pelvic examination, including checking for microscopic changes to the surface of your cervix (Pap test). You may be encouraged to have this screening done every 3 years, beginning at age 110.  For women ages 91-65, health care providers may recommend pelvic exams and Pap testing every 3 years, or they may recommend the Pap and pelvic exam, combined with testing for human papilloma virus (HPV), every 5 years. Some types of HPV increase your risk of cervical cancer. Testing for HPV may also be done on women of any age with unclear Pap test results.  Other health care providers may not recommend any screening for nonpregnant women who are considered low risk for pelvic cancer and who do not have symptoms. Ask your health care provider if a screening pelvic exam is right for you.  If you have had past treatment for cervical cancer or a condition that could lead to cancer, you need Pap tests and screening for cancer for at least 20 years after your treatment. If Pap tests have been discontinued, your risk factors (such as having a new sexual partner) need to be reassessed to determine if screening should resume. Some women have medical problems that increase the chance of getting cervical cancer. In these cases, your health care provider may recommend more frequent screening and Pap tests.  Colorectal Cancer  This type of cancer can be detected and often prevented.  Routine colorectal cancer screening usually begins at 75 years of age and continues through 75 years of age.  Your health care provider may recommend screening at an earlier age if you have risk factors for colon cancer.  Your health care provider may also recommend using home test kits to check for hidden blood in the stool.  A small camera at the end of a tube can be used to examine your colon  directly (sigmoidoscopy or colonoscopy). This is done to check for the earliest forms of colorectal cancer.  Routine screening usually begins at age 73.  Direct examination of the colon should be repeated every 5-10 years through 75 years of age. However, you may need to be screened more often if early forms of precancerous polyps or small growths are found.  Skin Cancer  Check your skin from head to toe regularly.  Tell your health care provider about any new moles or changes in moles, especially if there is a change in a mole's shape or color.  Also tell your health care provider if you have a mole that is larger than the size of a pencil eraser.  Always use sunscreen. Apply sunscreen liberally and repeatedly throughout the day.  Protect yourself by wearing long sleeves, pants, a wide-brimmed hat, and sunglasses whenever you are outside.  Heart disease, diabetes, and high blood pressure  High blood pressure causes heart disease and increases  the risk of stroke. High blood pressure is more likely to develop in: ? People who have blood pressure in the high end of the normal range (130-139/85-89 mm Hg). ? People who are overweight or obese. ? People who are African American.  If you are 42-16 years of age, have your blood pressure checked every 3-5 years. If you are 43 years of age or older, have your blood pressure checked every year. You should have your blood pressure measured twice-once when you are at a hospital or clinic, and once when you are not at a hospital or clinic. Record the average of the two measurements. To check your blood pressure when you are not at a hospital or clinic, you can use: ? An automated blood pressure machine at a pharmacy. ? A home blood pressure monitor.  If you are between 54 years and 65 years old, ask your health care provider if you should take aspirin to prevent strokes.  Have regular diabetes screenings. This involves taking a blood sample to  check your fasting blood sugar level. ? If you are at a normal weight and have a low risk for diabetes, have this test once every three years after 75 years of age. ? If you are overweight and have a high risk for diabetes, consider being tested at a younger age or more often. Preventing infection Hepatitis B  If you have a higher risk for hepatitis B, you should be screened for this virus. You are considered at high risk for hepatitis B if: ? You were born in a country where hepatitis B is common. Ask your health care provider which countries are considered high risk. ? Your parents were born in a high-risk country, and you have not been immunized against hepatitis B (hepatitis B vaccine). ? You have HIV or AIDS. ? You use needles to inject street drugs. ? You live with someone who has hepatitis B. ? You have had sex with someone who has hepatitis B. ? You get hemodialysis treatment. ? You take certain medicines for conditions, including cancer, organ transplantation, and autoimmune conditions.  Hepatitis C  Blood testing is recommended for: ? Everyone born from [birth year] through 1963. ? Anyone with known risk factors for hepatitis C.  Sexually transmitted infections (STIs)  You should be screened for sexually transmitted infections (STIs) including gonorrhea and chlamydia if: ? You are sexually active and are younger than 75 years of age. ? You are older than 75 years of age and your health care provider tells you that you are at risk for this type of infection. ? Your sexual activity has changed since you were last screened and you are at an increased risk for chlamydia or gonorrhea. Ask your health care provider if you are at risk.  If you do not have HIV, but are at risk, it may be recommended that you take a prescription medicine daily to prevent HIV infection. This is called pre-exposure prophylaxis (PrEP). You are considered at risk if: ? You are sexually active and do not regularly  use condoms or know the HIV status of your partner(s). ? You take drugs by injection. ? You are sexually active with a partner who has HIV.  Talk with your health care provider about whether you are at high risk of being infected with HIV. If you choose to begin PrEP, you should first be tested for HIV. You should then be tested every 3 months for as long as you are taking PrEP.  Pregnancy  If you are premenopausal and you may become pregnant, ask your health care provider about preconception counseling.  If you may become pregnant, take 400 to 800 micrograms (mcg) of folic acid every day.  If you want to prevent pregnancy, talk to your health care provider about birth control (contraception). Osteoporosis and menopause  Osteoporosis is a disease in which the bones lose minerals and strength with aging. This can result in serious bone fractures. Your risk for osteoporosis can be identified using a bone density scan.  If you are 36 years of age or older, or if you are at risk for osteoporosis and fractures, ask your health care provider if you should be screened.  Ask your health care provider whether you should take a calcium or vitamin D supplement to lower your risk for osteoporosis.  Menopause may have certain physical symptoms and risks.  Hormone replacement therapy may reduce some of these symptoms and risks. Talk to your health care provider about whether hormone replacement therapy is right for you. Follow these instructions at home:  Schedule regular health, dental, and eye exams.  Stay current with your immunizations.  Do not use any tobacco products including cigarettes, chewing tobacco, or electronic cigarettes.  If you are pregnant, do not drink alcohol.  If you are breastfeeding, limit how much and how often you drink alcohol.  Limit alcohol intake to no more than 1 drink per day for nonpregnant women. One drink equals 12 ounces of beer, 5 ounces of wine, or 1 ounces  of hard liquor.  Do not use street drugs.  Do not share needles.  Ask your health care provider for help if you need support or information about quitting drugs.  Tell your health care provider if you often feel depressed.  Tell your health care provider if you have ever been abused or do not feel safe at home. This information is not intended to replace advice given to you by your health care provider. Make sure you discuss any questions you have with your health care provider. Document Released: 07/28/2010 Document Revised: 06/20/2015 Document Reviewed: 10/16/2014 [employer] Interactive Patient Education  [employer].

## 2017-09-20 NOTE — Progress Notes (Signed)
Chief Complaint  Patient presents with  . Annual Exam    fasting today.  will discuss her recent events with Dr. Regis Bill.  would like further testing of her thyroid other than the standard testing.    HPI: Alisha Brown 77 y.o. comes in today for Preventive Medicare exam/ wellness visit . And Chronic disease management Since last visit. She has had a more compelte eval for her  arrhythmias and dizziness and doing better but very tired  Exercise wise   And noted to have  hif risk pvcs and had a cath with nl coronaries  Ef 35 %  Echo at 40 %  Now on carvedilol   ( had allergic rx to ? Contrast)  Considered for poss   ICD reluctant  To do this  Slightly down about all of this but getting back to better activities.   Has been told her neck thyroid may be swollen?  sensitive when touching neck for years   Some dysphagia ( "dont send me to gi")  No  Current carvedilol  Side effects  Still gets   visulal migraine sx but no loss or other   Has topped driving alone to be safe but doing ok no syncope  Health Maintenance  Topic Date Due  . INFLUENZA VACCINE  08/26/2017  . MAMMOGRAM  01/21/2019  . TETANUS/TDAP  09/24/2021  . COLONOSCOPY  01/24/2022  . DEXA SCAN  Completed  . PNA vac Low Risk Adult  Completed   Health Maintenance Review LIFESTYLE:  Exercise:  Not lately  Tobacco/ETS: no Alcohol:   One a week Sugar beverages:motsly water and coffee with cream.  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 No blood in the stool. No significant GU symptoms. SKIN/HEME: ,no acute skin rashes suspicious lesions or bleeding. No  lymphadenopathy, nodules, masses.  NEURO/ PSYCH:  No neurologic signs such as weakness numbness. A bit more anxious  IMM/ Allergy: No unusual infections.  Allergy .   REST of 12 system review negative except as per HPI   Past Medical History:  Diagnosis Date  . Allergy   . Anemia   . Anxiety   . Arrhythmia    heart  . CHF (congestive heart failure) ([place])   . Diverticulosis of colon (without mention of hemorrhage)   . GERD with stricture   . ZOXWRUEA(540.9)    consult dr Tomma Rakers visual migraine?  agg by HRT?  [name] Heart murmur   . Hiatal hernia   . Hyperlipidemia   . Hypertension   . Mitral valve disorder   . PVC (premature ventricular contraction)   . Seizures ([place])     Family History  Problem Relation Age of Onset  . Diabetes Mother   . Glaucoma Mother   . Diabetes Son   . Diabetes Sister        1/2 sister-same mom  . Heart murmur Sister   . Arrhythmia Sister   . Breast cancer Maternal Grandmother   . Hypertension Brother   . Heart murmur Brother   .  Arrhythmia Brother   . Diabetes Brother   . Colon cancer Neg Hx     Social History   Socioeconomic History  . Marital status: Married    Spouse name: Not on file  . Number of children: 1  . Years of education: Not on file  . Highest education level: Not on file  Occupational History  . Occupation: retired  Scientific laboratory technician  . Financial resource strain: Not on file  . Food insecurity:    Worry: Not on file    Inability: Not on file  . Transportation needs:    Medical: Not on file    Non-medical: Not on file  Tobacco Use  . Smoking status: Never Smoker  . Smokeless tobacco: Never Used  Substance and Sexual Activity  . Alcohol use: No  . Drug use: No  . Sexual activity: Not Currently    Birth control/protection: None  Lifestyle  . Physical activity:    Days per week: Not on file    Minutes per session: Not on file  . Stress: Not on file  Relationships  . Social connections:    Talks on phone: Not on file     Gets together: Not on file    Attends religious service: Not on file    Active member of club or organization: Not on file    Attends meetings of clubs or organizations: Not on file    Relationship status: Not on file  Other Topics Concern  . Not on file  Social History Narrative   HHof 2 dog    Just  Died   [name]    Retired and married   [name]      Dog with health issues  Cooks for him bladder stones    Outpatient Encounter Medications as of 09/21/2017  Medication Sig  . carvedilol (COREG) 3.125 MG tablet Take 2 tablets by mouth twice daily  . cholecalciferol (VITAMIN D) 1000 units tablet Take 1,000 Units every other day by mouth.  . estradiol (ESTRACE) 0.1 MG/GM vaginal cream Uses as needed  . ketotifen (ZADITOR) 0.025 % ophthalmic solution Place 1-2 drops 2 (two) times daily as needed into both eyes (for itchy eye).  . Magnesium 500 MG CAPS Take 500 mg daily by mouth.  . Methylsulfonylmethane (MSM) 1000 MG CAPS Take 1,000 mg daily by mouth.  . Ophthalmic Irrigation Solution (RA STERILE EYE Maurice OP) Place 1 application daily into both eyes.  . ranitidine (ZANTAC) 75 MG tablet Take 75 mg 2 (two) times daily by mouth.  . vitamin B-12 (CYANOCOBALAMIN) 1000 MCG tablet Take 1,000 mcg every other day by mouth.  . [DISCONTINUED] verapamil (COVERA HS) 180 MG (CO) 24 hr tablet Take 180 mg by mouth at bedtime.     No facility-administered encounter medications on file as of 09/21/2017.     EXAM:  BP 124/72 (BP Location: Left Arm, Patient Position: Sitting, Cuff Size: Normal)   Pulse 64   Temp 98 F (36.7 C) (Oral)   Ht 5' 5"  (1.651 m)   Wt 150 lb 6 oz (68.2 kg)   SpO2 98%   BMI 25.02 kg/m   Body mass index is 25.02 kg/m.  Physical Exam: Vital signs reviewed [id number] is a well-developed well-nourished alert cooperative   who appears stated age in no acute distress.  HEENT: normocephalic atraumatic , Eyes: PERRL EOM's full, conjunctiva clear, Nares: paten,t no deformity discharge  or tenderness., Ears: no deformity EAC's clear TMs with normal landmarks. Mouth: clear OP,  no lesions, edema.  Moist mucous membranes. Dentition in adequate repair. NECK: supple without masses,?  thyromegaly or nobruits. CHEST/PULM:  Clear to auscultation and percussion breath sounds equal no wheeze , rales or rhonchi. No chest wall deformities or tenderness.Breast: normal by inspection . No dimpling, discharge, masses, tenderness or discharge . CV: PMI is nondisplaced, S1 S2 no gallops, murmurs, rubs ocass irreg . Peripheral pulses are full without delay.No JVD .  ABDOMEN: Bowel sounds normal nontender  No guard or rebound, no hepato splenomegal no CVA tenderness.   Extremtities:  No clubbing cyanosis or edema, no acute joint swelling or redness no focal atrophy NEURO:  Oriented x3, cranial nerves 3-12 appear to be intact, no obvious focal weakness,gait within normal limits no abnormal reflexes or asymmetrical SKIN: No acute rashes normal turgor, color, no bruising or petechiae. PSYCH: Oriented, good eye contact, somewhat subdued  cognition and judgment appear normal. LN: no cervical axillary inguinal adenopathy No noted deficits in memory, attention, and speech.   Lab Results  Component Value Date   WBC 6.5 12/08/2016   HGB 13.7 12/08/2016   HCT 40.5 12/08/2016   PLT 242 12/08/2016   GLUCOSE 91 12/08/2016   CHOL 255 (H) 09/18/2016   TRIG 84.0 09/18/2016   HDL 70.70 09/18/2016   LDLDIRECT 169.1 05/30/2012   LDLCALC 168 (H) 09/18/2016   ALT 19 09/18/2016   AST 19 09/18/2016   NA 141 12/08/2016   K 4.4 12/08/2016   CL 101 12/08/2016   CREATININE 0.90 12/08/2016   BUN 20 12/08/2016   CO2 24 12/08/2016   TSH 1.25 09/18/2016   INR 1.0 12/08/2016   HGBA1C 6.3 02/02/2014    ASSESSMENT AND PLAN:  Discussed the following assessment and plan:  Visit for preventive health examination  Medication management - Plan: Basic metabolic panel, CBC with Differential/Platelet, Hemoglobin  A1c, Hepatic function panel, Lipid panel, TSH, T4, free, T3, free, POCT Urinalysis Dipstick (Automated)  Non-ischemic cardiomyopathy ([place]) - Plan: Basic metabolic panel, CBC with Differential/Platelet, Hemoglobin A1c, Hepatic function panel, Lipid panel, TSH, T4, free, T3, free, POCT Urinalysis Dipstick (Automated)  Gastroesophageal reflux disease, esophagitis presence not specified - Plan: Basic metabolic panel, CBC with Differential/Platelet, Hemoglobin A1c, Hepatic function panel, Lipid panel, TSH, T4, free, T3, free, POCT Urinalysis Dipstick (Automated)  PVCs (premature ventricular contractions) - Plan: Basic metabolic panel, CBC with Differential/Platelet, Hemoglobin A1c, Hepatic function panel, Lipid panel, TSH, T4, free, T3, free, POCT Urinalysis Dipstick (Automated)  Hyperglycemia - Plan: Basic metabolic panel, CBC with Differential/Platelet, Hemoglobin A1c, Hepatic function panel, Lipid panel, TSH, T4, free, T3, free, POCT Urinalysis Dipstick (Automated) reviewed  Vaccine   Disc current health and how to consider  Interventions.  Is well x the CM   Has understandable concerns and worried   Has fu wioth dr Lovena Le in October  Patient Care Team: Burnis Medin, MD as PCP - General End, Harrell Gave, MD as PCP - Cardiology (Cardiology) Evans Lance, MD (Cardiology) Jari Pigg, MD (Dermatology) DUNN  for eye exams   Patient Instructions  Checking blood tests and  Thyroid tests today .  Get flu  vaccine  When available and shingrix when possible.    Health Maintenance, Female Adopting a healthy lifestyle and getting preventive care can go a long way to promote health and wellness. Talk with your health care provider about what schedule of regular examinations is right for you. This is a good chance for you to check in with your provider about disease prevention and  staying healthy. In between checkups, there are plenty of things you can do on your own. Experts have done a lot of  research about which lifestyle changes and preventive measures are most likely to keep you healthy. Ask your health care provider for more information. Weight and diet Eat a healthy diet  Be sure to include plenty of vegetables, fruits, low-fat dairy products, and lean protein.  Do not eat a lot of foods high in solid fats, added sugars, or salt.  Get regular exercise. This is one of the most important things you can do for your health. ? Most adults should exercise for at least 150 minutes each week. The exercise should increase your heart rate and make you sweat (moderate-intensity exercise). ? Most adults should also do strengthening exercises at least twice a week. This is in addition to the moderate-intensity exercise.  Maintain a healthy weight  Body mass index (BMI) is a measurement that can be used to identify possible weight problems. It estimates body fat based on height and weight. Your health care provider can help determine your BMI and help you achieve or maintain a healthy weight.  For females 26 years of age and older: ? A BMI below 18.5 is considered underweight. ? A BMI of 18.5 to 24.9 is normal. ? A BMI of 25 to 29.9 is considered overweight. ? A BMI of 30 and above is considered obese.  Watch levels of cholesterol and blood lipids  You should start having your blood tested for lipids and cholesterol at 75 years of age, then have this test every 5 years.  You may need to have your cholesterol levels checked more often if: ? Your lipid or cholesterol levels are high. ? You are older than 75 years of age. ? You are at high risk for heart disease.  Cancer screening Lung Cancer  Lung cancer screening is recommended for adults 29-46 years old who are at high risk for lung cancer because of a history of smoking.  A yearly low-dose CT scan of the lungs is recommended for people who: ? Currently smoke. ? Have quit within the past 15 years. ? Have at least a  30-pack-year history of smoking. A pack year is smoking an average of one pack of cigarettes a day for 1 year.  Yearly screening should continue until it has been 15 years since you quit.  Yearly screening should stop if you develop a health problem that would prevent you from having lung cancer treatment.  Breast Cancer  Practice breast self-awareness. This means understanding how your breasts normally appear and feel.  It also means doing regular breast self-exams. Let your health care provider know about any changes, no matter how small.  If you are in your 20s or 30s, you should have a clinical breast exam (CBE) by a health care provider every 1-3 years as part of a regular health exam.  If you are 64 or older, have a CBE every year. Also consider having a breast X-ray (mammogram) every year.  If you have a family history of breast cancer, talk to your health care provider about genetic screening.  If you are at high risk for breast cancer, talk to your health care provider about having an MRI and a mammogram every year.  Breast cancer gene (BRCA) assessment is recommended for women who have family members with BRCA-related cancers. BRCA-related cancers include: ? Breast. ? Ovarian. ? Tubal. ? Peritoneal cancers.  Results of the assessment will  determine the need for genetic counseling and BRCA1 and BRCA2 testing.  Cervical Cancer Your health care provider may recommend that you be screened regularly for cancer of the pelvic organs (ovaries, uterus, and vagina). This screening involves a pelvic examination, including checking for microscopic changes to the surface of your cervix (Pap test). You may be encouraged to have this screening done every 3 years, beginning at age 52.  For women ages 54-65, health care providers may recommend pelvic exams and Pap testing every 3 years, or they may recommend the Pap and pelvic exam, combined with testing for human papilloma virus (HPV), every  5 years. Some types of HPV increase your risk of cervical cancer. Testing for HPV may also be done on women of any age with unclear Pap test results.  Other health care providers may not recommend any screening for nonpregnant women who are considered low risk for pelvic cancer and who do not have symptoms. Ask your health care provider if a screening pelvic exam is right for you.  If you have had past treatment for cervical cancer or a condition that could lead to cancer, you need Pap tests and screening for cancer for at least 20 years after your treatment. If Pap tests have been discontinued, your risk factors (such as having a new sexual partner) need to be reassessed to determine if screening should resume. Some women have medical problems that increase the chance of getting cervical cancer. In these cases, your health care provider may recommend more frequent screening and Pap tests.  Colorectal Cancer  This type of cancer can be detected and often prevented.  Routine colorectal cancer screening usually begins at 75 years of age and continues through 75 years of age.  Your health care provider may recommend screening at an earlier age if you have risk factors for colon cancer.  Your health care provider may also recommend using home test kits to check for hidden blood in the stool.  A small camera at the end of a tube can be used to examine your colon directly (sigmoidoscopy or colonoscopy). This is done to check for the earliest forms of colorectal cancer.  Routine screening usually begins at age 19.  Direct examination of the colon should be repeated every 5-10 years through 75 years of age. However, you may need to be screened more often if early forms of precancerous polyps or small growths are found.  Skin Cancer  Check your skin from head to toe regularly.  Tell your health care provider about any new moles or changes in moles, especially if there is a change in a mole's shape  or color.  Also tell your health care provider if you have a mole that is larger than the size of a pencil eraser.  Always use sunscreen. Apply sunscreen liberally and repeatedly throughout the day.  Protect yourself by wearing long sleeves, pants, a wide-brimmed hat, and sunglasses whenever you are outside.  Heart disease, diabetes, and high blood pressure  High blood pressure causes heart disease and increases the risk of stroke. High blood pressure is more likely to develop in: ? People who have blood pressure in the high end of the normal range (130-139/85-89 mm Hg). ? People who are overweight or obese. ? People who are African American.  If you are 95-90 years of age, have your blood pressure checked every 3-5 years. If you are 44 years of age or older, have your blood pressure checked every year. You should  have your blood pressure measured twice-once when you are at a hospital or clinic, and once when you are not at a hospital or clinic. Record the average of the two measurements. To check your blood pressure when you are not at a hospital or clinic, you can use: ? An automated blood pressure machine at a pharmacy. ? A home blood pressure monitor.  If you are between 59 years and 79 years old, ask your health care provider if you should take aspirin to prevent strokes.  Have regular diabetes screenings. This involves taking a blood sample to check your fasting blood sugar level. ? If you are at a normal weight and have a low risk for diabetes, have this test once every three years after 75 years of age. ? If you are overweight and have a high risk for diabetes, consider being tested at a younger age or more often. Preventing infection Hepatitis B  If you have a higher risk for hepatitis B, you should be screened for this virus. You are considered at high risk for hepatitis B if: ? You were born in a country where hepatitis B is common. Ask your health care provider which countries  are considered high risk. ? Your parents were born in a high-risk country, and you have not been immunized against hepatitis B (hepatitis B vaccine). ? You have HIV or AIDS. ? You use needles to inject street drugs. ? You live with someone who has hepatitis B. ? You have had sex with someone who has hepatitis B. ? You get hemodialysis treatment. ? You take certain medicines for conditions, including cancer, organ transplantation, and autoimmune conditions.  Hepatitis C  Blood testing is recommended for: ? Everyone born from [birth year] through 1963. ? Anyone with known risk factors for hepatitis C.  Sexually transmitted infections (STIs)  You should be screened for sexually transmitted infections (STIs) including gonorrhea and chlamydia if: ? You are sexually active and are younger than 76 years of age. ? You are older than 75 years of age and your health care provider tells you that you are at risk for this type of infection. ? Your sexual activity has changed since you were last screened and you are at an increased risk for chlamydia or gonorrhea. Ask your health care provider if you are at risk.  If you do not have HIV, but are at risk, it may be recommended that you take a prescription medicine daily to prevent HIV infection. This is called pre-exposure prophylaxis (PrEP). You are considered at risk if: ? You are sexually active and do not regularly use condoms or know the HIV status of your partner(s). ? You take drugs by injection. ? You are sexually active with a partner who has HIV.  Talk with your health care provider about whether you are at high risk of being infected with HIV. If you choose to begin PrEP, you should first be tested for HIV. You should then be tested every 3 months for as long as you are taking PrEP. Pregnancy  If you are premenopausal and you may become pregnant, ask your health care provider about preconception counseling.  If you may become pregnant, take 400  to 800 micrograms (mcg) of folic acid every day.  If you want to prevent pregnancy, talk to your health care provider about birth control (contraception). Osteoporosis and menopause  Osteoporosis is a disease in which the bones lose minerals and strength with aging. This can result in serious bone  fractures. Your risk for osteoporosis can be identified using a bone density scan.  If you are 32 years of age or older, or if you are at risk for osteoporosis and fractures, ask your health care provider if you should be screened.  Ask your health care provider whether you should take a calcium or vitamin D supplement to lower your risk for osteoporosis.  Menopause may have certain physical symptoms and risks.  Hormone replacement therapy may reduce some of these symptoms and risks. Talk to your health care provider about whether hormone replacement therapy is right for you. Follow these instructions at home:  Schedule regular health, dental, and eye exams.  Stay current with your immunizations.  Do not use any tobacco products including cigarettes, chewing tobacco, or electronic cigarettes.  If you are pregnant, do not drink alcohol.  If you are breastfeeding, limit how much and how often you drink alcohol.  Limit alcohol intake to no more than 1 drink per day for nonpregnant women. One drink equals 12 ounces of beer, 5 ounces of wine, or 1 ounces of hard liquor.  Do not use street drugs.  Do not share needles.  Ask your health care provider for help if you need support or information about quitting drugs.  Tell your health care provider if you often feel depressed.  Tell your health care provider if you have ever been abused or do not feel safe at home. This information is not intended to replace advice given to you by your health care provider. Make sure you discuss any questions you have with your health care provider. Document Released: 07/28/2010 Document Revised: 06/20/2015  Document Reviewed: 10/16/2014 Elsevier Interactive Patient Education  2018 [place]. Story Vanvranken M.D.

## 2017-09-21 ENCOUNTER — Encounter: Payer: Self-pay | Admitting: Internal Medicine

## 2017-09-21 ENCOUNTER — Ambulatory Visit (INDEPENDENT_AMBULATORY_CARE_PROVIDER_SITE_OTHER): Payer: Medicare HMO | Admitting: Internal Medicine

## 2017-09-21 VITALS — BP 124/72 | HR 64 | Temp 98.0°F | Ht 65.0 in | Wt 150.4 lb

## 2017-09-21 DIAGNOSIS — I428 Other cardiomyopathies: Secondary | ICD-10-CM

## 2017-09-21 DIAGNOSIS — R739 Hyperglycemia, unspecified: Secondary | ICD-10-CM

## 2017-09-21 DIAGNOSIS — I493 Ventricular premature depolarization: Secondary | ICD-10-CM | POA: Diagnosis not present

## 2017-09-21 DIAGNOSIS — Z0000 Encounter for general adult medical examination without abnormal findings: Secondary | ICD-10-CM | POA: Diagnosis not present

## 2017-09-21 DIAGNOSIS — Z79899 Other long term (current) drug therapy: Secondary | ICD-10-CM | POA: Diagnosis not present

## 2017-09-21 DIAGNOSIS — K219 Gastro-esophageal reflux disease without esophagitis: Secondary | ICD-10-CM | POA: Diagnosis not present

## 2017-09-21 LAB — TSH: TSH: 1.29 u[IU]/mL (ref 0.35–4.50)

## 2017-09-21 LAB — CBC WITH DIFFERENTIAL/PLATELET
BASOS PCT: 0.8 % (ref 0.0–3.0)
Basophils Absolute: 0 10*3/uL (ref 0.0–0.1)
EOS PCT: 1.9 % (ref 0.0–5.0)
Eosinophils Absolute: 0.1 10*3/uL (ref 0.0–0.7)
HCT: 42.8 % (ref 36.0–46.0)
Hemoglobin: 14.2 g/dL (ref 12.0–15.0)
LYMPHS ABS: 2.3 10*3/uL (ref 0.7–4.0)
Lymphocytes Relative: 42 % (ref 12.0–46.0)
MCHC: 33.3 g/dL (ref 30.0–36.0)
MCV: 90.2 fl (ref 78.0–100.0)
MONO ABS: 0.4 10*3/uL (ref 0.1–1.0)
Monocytes Relative: 7.1 % (ref 3.0–12.0)
NEUTROS ABS: 2.7 10*3/uL (ref 1.4–7.7)
NEUTROS PCT: 48.2 % (ref 43.0–77.0)
PLATELETS: 241 10*3/uL (ref 150.0–400.0)
RBC: 4.75 Mil/uL (ref 3.87–5.11)
RDW: 13.8 % (ref 11.5–15.5)
WBC: 5.6 10*3/uL (ref 4.0–10.5)

## 2017-09-21 LAB — LIPID PANEL
CHOLESTEROL: 270 mg/dL — AB (ref 0–200)
HDL: 66.9 mg/dL (ref >39.00)
LDL Cholesterol: 187 mg/dL — ABNORMAL HIGH (ref 0–99)
NonHDL: 203.09
Total CHOL/HDL Ratio: 4
Triglycerides: 81 mg/dL (ref 0.0–149.0)
VLDL: 16.2 mg/dL (ref 0.0–40.0)

## 2017-09-21 LAB — POC URINALSYSI DIPSTICK (AUTOMATED)
Bilirubin, UA: NEGATIVE
Glucose, UA: NEGATIVE
KETONES UA: NEGATIVE
LEUKOCYTES UA: NEGATIVE
Nitrite, UA: NEGATIVE
PH UA: 7 (ref 5.0–8.0)
Protein, UA: NEGATIVE
RBC UA: NEGATIVE
Spec Grav, UA: 1.015 (ref 1.010–1.025)
Urobilinogen, UA: 0.2 E.U./dL

## 2017-09-21 LAB — BASIC METABOLIC PANEL
BUN: 22 mg/dL (ref 6–23)
CO2: 30 mEq/L (ref 19–32)
Calcium: 9.6 mg/dL (ref 8.4–10.5)
Chloride: 102 mEq/L (ref 96–112)
Creatinine, Ser: 0.97 mg/dL (ref 0.40–1.20)
GFR: 59.52 mL/min — ABNORMAL LOW (ref >60.00)
GLUCOSE: 98 mg/dL (ref 70–99)
POTASSIUM: 4 meq/L (ref 3.5–5.1)
Sodium: 139 mEq/L (ref 135–145)

## 2017-09-21 LAB — HEPATIC FUNCTION PANEL
ALBUMIN: 4.6 g/dL (ref 3.5–5.2)
ALT: 18 U/L (ref 0–35)
AST: 16 U/L (ref 0–37)
Alkaline Phosphatase: 69 U/L (ref 39–117)
Bilirubin, Direct: 0.1 mg/dL (ref 0.0–0.3)
Total Bilirubin: 0.8 mg/dL (ref 0.2–1.2)
Total Protein: 7 g/dL (ref 6.0–8.3)

## 2017-09-21 LAB — T4, FREE: Free T4: 0.85 ng/dL (ref 0.60–1.60)

## 2017-09-21 LAB — HEMOGLOBIN A1C: HEMOGLOBIN A1C: 6.3 % (ref 4.6–6.5)

## 2017-09-21 LAB — T3, FREE: T3 FREE: 3.7 pg/mL (ref 2.3–4.2)

## 2017-09-24 ENCOUNTER — Other Ambulatory Visit: Payer: Self-pay | Admitting: Internal Medicine

## 2017-09-24 DIAGNOSIS — I493 Ventricular premature depolarization: Secondary | ICD-10-CM

## 2017-10-19 ENCOUNTER — Encounter: Payer: Self-pay | Admitting: Internal Medicine

## 2017-11-03 ENCOUNTER — Ambulatory Visit: Payer: Medicare HMO | Admitting: Internal Medicine

## 2017-11-03 ENCOUNTER — Encounter: Payer: Self-pay | Admitting: Internal Medicine

## 2017-11-03 VITALS — BP 122/82 | HR 63 | Ht 65.0 in | Wt 150.4 lb

## 2017-11-03 DIAGNOSIS — I428 Other cardiomyopathies: Secondary | ICD-10-CM | POA: Diagnosis not present

## 2017-11-03 DIAGNOSIS — I493 Ventricular premature depolarization: Secondary | ICD-10-CM | POA: Diagnosis not present

## 2017-11-03 NOTE — Patient Instructions (Addendum)

## 2017-11-03 NOTE — Progress Notes (Signed)
HPI [name] returns today for ongoing evaluation and management of her PVC's. She was seen by me 2 months ago and we started her on coreg and her dizziness has improved. She still has palpitations but feels better. Her blood pressure has been a little high. No syncope. Her EF was 40%.she has managed to increase the dose of her coreg and she is now taking 6.25 mg twice daily. She still has some occaisional palpitations. She has been bothered by insomnia.  Allergies  Allergen Reactions  . Contrast Media [Iodinated Diagnostic Agents] Hives    Hives/nausea  . Aleve [Naproxen Sodium] Hives  . Augmentin [Amoxicillin-Pot Clavulanate] Diarrhea and Nausea And Vomiting    Has patient had a PCN reaction causing immediate rash, facial/tongue/throat swelling, SOB or lightheadedness with hypotension: No Has patient had a PCN reaction causing severe rash involving mucus membranes or skin necrosis: No Has patient had a PCN reaction that required hospitalization: No Has patient had a PCN reaction occurring within the last 10 years: No--nausea/vomitng ONLY (pt believes dosage was too high) If all of the above answers are "NO", then may proceed with Cephalosporin use.   . Codeine Nausea And Vomiting and Other (See Comments)    Dizziness.  . Gabapentin Other (See Comments)    pseudo seizures.  [name] Hives  . Propoxyphene N-Acetaminophen     REACTION: hives,dizzy,gi upset     Current Outpatient Medications  Medication Sig Dispense Refill  . carvedilol (COREG) 3.125 MG tablet Take 2 tablets by mouth twice daily    . cholecalciferol (VITAMIN D) 1000 units tablet Take 1,000 Units every other day by mouth.    . estradiol (ESTRACE) 0.1 MG/GM vaginal cream Uses as needed  12  . famotidine (PEPCID) 20 MG tablet Take 20 mg by mouth 2 (two) times daily.    [name] ketotifen (ZADITOR) 0.025 % ophthalmic solution Place 1-2 drops 2 (two) times daily as needed into both eyes (for itchy eye).    .  Magnesium 500 MG CAPS Take 500 mg daily by mouth.    . Methylsulfonylmethane (MSM) 1000 MG CAPS Take 1,000 mg daily by mouth.    . Ophthalmic Irrigation Solution (RA STERILE EYE [place] OP) Place 1 application daily into both eyes.    . vitamin B-12 (CYANOCOBALAMIN) 1000 MCG tablet Take 1,000 mcg every other day by mouth.     No current facility-administered medications for this visit.      Past Medical History:  Diagnosis Date  . Allergy   . Anemia   . Anxiety   . Arrhythmia    heart  . CHF (congestive heart failure) ([place])   . Diverticulosis of colon (without mention of hemorrhage)   . GERD with stricture   . LDJTTSVX(793.9)    consult dr Tomma Rakers visual migraine?  agg by HRT?  [name] Heart murmur   . Hiatal hernia   . Hyperlipidemia   . Hypertension   . Mitral valve disorder   . PVC (premature ventricular contraction)   . Seizures ([place])     ROS:   All systems reviewed and negative except as noted in the HPI.   Past Surgical History:  Procedure Laterality Date  . ABDOMINAL HYSTERECTOMY  1987  . ADENOIDECTOMY    . APPENDECTOMY  1987  . BREAST EXCISIONAL BIOPSY    . BREAST SURGERY Right 1990   biopsy,benign  . CARDIAC CATHETERIZATION  2009   No CAD. LVEF 50%.  . COLONOSCOPY    .  ESOPHAGOGASTRODUODENOSCOPY    . FOOT SURGERY Right 2011  . RIGHT/LEFT HEART CATH AND CORONARY ANGIOGRAPHY N/A 12/10/2016   Procedure: RIGHT/LEFT HEART CATH AND CORONARY ANGIOGRAPHY;  Surgeon: Nelva Bush, MD;  Location: [facility];  Service: Cardiovascular;  Laterality: N/A;  . TONSILLECTOMY  1953  . TUBAL LIGATION       Family History  Problem Relation Age of Onset  . Diabetes Mother   . Glaucoma Mother   . Diabetes Son   . Diabetes Sister        1/2 sister-same mom  . Heart murmur Sister   . Arrhythmia Sister   . Breast cancer Maternal Grandmother   . Hypertension Brother   . Heart murmur Brother   . Arrhythmia Brother   . Diabetes Brother   . Colon cancer Neg Hx       Social History   Socioeconomic History  . Marital status: Married    Spouse name: Not on file  . Number of children: 1  . Years of education: Not on file  . Highest education level: Not on file  Occupational History  . Occupation: retired  Scientific laboratory technician  . Financial resource strain: Not on file  . Food insecurity:    Worry: Not on file    Inability: Not on file  . Transportation needs:    Medical: Not on file    Non-medical: Not on file  Tobacco Use  . Smoking status: Never Smoker  . Smokeless tobacco: Never Used  Substance and Sexual Activity  . Alcohol use: No  . Drug use: No  . Sexual activity: Not Currently    Birth control/protection: None  Lifestyle  . Physical activity:    Days per week: Not on file    Minutes per session: Not on file  . Stress: Not on file  Relationships  . Social connections:    Talks on phone: Not on file    Gets together: Not on file    Attends religious service: Not on file    Active member of club or organization: Not on file    Attends meetings of clubs or organizations: Not on file    Relationship status: Not on file  . Intimate partner violence:    Fear of current or ex partner: Not on file    Emotionally abused: Not on file    Physically abused: Not on file    Forced sexual activity: Not on file  Other Topics Concern  . Not on file  Social History Narrative   HHof 2 dog    Just  Died   [name]    Retired and married   [name]      Dog with health issues  Cooks for him bladder stones     BP 122/82   Pulse 63   Ht 5\' 5"  (1.651 m)   Wt 150 lb 6.4 oz (68.2 kg)   SpO2 97%   BMI 25.03 kg/m   Physical Exam:  Well appearing NAD HEENT: Unremarkable Neck:  No JVD, no thyromegally Lymphatics:  No adenopathy Back:  No CVA tenderness Lungs:  Clear with no wheezes HEART:  Regular rate rhythm, no murmurs, no rubs, no clicks Abd:  soft, positive bowel sounds, no organomegally, no rebound, no guarding Ext:  2 plus pulses, no  edema, no cyanosis, no clubbing Skin:  No rashes no nodules Neuro:  CN II through XII intact, motor grossly intact  EKG - none   Assess/Plan: 1. Chronic systolic heart  failure - her symptoms are class 2. Her last echo EF was 40%. She will continue her current meds. 2. PVC"s - she is taking coreg. We discussed trying additional AA drugs. She is not inclined to do so. 3. Insomnia - we spent time discussing sleep hygiene. I would try and hold off on sleeping meds at this time.  Mikle Bosworth.D.

## 2017-12-01 ENCOUNTER — Ambulatory Visit (INDEPENDENT_AMBULATORY_CARE_PROVIDER_SITE_OTHER): Payer: Medicare HMO | Admitting: *Deleted

## 2017-12-01 DIAGNOSIS — Z23 Encounter for immunization: Secondary | ICD-10-CM

## 2017-12-08 ENCOUNTER — Other Ambulatory Visit: Payer: Self-pay | Admitting: Internal Medicine

## 2017-12-08 DIAGNOSIS — Z1231 Encounter for screening mammogram for malignant neoplasm of breast: Secondary | ICD-10-CM

## 2018-01-27 ENCOUNTER — Ambulatory Visit
Admission: RE | Admit: 2018-01-27 | Discharge: 2018-01-27 | Disposition: A | Discharge status: Home / Self Care | Payer: Medicare HMO | Source: Ambulatory Visit | Attending: Internal Medicine | Admitting: Internal Medicine

## 2018-01-27 DIAGNOSIS — Z1231 Encounter for screening mammogram for malignant neoplasm of breast: Secondary | ICD-10-CM

## 2018-05-12 ENCOUNTER — Ambulatory Visit: Payer: Medicare HMO | Admitting: Internal Medicine

## 2018-06-02 DIAGNOSIS — H40053 Ocular hypertension, bilateral: Secondary | ICD-10-CM | POA: Diagnosis not present

## 2018-06-02 DIAGNOSIS — H40013 Open angle with borderline findings, low risk, bilateral: Secondary | ICD-10-CM | POA: Diagnosis not present

## 2018-06-09 ENCOUNTER — Telehealth: Payer: Self-pay | Admitting: Internal Medicine

## 2018-06-09 NOTE — Telephone Encounter (Signed)
New message   Spoke w/pt about appt on 05.19.20. Pt has a smart phone but did not feel comfortable with video visit. Pt said she would do a phone call with Dr. Lovena Le. Pt phone number is listed in appt notes.      Virtual Visit Pre-Appointment Phone Call  "(Name), I am calling you today to discuss your upcoming appointment. We are currently trying to limit exposure to the virus that causes COVID-19 by seeing patients at home rather than in the office."  1. "What is the BEST phone number to call the day of the visit?" - include this in appointment notes  2. Do you have or have access to (through a family member/friend) a smartphone with video capability that we can use for your visit?" a. If yes - list this number in appt notes as cell (if different from BEST phone #) and list the appointment type as a VIDEO visit in appointment notes b. If no - list the appointment type as a PHONE visit in appointment notes  3. Confirm consent - "In the setting of the current Covid19 crisis, you are scheduled for a (phone or video) visit with your provider on (date) at (time).  Just as we do with many in-office visits, in order for you to participate in this visit, we must obtain consent.  If you'd like, I can send this to your mychart (if signed up) or email for you to review.  Otherwise, I can obtain your verbal consent now.  All virtual visits are billed to your insurance company just like a normal visit would be.  By agreeing to a virtual visit, we'd like you to understand that the technology does not allow for your provider to perform an examination, and thus may limit your provider's ability to fully assess your condition. If your provider identifies any concerns that need to be evaluated in person, we will make arrangements to do so.  Finally, though the technology is pretty good, we cannot assure that it will always work on either your or our end, and in the setting of a video visit, we may have to  convert it to a phone-only visit.  In either situation, we cannot ensure that we have a secure connection.  Are you willing to proceed?" STAFF: Did the patient verbally acknowledge consent to telehealth visit? Document YES/NO here: YES  4. Advise patient to be prepared - "Two hours prior to your appointment, go ahead and check your blood pressure, pulse, oxygen saturation, and your weight (if you have the equipment to check those) and write them all down. When your visit starts, your provider will ask you for this information. If you have an Apple Watch or Kardia device, please plan to have heart rate information ready on the day of your appointment. Please have a pen and paper handy nearby the day of the visit as well."  5. Give patient instructions for MyChart download to smartphone OR Doximity/Doxy.me as below if video visit (depending on what platform provider is using)  6. Inform patient they will receive a phone call 15 minutes prior to their appointment time (may be from unknown caller ID) so they should be prepared to answer    TELEPHONE CALL NOTE  Alisha Brown has been deemed a candidate for a follow-up tele-health visit to limit community exposure during the Covid-19 pandemic. I spoke with the patient via phone to ensure availability of phone/video source, confirm preferred email & phone number, and discuss instructions  and expectations.  I reminded Alisha Brown to be prepared with any vital sign and/or heart rhythm information that could potentially be obtained via home monitoring, at the time of her visit. I reminded Alisha Brown to expect a phone call prior to her visit.  Ashland Harriette Ohara 06/09/2018 10:01 AM   INSTRUCTIONS FOR DOWNLOADING THE MYCHART APP TO SMARTPHONE  - The patient must first make sure to have activated MyChart and know their login information - If Apple, go to [employer] and type in MyChart in the search bar and download the app. If Android, ask  patient to go to [employer] and type in [place] in the search bar and download the app. The app is free but as with any other app downloads, their phone may require them to verify saved payment information or Apple/Android password.  - The patient will need to then log into the app with their MyChart username and password, and select [facility] as their healthcare provider to link the account. When it is time for your visit, go to the MyChart app, find appointments, and click Begin Video Visit. Be sure to Select Allow for your device to access the Microphone and Camera for your visit. You will then be connected, and your provider will be with you shortly.  **If they have any issues connecting, or need assistance please contact MyChart service desk [phone])**  **If using a computer, in order to ensure the best quality for their visit they will need to use either of the following Internet Browsers: [employer], or Google Chrome**  IF USING DOXIMITY or DOXY.ME - The patient will receive a link just prior to their visit by text.     FULL LENGTH CONSENT FOR TELE-HEALTH VISIT   I hereby voluntarily request, consent and authorize [facility] and its employed or contracted physicians, physician assistants, nurse practitioners or other licensed health care professionals (the Practitioner), to provide me with telemedicine health care services (the Services") as deemed necessary by the treating Practitioner. I acknowledge and consent to receive the Services by the Practitioner via telemedicine. I understand that the telemedicine visit will involve communicating with the Practitioner through live audiovisual communication technology and the disclosure of certain medical information by electronic transmission. I acknowledge that I have been given the opportunity to request an in-person assessment or other available alternative prior to the telemedicine visit and am voluntarily  participating in the telemedicine visit.  I understand that I have the right to withhold or withdraw my consent to the use of telemedicine in the course of my care at any time, without affecting my right to future care or treatment, and that the Practitioner or I may terminate the telemedicine visit at any time. I understand that I have the right to inspect all information obtained and/or recorded in the course of the telemedicine visit and may receive copies of available information for a reasonable fee.  I understand that some of the potential risks of receiving the Services via telemedicine include:   Delay or interruption in medical evaluation due to technological equipment failure or disruption;  Information transmitted may not be sufficient (e.g. poor resolution of images) to allow for appropriate medical decision making by the Practitioner; and/or   In rare instances, security protocols could fail, causing a breach of personal health information.  Furthermore, I acknowledge that it is my responsibility to provide information about my medical history, conditions and care that is complete and accurate to  the best of my ability. I acknowledge that Practitioner's advice, recommendations, and/or decision may be based on factors not within their control, such as incomplete or inaccurate data provided by me or distortions of diagnostic images or specimens that may result from electronic transmissions. I understand that the practice of medicine is not an exact science and that Practitioner makes no warranties or guarantees regarding treatment outcomes. I acknowledge that I will receive a copy of this consent concurrently upon execution via email to the email address I last provided but may also request a printed copy by calling the office of [facility].    I understand that my insurance will be billed for this visit.   I have read or had this consent read to me.  I understand the contents of this  consent, which adequately explains the benefits and risks of the Services being provided via telemedicine.   I have been provided ample opportunity to ask questions regarding this consent and the Services and have had my questions answered to my satisfaction.  I give my informed consent for the services to be provided through the use of telemedicine in my medical care  By participating in this telemedicine visit I agree to the above.

## 2018-06-13 DIAGNOSIS — Z0100 Encounter for examination of eyes and vision without abnormal findings: Secondary | ICD-10-CM | POA: Diagnosis not present

## 2018-06-14 ENCOUNTER — Other Ambulatory Visit: Payer: Self-pay

## 2018-06-14 ENCOUNTER — Telehealth (INDEPENDENT_AMBULATORY_CARE_PROVIDER_SITE_OTHER): Payer: Medicare HMO | Admitting: Internal Medicine

## 2018-06-14 DIAGNOSIS — I428 Other cardiomyopathies: Secondary | ICD-10-CM

## 2018-06-14 DIAGNOSIS — I493 Ventricular premature depolarization: Secondary | ICD-10-CM | POA: Diagnosis not present

## 2018-06-14 DIAGNOSIS — I10 Essential (primary) hypertension: Secondary | ICD-10-CM

## 2018-06-14 MED ORDER — CARVEDILOL 3.125 MG PO TABS: 3.1250 mg | ORAL_TABLET | Freq: Two times a day (BID) | ORAL | 3 refills | Status: DC

## 2018-06-14 NOTE — Progress Notes (Signed)
Electrophysiology TeleHealth Note   Due to national recommendations of social distancing due to COVID 19, an audio/video telehealth visit is felt to be most appropriate for this patient at this time.  See MyChart message from today for the patient's consent to telehealth for [name].   Date:  06/14/2018   ID:  Alisha Brown, DOB 09/11/1940, MRN [id number]  Location: patient's home  Provider location: [address], [place]  Evaluation Performed: Follow-up visit  PCP:  Burnis Medin, MD  Cardiologist:  Nelva Bush, MD Electrophysiologist:  Dr Lovena Le  Chief Complaint:  "I'm ok. I still get dizzy and I cant sleep."  History of Present Illness:    Alisha Brown is a 78 y.o. female who presents via audio/video conferencing for a telehealth visit today.  Since last being seen in our clinic, the patient reports dyspnea with walking but is able to ride her exercise bike. She notes some palpitations. The patient denies symptoms of fevers, chills, cough, or new SOB worrisome for COVID 19.  Past Medical History:  Diagnosis Date  . Allergy   . Anemia   . Anxiety   . Arrhythmia    heart  . CHF (congestive heart failure) ([place])   . Diverticulosis of colon (without mention of hemorrhage)   . GERD with stricture   . RXVQMGQQ(761.9)    consult dr Tomma Rakers visual migraine?  agg by HRT?  [name] Heart murmur   . Hiatal hernia   . Hyperlipidemia   . Hypertension   . Mitral valve disorder   . PVC (premature ventricular contraction)   . Seizures ([place])     Past Surgical History:  Procedure Laterality Date  . ABDOMINAL HYSTERECTOMY  1987  . ADENOIDECTOMY    . APPENDECTOMY  1987  . BREAST EXCISIONAL BIOPSY    . BREAST SURGERY Right 1990   biopsy,benign  . CARDIAC CATHETERIZATION  2009   No CAD. LVEF 50%.  . COLONOSCOPY    . ESOPHAGOGASTRODUODENOSCOPY    . FOOT SURGERY Right 2011  . RIGHT/LEFT HEART CATH AND CORONARY ANGIOGRAPHY N/A 12/10/2016   Procedure:  RIGHT/LEFT HEART CATH AND CORONARY ANGIOGRAPHY;  Surgeon: Nelva Bush, MD;  Location: [facility];  Service: Cardiovascular;  Laterality: N/A;  . TONSILLECTOMY  1953  . TUBAL LIGATION      Current Outpatient Medications  Medication Sig Dispense Refill  . carvedilol (COREG) 3.125 MG tablet Take 2 tablets by mouth twice daily    . cholecalciferol (VITAMIN D) 1000 units tablet Take 1,000 Units every other day by mouth.    . estradiol (ESTRACE) 0.1 MG/GM vaginal cream Uses as needed  12  . famotidine (PEPCID) 20 MG tablet Take 20 mg by mouth 2 (two) times daily.    [name] ketotifen (ZADITOR) 0.025 % ophthalmic solution Place 1-2 drops 2 (two) times daily as needed into both eyes (for itchy eye).    . Magnesium 500 MG CAPS Take 500 mg daily by mouth.    . Methylsulfonylmethane (MSM) 1000 MG CAPS Take 1,000 mg daily by mouth.    . Ophthalmic Irrigation Solution (RA STERILE EYE [place] OP) Place 1 application daily into both eyes.    . vitamin B-12 (CYANOCOBALAMIN) 1000 MCG tablet Take 1,000 mcg every other day by mouth.     No current facility-administered medications for this visit.     Allergies:   Contrast media [iodinated diagnostic agents]; Aleve [naproxen sodium]; Augmentin [amoxicillin-pot clavulanate]; Codeine; Gabapentin; Krill oil; and  Propoxyphene n-acetaminophen   Social History:  The patient  reports that she has never smoked. She has never used smokeless tobacco. She reports that she does not drink alcohol or use drugs.   Family History:  The patient's  family history includes Arrhythmia in her brother and sister; Breast cancer in her maternal grandmother; Diabetes in her brother, mother, sister, and son; Glaucoma in her mother; Heart murmur in her brother and sister; Hypertension in her brother.   ROS:  Please see the history of present illness.   All other systems are personally reviewed and negative.    Exam:    Vital Signs:  BP - 148/84, P - 60   Labs/Other Tests and  Data Reviewed:    Recent Labs: 09/21/2017: ALT 18; BUN 22; Creatinine, Ser 0.97; Hemoglobin 14.2; Platelets 241.0; Potassium 4.0; Sodium 139; TSH 1.29   Wt Readings from Last 3 Encounters:  11/03/17 150 lb 6.4 oz (68.2 kg)  09/21/17 150 lb 6 oz (68.2 kg)  05/27/17 151 lb (68.5 kg)     Other studies personally reviewed:   ASSESSMENT & PLAN:    1.  PVC's - her symptoms are reasonably well controlled. She is encouraged to reduce her caffeine intake to one cup a day. 2. HTN - her blood pressure is ok. She will continue her coreg 3.Insomnia - she will try medical therapy. 4. COVID 19 screen The patient denies symptoms of COVID 19 at this time.  The importance of social distancing was discussed today.  Follow-up:  6/20 Next remote: n/a  Current medicines are reviewed at length with the patient today.   The patient does not have concerns regarding her medicines.  The following changes were made today:  none  Labs/ tests ordered today include: none No orders of the defined types were placed in this encounter.   Today, I have spent 25 minutes with the patient with telehealth technology discussing all of above .    Signed, Cristopher [place], MD  06/14/2018 11:50 AM     [address] [place] [address] [phone] (office) [phone] (fax)

## 2018-08-17 ENCOUNTER — Telehealth: Payer: Self-pay

## 2018-08-17 ENCOUNTER — Telehealth: Payer: Self-pay | Admitting: Internal Medicine

## 2018-08-17 MED ORDER — CARVEDILOL 6.25 MG PO TABS: 6.2500 mg | ORAL_TABLET | Freq: Two times a day (BID) | ORAL | 3 refills | Status: DC

## 2018-08-17 NOTE — Addendum Note (Signed)
Addended by: Willeen Cass A on: 08/17/2018 02:39 PM   Modules accepted: Orders

## 2018-08-17 NOTE — Telephone Encounter (Signed)
Per review of Pt chart, Pt is taking 6.25 mg carvedilol BID.  Corrected on Pt med list and resent to pharmacy.

## 2018-08-17 NOTE — Telephone Encounter (Signed)
[facility] is requesting a new Rx for Carvedilol 3.125 mg tablet, because the first Rx did not dispense enough tablets. Please clarify how many tablets pt is supposed to be taking per day and send the correct amount to pt's pharmacy. Thanks

## 2018-09-01 ENCOUNTER — Telehealth: Payer: Self-pay | Admitting: Internal Medicine

## 2018-09-01 NOTE — Telephone Encounter (Signed)
I spoke to the patient who accidentally took 2 (6.25 mg) Carvedilol at 10 am.  She feels fine, but wanted to know what to expect.  I told her that she should monitor BP, hydrate and rest today.   She verbalized understanding and will monitor.

## 2018-09-01 NOTE — Telephone Encounter (Signed)
New message   Pt c/o medication issue:  1. Name of Medication: carvedilol (COREG) 6.25 MG tablet  2. How are you currently taking this medication (dosage and times per day)? Take 1 tablet (6.25 mg total) by mouth 2 (two) times daily.  3. Are you having a reaction (difficulty breathing--STAT)?n/a  4. What is your medication issue? Patient's husband states that she accidentally took too much of this medication. Please advise.

## 2018-10-17 ENCOUNTER — Emergency Department
Admission: EM | Admit: 2018-10-17 | Discharge: 2018-10-17 | Disposition: A | Discharge status: Home / Self Care | Payer: Medicare HMO | Source: Home / Self Care | Attending: Family Medicine | Admitting: Family Medicine

## 2018-10-17 ENCOUNTER — Encounter: Payer: Self-pay | Admitting: Emergency Medicine

## 2018-10-17 ENCOUNTER — Other Ambulatory Visit: Payer: Self-pay

## 2018-10-17 DIAGNOSIS — R300 Dysuria: Secondary | ICD-10-CM

## 2018-10-17 DIAGNOSIS — N3090 Cystitis, unspecified without hematuria: Secondary | ICD-10-CM

## 2018-10-17 LAB — POCT URINALYSIS DIP (MANUAL ENTRY)
Bilirubin, UA: NEGATIVE
Blood, UA: NEGATIVE
Glucose, UA: NEGATIVE mg/dL
Ketones, POC UA: NEGATIVE mg/dL
Nitrite, UA: NEGATIVE
Protein Ur, POC: NEGATIVE mg/dL
Spec Grav, UA: 1.025 (ref 1.010–1.025)
Urobilinogen, UA: 0.2 U/dL
pH, UA: 7 (ref 5.0–8.0)

## 2018-10-17 MED ORDER — CIPROFLOXACIN HCL 250 MG PO TABS: 250.0000 mg | ORAL_TABLET | Freq: Two times a day (BID) | ORAL | 0 refills | Status: DC

## 2018-10-17 NOTE — Discharge Instructions (Addendum)
Increase fluid intake. °If symptoms become significantly worse during the night or over the weekend, proceed to the local emergency room.  °

## 2018-10-17 NOTE — ED Provider Notes (Signed)
Alisha Brown CARE    CSN: [id number] Arrival date & time: 10/17/18  1326      History   Chief Complaint Chief Complaint  Patient presents with  . Dysuria    HPI Alisha Brown is a 78 y.o. female.   One week ago patient developed mild lower abdominal cramps followed by dysuria.  She denies abdominal pain or fever.  The history is provided by the patient.  Dysuria Pain quality:  Burning Pain severity:  Mild Onset quality:  Sudden Duration:  6 days Timing:  Constant Progression:  Worsening Chronicity:  New Recent urinary tract infections: no   Relieved by:  None tried Worsened by:  Nothing Ineffective treatments:  None tried Urinary symptoms: frequent urination and hesitancy   Urinary symptoms: no discolored urine, no foul-smelling urine and no hematuria   Associated symptoms: no abdominal pain, no fever, no flank pain, no nausea, no vaginal discharge and no vomiting   Risk factors: no recurrent urinary tract infections     Past Medical History:  Diagnosis Date  . Allergy   . Anemia   . Anxiety   . Arrhythmia    heart  . CHF (congestive heart failure) ([place])   . Diverticulosis of colon (without mention of hemorrhage)   . GERD with stricture   . [id number])    consult dr Tomma Rakers visual migraine?  agg by HRT?  [name] Heart murmur   . Hiatal hernia   . Hyperlipidemia   . Hypertension   . Mitral valve disorder   . PVC (premature ventricular contraction)   . Seizures [facility])     Patient Active Problem List   Diagnosis Date Noted  . Chest pain 10/17/2016  . PVCs (premature ventricular contractions) 10/17/2016  . NSVT (nonsustained ventricular tachycardia) ([place]) 10/17/2016  . Lower abdominal pain 06/28/2014  . Elevated blood pressure reading 11/07/2013  . Facial pain 05/03/2013  . Face pain 03/22/2013  . Estrogen deficiency 03/22/2013  . Irritability 03/22/2013  . Allergic dermatitis 08/15/2012  . Palpitations 05/30/2012  . History of esophageal  stricture 05/30/2012  . Other specified cardiac dysrhythmias(427.89) 04/11/2012  . Elevated lipids 04/11/2012  . Abnormal thyroid exam 04/11/2012  . Vision changes  episodic  03/17/2012  . Menopausal hot flushes 03/17/2012  . Anxiety reaction 05/27/2011  . Sleep related teeth grinding 04/08/2011  . Menopausal syndrome (hot flashes) 04/08/2011  . Postmenopausal HRT (hormone replacement therapy) 04/08/2011  . Itching 04/08/2011  . Tinnitus 04/08/2011  . GERD (gastroesophageal reflux disease) 10/24/2010  . Dysuria 06/15/2010  . Cardiac dysrhythmia 03/23/2010  . Non-ischemic cardiomyopathy ([place]) 03/23/2010  . Visit for preventive health examination 03/23/2010  . HYPERGLYCEMIA 02/04/2009  . HIATAL HERNIA 10/17/2008  . GERD 11/18/2007  . OTHER DYSPHAGIA 11/18/2007  . MORTON'S NEUROMA 10/24/2007  . HEPATIC CYST 10/06/2007  . MUSCLE CRAMPS, FOOT 09/28/2007  . HYPERSOMNIA UNSPECIFIED 12/30/2006  . HYPERLIPIDEMIA 10/26/2006  . Essential hypertension 10/26/2006  . ALLERGIC RHINITIS 10/26/2006  . MENOPAUSE-RELATED VASOMOTOR SYMPTOMS, HOT FLASHES 10/26/2006  . DISORDER, BONE/CARTILAGE NOS 10/26/2006  . SEIZURE DISORDER 10/26/2006  . FATIGUE 10/26/2006  . CARDIAC MURMUR 10/26/2006  . Personal history of urinary disorder 10/26/2006    Past Surgical History:  Procedure Laterality Date  . ABDOMINAL HYSTERECTOMY  1987  . ADENOIDECTOMY    . APPENDECTOMY  1987  . BREAST EXCISIONAL BIOPSY    . BREAST SURGERY Right 1990   biopsy,benign  . CARDIAC CATHETERIZATION  2009   No CAD. LVEF 50%.  . COLONOSCOPY    .  ESOPHAGOGASTRODUODENOSCOPY    . FOOT SURGERY Right 2011  . RIGHT/LEFT HEART CATH AND CORONARY ANGIOGRAPHY N/A 12/10/2016   Procedure: RIGHT/LEFT HEART CATH AND CORONARY ANGIOGRAPHY;  Surgeon: Nelva Bush, MD;  Location: [facility];  Service: Cardiovascular;  Laterality: N/A;  . TONSILLECTOMY  1953  . TUBAL LIGATION      OB History    Gravida  1   Para  1   Term   1   Preterm      AB      Living  1     SAB      TAB      Ectopic      Multiple      Live Births               Home Medications    Prior to Admission medications   Medication Sig Start Date End Date Taking? Authorizing Provider  carvedilol (COREG) 6.25 MG tablet Take 1 tablet (6.25 mg total) by mouth 2 (two) times daily. 08/17/18   Evans Lance, MD  cholecalciferol (VITAMIN D) 1000 units tablet Take 1,000 Units every other day by mouth.    [provider]  ciprofloxacin (CIPRO) 250 MG tablet Take 1 tablet (250 mg total) by mouth 2 (two) times daily. 10/17/18   Kandra Nicolas, MD  famotidine (PEPCID) 20 MG tablet Take 20 mg by mouth 2 (two) times daily.    [provider]  ketotifen (ZADITOR) 0.025 % ophthalmic solution Place 1-2 drops 2 (two) times daily as needed into both eyes (for itchy eye).    [provider]  Magnesium 500 MG CAPS Take 500 mg daily by mouth.    [provider]  Methylsulfonylmethane (MSM) 1000 MG CAPS Take 1,000 mg daily by mouth.    [provider]  Ophthalmic Irrigation Solution (RA STERILE EYE [place] OP) Place 1 application daily into both eyes.    [provider]  vitamin B-12 (CYANOCOBALAMIN) 1000 MCG tablet Take 1,000 mcg every other day by mouth.    [provider]    Family History Family History  Problem Relation Age of Onset  . Diabetes Mother   . Glaucoma Mother   . Diabetes Son   . Diabetes Sister        1/2 sister-same mom  . Heart murmur Sister   . Arrhythmia Sister   . Breast cancer Maternal Grandmother   . Hypertension Brother   . Heart murmur Brother   . Arrhythmia Brother   . Diabetes Brother   . Colon cancer Neg Hx     Social History Social History   Tobacco Use  . Smoking status: Never Smoker  . Smokeless tobacco: Never Used  Substance Use Topics  . Alcohol use: No  . Drug use: No     Allergies   Contrast media [iodinated diagnostic agents],  Aleve [naproxen sodium], Augmentin [amoxicillin-pot clavulanate], Codeine, Gabapentin, Krill oil, and Propoxyphene n-acetaminophen   Review of Systems Review of Systems  Constitutional: Negative for chills and fever.  Gastrointestinal: Negative for abdominal pain, nausea and vomiting.  Genitourinary: Positive for dysuria, frequency and urgency. Negative for flank pain, hematuria and vaginal discharge.  All other systems reviewed and are negative.    Physical Exam Triage Vital Signs ED Triage Vitals  Enc Vitals Group     BP 10/17/18 1455 (!) 163/77     Pulse Rate 10/17/18 1455 71     Resp --  Temp 10/17/18 1455 98.5 F (36.9 C)     Temp Source 10/17/18 1455 Oral     SpO2 10/17/18 1455 97 %     Weight 10/17/18 1456 184 lb (83.5 kg)     Height 10/17/18 1456 5\' 5"  (1.651 m)     Head Circumference --      Peak Flow --      Pain Score 10/17/18 1456 8     Pain Loc --      Pain Edu? --      Excl. in [place]? --    No data found.  Updated Vital Signs BP (!) 163/77 (BP Location: Right Arm)   Pulse 71   Temp 98.5 F (36.9 C) (Oral)   Ht 5\' 5"  (1.651 m)   Wt 83.5 kg   SpO2 97%   BMI 30.62 kg/m   Visual Acuity Right Eye Distance:   Left Eye Distance:   Bilateral Distance:    Right Eye Near:   Left Eye Near:    Bilateral Near:     Physical Exam Nursing notes and Vital Signs reviewed. Appearance:  Patient appears stated age, and in no acute distress.    Eyes:  Pupils are equal, round, and reactive to light and accomodation.  Extraocular movement is intact.  Conjunctivae are not inflamed   Pharynx:  Normal; moist mucous membranes  Neck:  Supple.  No adenopathy Lungs:  Clear to auscultation.  Breath sounds are equal.  Moving air well. Heart:  Regular rate and rhythm without murmurs, rubs, or gallops.  Abdomen:  Nontender without masses or hepatosplenomegaly.  Bowel sounds are present.  No CVA or flank tenderness.  Extremities:  No edema.  Skin:  No rash present.     UC  Treatments / Results  Labs (all labs ordered are listed, but only abnormal results are displayed) Labs Reviewed  POCT URINALYSIS DIP (MANUAL ENTRY) - Abnormal; Notable for the following components:      Result Value   Leukocytes, UA Small (1+) (*)    All other components within normal limits  URINE CULTURE    EKG   Radiology No results found.  Procedures Procedures (including critical care time)  Medications Ordered in UC Medications - No data to display  Initial Impression / Assessment and Plan / UC Course  I have reviewed the triage vital signs and the nursing notes.  Pertinent labs & imaging results that were available during my care of the patient were reviewed by me and considered in my medical decision making (see chart for details).    Urine culture pending.  Review of chart records reveals a culture done 09/18/16 that showed e.coli resistant to several most desirable antibiotics, but sensitive to Cipro. Will begin Cipro 250mg  BID for five days. Followup with Family Doctor if not improved in 5 days.    Final Clinical Impressions(s) / UC Diagnoses   Final diagnoses:  Dysuria  Cystitis     Discharge Instructions     Increase fluid intake. If symptoms become significantly worse during the night or over the weekend, proceed to the local emergency room.     ED Prescriptions    Medication Sig Dispense Auth. Provider   ciprofloxacin (CIPRO) 250 MG tablet Take 1 tablet (250 mg total) by mouth 2 (two) times daily. 10 tablet Kandra Nicolas, MD        Kandra Nicolas, MD 10/20/18 [phone]

## 2018-10-17 NOTE — ED Triage Notes (Signed)
Dysuria X 6 days

## 2018-10-17 NOTE — ED Triage Notes (Signed)
Dysuria x 6 days

## 2018-10-19 LAB — URINE CULTURE
MICRO NUMBER:: 904169
SPECIMEN QUALITY:: ADEQUATE

## 2018-10-26 DIAGNOSIS — D2371 Other benign neoplasm of skin of right lower limb, including hip: Secondary | ICD-10-CM | POA: Diagnosis not present

## 2018-10-26 DIAGNOSIS — L570 Actinic keratosis: Secondary | ICD-10-CM | POA: Diagnosis not present

## 2018-10-26 DIAGNOSIS — Z23 Encounter for immunization: Secondary | ICD-10-CM | POA: Diagnosis not present

## 2018-10-26 DIAGNOSIS — L821 Other seborrheic keratosis: Secondary | ICD-10-CM | POA: Diagnosis not present

## 2018-10-26 DIAGNOSIS — D2271 Melanocytic nevi of right lower limb, including hip: Secondary | ICD-10-CM | POA: Diagnosis not present

## 2018-11-25 ENCOUNTER — Telehealth (INDEPENDENT_AMBULATORY_CARE_PROVIDER_SITE_OTHER): Payer: Medicare HMO | Admitting: Internal Medicine

## 2018-11-25 ENCOUNTER — Other Ambulatory Visit: Payer: Self-pay

## 2018-11-25 DIAGNOSIS — I493 Ventricular premature depolarization: Secondary | ICD-10-CM | POA: Diagnosis not present

## 2018-11-25 NOTE — Progress Notes (Signed)
Electrophysiology TeleHealth Note   Due to national recommendations of social distancing due to COVID 19, an audio/video telehealth visit is felt to be most appropriate for this patient at this time.  See MyChart message from today for the patient's consent to telehealth for [facility].   Date:  11/25/2018   ID:  Alisha Brown, Alisha Brown 11/07/1940, MRN [id number]  Location: patient's home  Provider location: [address] [place]  Evaluation Performed: Follow-up visit  PCP:  Burnis Medin, MD  Cardiologist:  Nelva Bush, MD  Electrophysiologist:  Dr Lovena Le  Chief Complaint:  "I have been dizzy."  History of Present Illness:    Alisha Brown is a 78 y.o. female who presents via audio/video conferencing for a telehealth visit today. The patient has a h/o HTN Since last being seen in our clinic, she has had problems with dizziness. When she checks her bp, she notes that her pressure is ok. Her HR is at times low making me wonder about more PVC's.  The patient denies symptoms of fevers, chills, cough, or new SOB worrisome for COVID 19.  Past Medical History:  Diagnosis Date  . Allergy   . Anemia   . Anxiety   . Arrhythmia    heart  . CHF (congestive heart failure) ([place])   . Diverticulosis of colon (without mention of hemorrhage)   . GERD with stricture   . [id number])    consult dr Tomma Rakers visual migraine?  agg by HRT?  [name] Heart murmur   . Hiatal hernia   . Hyperlipidemia   . Hypertension   . Mitral valve disorder   . PVC (premature ventricular contraction)   . Seizures ([place])     Past Surgical History:  Procedure Laterality Date  . ABDOMINAL HYSTERECTOMY  1987  . ADENOIDECTOMY    . APPENDECTOMY  1987  . BREAST EXCISIONAL BIOPSY    . BREAST SURGERY Right 1990   biopsy,benign  . CARDIAC CATHETERIZATION  2009   No CAD. LVEF 50%.  . COLONOSCOPY    . ESOPHAGOGASTRODUODENOSCOPY    . FOOT SURGERY Right 2011  . RIGHT/LEFT HEART CATH AND  CORONARY ANGIOGRAPHY N/A 12/10/2016   Procedure: RIGHT/LEFT HEART CATH AND CORONARY ANGIOGRAPHY;  Surgeon: Nelva Bush, MD;  Location: [facility];  Service: Cardiovascular;  Laterality: N/A;  . TONSILLECTOMY  1953  . TUBAL LIGATION      Current Outpatient Medications  Medication Sig Dispense Refill  . carvedilol (COREG) 6.25 MG tablet Take 1 tablet (6.25 mg total) by mouth 2 (two) times daily. 180 tablet 3  . cholecalciferol (VITAMIN D) 1000 units tablet Take 1,000 Units every other day by mouth.    . ciprofloxacin (CIPRO) 250 MG tablet Take 1 tablet (250 mg total) by mouth 2 (two) times daily. 10 tablet 0  . famotidine (PEPCID) 20 MG tablet Take 20 mg by mouth 2 (two) times daily.    [name] ketotifen (ZADITOR) 0.025 % ophthalmic solution Place 1-2 drops 2 (two) times daily as needed into both eyes (for itchy eye).    . Magnesium 500 MG CAPS Take 500 mg daily by mouth.    . Methylsulfonylmethane (MSM) 1000 MG CAPS Take 1,000 mg daily by mouth.    . Ophthalmic Irrigation Solution (RA STERILE EYE [place] OP) Place 1 application daily into both eyes.    . vitamin B-12 (CYANOCOBALAMIN) 1000 MCG tablet Take 1,000 mcg every other day by mouth.     No current  facility-administered medications for this visit.     Allergies:   Contrast media [iodinated diagnostic agents], Aleve [naproxen sodium], Augmentin [amoxicillin-pot clavulanate], Codeine, Gabapentin, Krill oil, and Propoxyphene n-acetaminophen   Social History:  The patient  reports that she has never smoked. She has never used smokeless tobacco. She reports that she does not drink alcohol or use drugs.   Family History:  The patient's  family history includes Arrhythmia in her brother and sister; Breast cancer in her maternal grandmother; Diabetes in her brother, mother, sister, and son; Glaucoma in her mother; Heart murmur in her brother and sister; Hypertension in her brother.   ROS:  Please see the history of present illness.   All  other systems are personally reviewed and negative.    Exam:    Vital Signs: BP - 130/74, P - 53   Labs/Other Tests and Data Reviewed:    Recent Labs: No results found for requested labs within last 8760 hours.   Wt Readings from Last 3 Encounters:  10/17/18 184 lb (83.5 kg)  11/03/17 150 lb 6.4 oz (68.2 kg)  09/21/17 150 lb 6 oz (68.2 kg)     Other studies personally reviewed: none  ASSESSMENT & PLAN:    1.  PVC's - the patient may be having more PVC's. I have asked her to repeat a 24 hour holter.  2. HTN - her bp is minimally elevated. 3. COVID 19 screen The patient denies symptoms of COVID 19 at this time.  The importance of social distancing was discussed today.  Follow-up:  Based on his 24 hour holter Next remote: n/a  Current medicines are reviewed at length with the patient today.   The patient does not have concerns regarding her medicines.  The following changes were made today:  none  Labs/ tests ordered today include: 24 hour holter No orders of the defined types were placed in this encounter.    Patient Risk:  after full review of this patients clinical status, I feel that they are at moderate risk at this time.  Today, I have spent 15 minutes with the patient with telehealth technology discussing all of the above .    Signed, Cristopher Peru, MD  11/25/2018 8:08 AM     [address] [place] [address] [phone] (office) [phone] (fax)

## 2018-11-29 NOTE — Addendum Note (Signed)
Addended by: Willeen Cass A on: 11/29/2018 02:38 PM   Modules accepted: Orders

## 2018-11-30 ENCOUNTER — Telehealth: Payer: Self-pay | Admitting: *Deleted

## 2018-11-30 NOTE — Telephone Encounter (Signed)
3 day ZIO XT long term holter monitor to be mailed to the patients home.  Patient aware she is required to wear the monitor at least 24 hours.  Instructions reviewed briefly as they are included in the monitor kit.

## 2018-12-07 ENCOUNTER — Ambulatory Visit (INDEPENDENT_AMBULATORY_CARE_PROVIDER_SITE_OTHER): Payer: Medicare HMO

## 2018-12-07 DIAGNOSIS — I493 Ventricular premature depolarization: Secondary | ICD-10-CM

## 2018-12-17 DIAGNOSIS — R002 Palpitations: Secondary | ICD-10-CM | POA: Diagnosis not present

## 2018-12-17 DIAGNOSIS — I493 Ventricular premature depolarization: Secondary | ICD-10-CM | POA: Diagnosis not present

## 2018-12-29 ENCOUNTER — Other Ambulatory Visit: Payer: Self-pay

## 2018-12-29 MED ORDER — CARVEDILOL 3.125 MG PO TABS: 3.1250 mg | ORAL_TABLET | Freq: Two times a day (BID) | ORAL | 3 refills | Status: DC

## 2018-12-29 NOTE — Progress Notes (Signed)
Order received from Dr. Lovena Le to reduce carvedilol to 3.125 mg by mouth twice a day.  Follow up in 6 months.

## 2019-03-16 ENCOUNTER — Telehealth: Payer: Self-pay | Admitting: Internal Medicine

## 2019-03-16 NOTE — Telephone Encounter (Signed)
Pt calling with questions regarding covid vaccine. States she is fearful with her multiple med  allergies and "Heart condition."  States mostly concerned regarding cardiac issues. Advised to contact cardiologist as would be best resource.   Pt states she will do so.

## 2019-03-23 ENCOUNTER — Ambulatory Visit: Payer: Medicare HMO | Attending: Internal Medicine

## 2019-03-23 DIAGNOSIS — Z23 Encounter for immunization: Secondary | ICD-10-CM | POA: Insufficient documentation

## 2019-03-23 NOTE — Progress Notes (Signed)
   Covid-19 Vaccination Clinic  Name:  Alisha Brown    MRN: [id number] DOB: [birth year]  03/23/2019  [name] was observed post Covid-19 immunization for 30 minutes based on pre-vaccination screening without incidence. She was provided with Vaccine Information Sheet and instruction to access the V-Safe system.   [name] was instructed to call 911 with any severe reactions post vaccine: [name] Difficulty breathing  . Swelling of your face and throat  . A fast heartbeat  . A bad rash all over your body  . Dizziness and weakness    Immunizations Administered    Name Date Dose VIS Date Route   Pfizer COVID-19 Vaccine 03/23/2019  1:21 PM 0.3 mL 01/06/2019 Intramuscular   Manufacturer: [place]   Lot: [id number]   [place]: [id number]

## 2019-04-18 ENCOUNTER — Ambulatory Visit: Payer: Medicare HMO | Attending: Internal Medicine

## 2019-04-18 DIAGNOSIS — Z23 Encounter for immunization: Secondary | ICD-10-CM

## 2019-04-18 NOTE — Progress Notes (Signed)
   Covid-19 Vaccination Clinic  Name:  Alisha Brown    MRN: [id number] DOB: 1940/10/06  04/18/2019  [name] was observed post Covid-19 immunization for 15 minutes without incident. She was provided with Vaccine Information Sheet and instruction to access the V-Safe system.   Ms. Ten was instructed to call 911 with any severe reactions post vaccine: [name] Difficulty breathing  . Swelling of face and throat  . A fast heartbeat  . A bad rash all over body  . Dizziness and weakness   Immunizations Administered    Name Date Dose VIS Date Route   Pfizer COVID-19 Vaccine 04/18/2019 12:22 PM 0.3 mL 01/06/2019 Intramuscular   Manufacturer: [place]   Lot: [id number]   [place]: [id number]

## 2019-04-21 ENCOUNTER — Other Ambulatory Visit: Payer: Self-pay | Admitting: Internal Medicine

## 2019-04-21 DIAGNOSIS — Z1231 Encounter for screening mammogram for malignant neoplasm of breast: Secondary | ICD-10-CM

## 2019-05-30 ENCOUNTER — Ambulatory Visit: Payer: Medicare HMO

## 2019-06-01 ENCOUNTER — Ambulatory Visit
Admission: RE | Admit: 2019-06-01 | Discharge: 2019-06-01 | Disposition: A | Discharge status: Home / Self Care | Payer: Medicare HMO | Source: Ambulatory Visit | Attending: Internal Medicine | Admitting: Internal Medicine

## 2019-06-01 ENCOUNTER — Other Ambulatory Visit: Payer: Self-pay

## 2019-06-01 DIAGNOSIS — Z1231 Encounter for screening mammogram for malignant neoplasm of breast: Secondary | ICD-10-CM

## 2019-08-10 ENCOUNTER — Encounter: Payer: Self-pay | Admitting: Internal Medicine

## 2019-08-10 ENCOUNTER — Ambulatory Visit: Payer: Medicare HMO | Admitting: Internal Medicine

## 2019-08-10 ENCOUNTER — Other Ambulatory Visit: Payer: Self-pay

## 2019-08-10 VITALS — BP 132/82 | HR 71 | Ht 65.0 in | Wt 151.0 lb

## 2019-08-10 DIAGNOSIS — I493 Ventricular premature depolarization: Secondary | ICD-10-CM | POA: Diagnosis not present

## 2019-08-10 DIAGNOSIS — I10 Essential (primary) hypertension: Secondary | ICD-10-CM | POA: Diagnosis not present

## 2019-08-10 DIAGNOSIS — I509 Heart failure, unspecified: Secondary | ICD-10-CM | POA: Diagnosis not present

## 2019-08-10 DIAGNOSIS — I110 Hypertensive heart disease with heart failure: Secondary | ICD-10-CM | POA: Diagnosis not present

## 2019-08-10 NOTE — Progress Notes (Signed)
HPI [name] returns today for followup. She is a pleasant 79 yo woman with a h/o PVC's and HTN, who has been on multiple meds in the past. She could not tolerate 6.25 bid of coreg so we reduced the dose to 3.125 bid and she has mostly done well. She has not had syncope. She has some dizzy spells. She notes that she is about to receive a new puppy and is quite excited. Her palpitations are still present but fairly well controlled. Allergies  Allergen Reactions   Contrast Media [Iodinated Diagnostic Agents] Hives    Hives/nausea   Aleve [Naproxen Sodium] Hives   Augmentin [Amoxicillin-Pot Clavulanate] Diarrhea and Nausea And Vomiting    Has patient had a PCN reaction causing immediate rash, facial/tongue/throat swelling, SOB or lightheadedness with hypotension: No Has patient had a PCN reaction causing severe rash involving mucus membranes or skin necrosis: No Has patient had a PCN reaction that required hospitalization: No Has patient had a PCN reaction occurring within the last 10 years: No--nausea/vomitng ONLY (pt believes dosage was too high) If all of the above answers are "NO", then may proceed with Cephalosporin use.    Codeine Nausea And Vomiting and Other (See Comments)    Dizziness.   Gabapentin Other (See Comments)    pseudo seizures.   Krill Oil Hives   Propoxyphene N-Acetaminophen     REACTION: hives,dizzy,gi upset     Current Outpatient Medications  Medication Sig Dispense Refill   famotidine (PEPCID) 20 MG tablet Take 20 mg by mouth daily.     ketotifen (ZADITOR) 0.025 % ophthalmic solution Place 1-2 drops 2 (two) times daily as needed into both eyes (for itchy eye).     Magnesium 500 MG CAPS Take 500 mg daily by mouth.     Methylsulfonylmethane (MSM) 1000 MG CAPS Take 1,000 mg daily by mouth.     Ophthalmic Irrigation Solution (RA STERILE EYE [place] OP) Place 1 application daily into both eyes.     vitamin B-12 (CYANOCOBALAMIN) 1000 MCG tablet  Take 1,000 mcg every other day by mouth.     VITAMIN D, CHOLECALCIFEROL, PO Take 10,000 Units by mouth daily.     carvedilol (COREG) 3.125 MG tablet Take 1 tablet (3.125 mg total) by mouth 2 (two) times daily. 180 tablet 3   No current facility-administered medications for this visit.     Past Medical History:  Diagnosis Date   Allergy    Anemia    Anxiety    Arrhythmia    heart   CHF (congestive heart failure) ([facility])    Diverticulosis of colon (without mention of hemorrhage)    GERD with stricture    Headache(784.0)    consult dr Tomma Rakers visual migraine?  agg by HRT?   Heart murmur    Hiatal hernia    Hyperlipidemia    Hypertension    Mitral valve disorder    PVC (premature ventricular contraction)    Seizures ([facility])     ROS:   All systems reviewed and negative except as noted in the HPI.   Past Surgical History:  Procedure Laterality Date   ABDOMINAL HYSTERECTOMY  1987   ADENOIDECTOMY     APPENDECTOMY  1987   BREAST EXCISIONAL BIOPSY Right 1993   BREAST SURGERY Right 1990   biopsy,benign   CARDIAC CATHETERIZATION  2009   No CAD. LVEF 50%.   COLONOSCOPY     ESOPHAGOGASTRODUODENOSCOPY     FOOT SURGERY Right 2011   RIGHT/LEFT  HEART CATH AND CORONARY ANGIOGRAPHY N/A 12/10/2016   Procedure: RIGHT/LEFT HEART CATH AND CORONARY ANGIOGRAPHY;  Surgeon: Nelva Bush, MD;  Location: [facility];  Service: Cardiovascular;  Laterality: N/A;   TONSILLECTOMY  1953   TUBAL LIGATION       Family History  Problem Relation Age of Onset   Diabetes Mother    Glaucoma Mother    Diabetes Son    Diabetes Sister        1/2 sister-same mom   Heart murmur Sister    Arrhythmia Sister    Breast cancer Maternal Grandmother    Hypertension Brother    Heart murmur Brother    Arrhythmia Brother    Diabetes Brother    Colon cancer Neg Hx      Social History   Socioeconomic History   Marital status: Married    Spouse name: Not  on file   Number of children: 1   Years of education: Not on file   Highest education level: Not on file  Occupational History   Occupation: retired  Tobacco Use   Smoking status: Never Smoker   Smokeless tobacco: Never Used  Scientific laboratory technician Use: Never used  Substance and Sexual Activity   Alcohol use: No   Drug use: No   Sexual activity: Not Currently    Birth control/protection: None  Other Topics Concern   Not on file  Social History Narrative   HHof 2 dog    Just  Died   [name]    Retired and married   [name]      Dog with health issues  Cooks for him bladder stones   Social Determinants of Health   Financial Resource Strain:    Difficulty of Paying Living Expenses:   Food Insecurity:    Worried About Charity fundraiser in the Last Year:    Arboriculturist in the Last Year:   Transportation Needs:    Film/video editor (Medical):    Lack of Transportation (Non-Medical):   Physical Activity:    Days of Exercise per Week:    Minutes of Exercise per Session:   Stress:    Feeling of Stress :   Social Connections:    Frequency of Communication with Friends and Family:    Frequency of Social Gatherings with Friends and Family:    Attends Religious Services:    Active Member of Clubs or Organizations:    Attends Music therapist:    Marital Status:   Intimate Partner Violence:    Fear of Current or Ex-Partner:    Emotionally Abused:    Physically Abused:    Sexually Abused:      BP 132/82    Pulse 71    Ht 5\' 5"  (1.651 m)    Wt 151 lb (68.5 kg)    SpO2 98%    BMI 25.13 kg/m   Physical Exam:  Well appearing NAD HEENT: Unremarkable Neck:  No JVD, no thyromegally Lymphatics:  No adenopathy Back:  No CVA tenderness Lungs:  Clear with no wheezes HEART:  Regular rate rhythm, no murmurs, no rubs, no clicks Abd:  soft, positive bowel sounds, no organomegally, no rebound, no guarding Ext:  2 plus pulses, no edema,  no cyanosis, no clubbing Skin:  No rashes no nodules Neuro:  CN II through XII intact, motor grossly intact  EKG - nsr with rare PVC's   Assess/Plan: 1. PVC's - we discussed stopping her  coreg but after additional review, decided to continue low dose coreg.  2. HTN - her bp is well controlled. She will continue her current meds.  Mikle Bosworth.D.

## 2019-08-10 NOTE — Patient Instructions (Addendum)

## 2019-12-05 DIAGNOSIS — L570 Actinic keratosis: Secondary | ICD-10-CM | POA: Diagnosis not present

## 2019-12-05 DIAGNOSIS — L821 Other seborrheic keratosis: Secondary | ICD-10-CM | POA: Diagnosis not present

## 2019-12-05 DIAGNOSIS — L578 Other skin changes due to chronic exposure to nonionizing radiation: Secondary | ICD-10-CM | POA: Diagnosis not present

## 2020-02-06 ENCOUNTER — Other Ambulatory Visit (HOSPITAL_BASED_OUTPATIENT_CLINIC_OR_DEPARTMENT_OTHER): Payer: Self-pay | Admitting: Internal Medicine

## 2020-02-06 ENCOUNTER — Ambulatory Visit: Payer: Medicare HMO | Attending: Internal Medicine

## 2020-02-06 DIAGNOSIS — Z23 Encounter for immunization: Secondary | ICD-10-CM

## 2020-02-06 MED FILL — PFIZER-BIONTECH COVID-19 VA: 30 | 21 days supply | Qty: 0 | Fill #0

## 2020-02-06 NOTE — Progress Notes (Signed)
   Covid-19 Vaccination Clinic  Name:  Alisha Brown    MRN: [id number] DOB: Jun 24, 1940  02/06/2020  Alisha Brown was observed post Covid-19 immunization for 15 minutes without incident. She was provided with Vaccine Information Sheet and instruction to access the V-Safe system.   Alisha Brown was instructed to call 911 with any severe reactions post vaccine: [name] Difficulty breathing  . Swelling of face and throat  . A fast heartbeat  . A bad rash all over body  . Dizziness and weakness   Immunizations Administered    Name Date Dose VIS Date Route   Pfizer COVID-19 Vaccine 02/06/2020 10:58 AM 0.3 mL 11/15/2019 Intramuscular   Manufacturer: [place]   Lot: [id number]   [place]: [id number]

## 2020-02-23 ENCOUNTER — Telehealth: Payer: Self-pay | Admitting: Internal Medicine

## 2020-02-23 NOTE — Telephone Encounter (Signed)
Left message for patient to call back and schedule Medicare Annual Wellness Visit (AWV) either virtually or in office.   Last AWV no information   please schedule at anytime with [facility] Nurse Health Advisor 1 or 2   This should be a 45 minute visit.  Patient also needs appointment with pcp  Last appointment 08/2017

## 2020-02-29 ENCOUNTER — Ambulatory Visit: Payer: Medicare HMO | Admitting: Internal Medicine

## 2020-02-29 ENCOUNTER — Other Ambulatory Visit: Payer: Self-pay

## 2020-02-29 ENCOUNTER — Encounter: Payer: Self-pay | Admitting: Internal Medicine

## 2020-02-29 VITALS — BP 122/74 | HR 94 | Ht 65.0 in | Wt 152.6 lb

## 2020-02-29 DIAGNOSIS — I10 Essential (primary) hypertension: Secondary | ICD-10-CM | POA: Diagnosis not present

## 2020-02-29 DIAGNOSIS — I110 Hypertensive heart disease with heart failure: Secondary | ICD-10-CM | POA: Diagnosis not present

## 2020-02-29 DIAGNOSIS — I493 Ventricular premature depolarization: Secondary | ICD-10-CM

## 2020-02-29 DIAGNOSIS — I509 Heart failure, unspecified: Secondary | ICD-10-CM | POA: Diagnosis not present

## 2020-02-29 NOTE — Progress Notes (Signed)
HPI [name] returns today for followup. She is a pleasant 80 yo woman with a h/o PVC's and HTN, who has been on multiple meds in the past. She could not tolerate 6.25 bid of coreg so we reduced the dose to 3.125 bid and then we stopped coreg. She has felt better since then.  She has not had syncope. She has some dizzy spells. She notes that she has gotten a new puppy and  quite excited. Her palpitations are still present but fairly well controlled.  Allergies  Allergen Reactions  . Contrast Media [Iodinated Diagnostic Agents] Hives    Hives/nausea  . Aleve [Naproxen Sodium] Hives  . Augmentin [Amoxicillin-Pot Clavulanate] Diarrhea and Nausea And Vomiting    Has patient had a PCN reaction causing immediate rash, facial/tongue/throat swelling, SOB or lightheadedness with hypotension: No Has patient had a PCN reaction causing severe rash involving mucus membranes or skin necrosis: No Has patient had a PCN reaction that required hospitalization: No Has patient had a PCN reaction occurring within the last 10 years: No--nausea/vomitng ONLY (pt believes dosage was too high) If all of the above answers are "NO", then may proceed with Cephalosporin use.   . Codeine Nausea And Vomiting and Other (See Comments)    Dizziness.  . Gabapentin Other (See Comments)    pseudo seizures.  Alisha Brown Hives  . Propoxyphene N-Acetaminophen     REACTION: hives,dizzy,gi upset     Current Outpatient Medications  Medication Sig Dispense Refill  . famotidine (PEPCID) 20 MG tablet Take 20 mg by mouth daily.    [name] ketotifen (ZADITOR) 0.025 % ophthalmic solution Place 1-2 drops 2 (two) times daily as needed into both eyes (for itchy eye).    . Magnesium 500 MG CAPS Take 500 mg daily by mouth.    . Methylsulfonylmethane (MSM) 1000 MG CAPS Take 1,000 mg daily by mouth.    . Ophthalmic Irrigation Solution (RA STERILE EYE [place] OP) Place 1 application daily into both eyes.    . vitamin B-12  (CYANOCOBALAMIN) 1000 MCG tablet Take 1,000 mcg every other day by mouth.    [name] VITAMIN D, CHOLECALCIFEROL, PO Take 10,000 Units by mouth daily.     No current facility-administered medications for this visit.     Past Medical History:  Diagnosis Date  . Allergy   . Anemia   . Anxiety   . Arrhythmia    heart  . CHF (congestive heart failure) ([place])   . Diverticulosis of colon (without mention of hemorrhage)   . GERD with stricture   . IONGEXBM(841.3)    consult dr Tomma Rakers visual migraine?  agg by HRT?  [name] Heart murmur   . Hiatal hernia   . Hyperlipidemia   . Hypertension   . Mitral valve disorder   . PVC (premature ventricular contraction)   . Seizures ([place])     ROS:   All systems reviewed and negative except as noted in the HPI.   Past Surgical History:  Procedure Laterality Date  . ABDOMINAL HYSTERECTOMY  1987  . ADENOIDECTOMY    . APPENDECTOMY  1987  . BREAST EXCISIONAL BIOPSY Right 1993  . BREAST SURGERY Right 1990   biopsy,benign  . CARDIAC CATHETERIZATION  2009   No CAD. LVEF 50%.  . COLONOSCOPY    . ESOPHAGOGASTRODUODENOSCOPY    . FOOT SURGERY Right 2011  . RIGHT/LEFT HEART CATH AND CORONARY ANGIOGRAPHY N/A 12/10/2016   Procedure: RIGHT/LEFT HEART CATH AND CORONARY ANGIOGRAPHY;  Surgeon: Nelva Bush, MD;  Location: [facility];  Service: Cardiovascular;  Laterality: N/A;  . TONSILLECTOMY  1953  . TUBAL LIGATION       Family History  Problem Relation Age of Onset  . Diabetes Mother   . Glaucoma Mother   . Diabetes Son   . Diabetes Sister        1/2 sister-same mom  . Heart murmur Sister   . Arrhythmia Sister   . Breast cancer Maternal Grandmother   . Hypertension Brother   . Heart murmur Brother   . Arrhythmia Brother   . Diabetes Brother   . Colon cancer Neg Hx      Social History   Socioeconomic History  . Marital status: Married    Spouse name: Not on file  . Number of children: 1  . Years of education: Not on file  . Highest  education level: Not on file  Occupational History  . Occupation: retired  Tobacco Use  . Smoking status: Never Smoker  . Smokeless tobacco: Never Used  Vaping Use  . Vaping Use: Never used  Substance and Sexual Activity  . Alcohol use: No  . Drug use: No  . Sexual activity: Not Currently    Birth control/protection: None  Other Topics Concern  . Not on file  Social History Narrative   HHof 2 dog    Just  Died   [name]    Retired and married   [name]      Dog with health issues  Cooks for him bladder stones   Social Determinants of Health   Financial Resource Strain: Not on file  Food Insecurity: Not on file  Transportation Needs: Not on file  Physical Activity: Not on file  Stress: Not on file  Social Connections: Not on file  Intimate Partner Violence: Not on file     BP 122/74   Pulse 94   Ht 5\' 5"  (1.651 m)   Wt 152 lb 9.6 oz (69.2 kg)   SpO2 97%   BMI 25.39 kg/m   Physical Exam:  Well appearing 80 yo woman, NAD HEENT: Unremarkable Neck:  No JVD, no thyromegally Lymphatics:  No adenopathy Back:  No CVA tenderness Lungs:  Clear with no wheezes HEART:  Regular rate rhythm, no murmurs, no rubs, no clicks Abd:  soft, positive bowel sounds, no organomegally, no rebound, no guarding Ext:  2 plus pulses, no edema, no cyanosis, no clubbing Skin:  No rashes no nodules Neuro:  CN II through XII intact, motor grossly intact  EKG - NSR with frequent multifocal PVC's  Assess/Plan: 1. PVC's - she has stopped her beta blocker but her symptoms are controlled.  2. HTN - her bp is well controlled. She will continue her current meds.  Carleene Overlie Alisha Pe,MD

## 2020-02-29 NOTE — Patient Instructions (Signed)

## 2020-04-24 DIAGNOSIS — Z1231 Encounter for screening mammogram for malignant neoplasm of breast: Secondary | ICD-10-CM

## 2020-05-30 ENCOUNTER — Other Ambulatory Visit: Payer: Self-pay | Admitting: Internal Medicine

## 2020-05-30 DIAGNOSIS — Z1231 Encounter for screening mammogram for malignant neoplasm of breast: Secondary | ICD-10-CM

## 2020-06-05 ENCOUNTER — Other Ambulatory Visit: Payer: Self-pay

## 2020-06-05 ENCOUNTER — Ambulatory Visit (INDEPENDENT_AMBULATORY_CARE_PROVIDER_SITE_OTHER): Payer: Medicare HMO

## 2020-06-05 DIAGNOSIS — Z1231 Encounter for screening mammogram for malignant neoplasm of breast: Secondary | ICD-10-CM

## 2020-06-27 ENCOUNTER — Telehealth: Payer: Self-pay | Admitting: Internal Medicine

## 2020-06-27 NOTE — Telephone Encounter (Signed)
Left message for patient to call back and schedule Medicare Annual Wellness Visit (AWV) either virtually or in office.    AWV-I PER [place] 01/26/09  please schedule at anytime with [facility] Nurse Health Advisor 1 or 2  Patient also needs appointment with PCP last appointment 09/21/17  This should be a 45 minute visit.

## 2020-07-02 ENCOUNTER — Ambulatory Visit (INDEPENDENT_AMBULATORY_CARE_PROVIDER_SITE_OTHER): Payer: Medicare HMO

## 2020-07-02 ENCOUNTER — Other Ambulatory Visit: Payer: Self-pay

## 2020-07-02 DIAGNOSIS — Z780 Asymptomatic menopausal state: Secondary | ICD-10-CM

## 2020-07-02 DIAGNOSIS — Z0000 Encounter for general adult medical examination without abnormal findings: Secondary | ICD-10-CM | POA: Diagnosis not present

## 2020-07-02 NOTE — Progress Notes (Signed)
Subjective:   Alisha Brown is a 80 y.o. female who presents for Medicare Annual (Subsequent) preventive examination.  I connected with Moon Budde  today by telephone and verified that I am speaking with the correct person using two identifiers. Location patient: home Location provider: work Persons participating in the virtual visit: patient, provider.   I discussed the limitations, risks, security and privacy concerns of performing an evaluation and management service by telephone and the availability of in person appointments. I also discussed with the patient that there may be a patient responsible charge related to this service. The patient expressed understanding and verbally consented to this telephonic visit.    Interactive audio and video telecommunications were attempted between this provider and patient, however failed, due to patient having technical difficulties OR patient did not have access to video capability.  We continued and completed visit with audio only.    Review of Systems    n/a       Objective:    There were no vitals filed for this visit. There is no height or weight on file to calculate BMI.  Advanced Directives 12/10/2016  Does Patient Have a Medical Advance Directive? No  Would patient like information on creating a medical advance directive? No - Patient declined    Current Medications (verified) Outpatient Encounter Medications as of 07/02/2020  Medication Sig  . COVID-19 mRNA vaccine, Pfizer, 30 MCG/0.3ML injection INJECT AS DIRECTED  . famotidine (PEPCID) 20 MG tablet Take 20 mg by mouth daily.  [name] ketotifen (ZADITOR) 0.025 % ophthalmic solution Place 1-2 drops 2 (two) times daily as needed into both eyes (for itchy eye).  . Magnesium 500 MG CAPS Take 500 mg daily by mouth.  . Methylsulfonylmethane (MSM) 1000 MG CAPS Take 1,000 mg daily by mouth.  . Ophthalmic Irrigation Solution (RA STERILE EYE [place] OP) Place 1 application daily into  both eyes.  . vitamin B-12 (CYANOCOBALAMIN) 1000 MCG tablet Take 1,000 mcg every other day by mouth.  [name] VITAMIN D, CHOLECALCIFEROL, PO Take 10,000 Units by mouth daily.   No facility-administered encounter medications on file as of 07/02/2020.    Allergies (verified) Contrast media [iodinated diagnostic agents], Aleve [naproxen sodium], Augmentin [amoxicillin-pot clavulanate], Codeine, Gabapentin, Krill oil, and Propoxyphene n-acetaminophen   History: Past Medical History:  Diagnosis Date  . Allergy   . Anemia   . Anxiety   . Arrhythmia    heart  . CHF (congestive heart failure) ([place])   . Diverticulosis of colon (without mention of hemorrhage)   . GERD with stricture   . DZHGDJME(268.3)    consult dr Tomma Rakers visual migraine?  agg by HRT?  [name] Heart murmur   . Hiatal hernia   . Hyperlipidemia   . Hypertension   . Mitral valve disorder   . PVC (premature ventricular contraction)   . Seizures ([place])    Past Surgical History:  Procedure Laterality Date  . ABDOMINAL HYSTERECTOMY  1987  . ADENOIDECTOMY    . APPENDECTOMY  1987  . BREAST EXCISIONAL BIOPSY Right 1993  . BREAST SURGERY Right 1990   biopsy,benign  . CARDIAC CATHETERIZATION  2009   No CAD. LVEF 50%.  . COLONOSCOPY    . ESOPHAGOGASTRODUODENOSCOPY    . FOOT SURGERY Right 2011  . RIGHT/LEFT HEART CATH AND CORONARY ANGIOGRAPHY N/A 12/10/2016   Procedure: RIGHT/LEFT HEART CATH AND CORONARY ANGIOGRAPHY;  Surgeon: Nelva Bush, MD;  Location: [facility];  Service: Cardiovascular;  Laterality: N/A;  . TONSILLECTOMY  1953  . TUBAL LIGATION     Family History  Problem Relation Age of Onset  . Diabetes Mother   . Glaucoma Mother   . Diabetes Son   . Diabetes Sister        1/2 sister-same mom  . Heart murmur Sister   . Arrhythmia Sister   . Breast cancer Maternal Grandmother   . Hypertension Brother   . Heart murmur Brother   . Arrhythmia Brother   . Diabetes Brother   . Colon cancer Neg Hx    Social  History   Socioeconomic History  . Marital status: Married    Spouse name: Not on file  . Number of children: 1  . Years of education: Not on file  . Highest education level: Not on file  Occupational History  . Occupation: retired  Tobacco Use  . Smoking status: Never Smoker  . Smokeless tobacco: Never Used  Vaping Use  . Vaping Use: Never used  Substance and Sexual Activity  . Alcohol use: No  . Drug use: No  . Sexual activity: Not Currently    Birth control/protection: None  Other Topics Concern  . Not on file  Social History Narrative   HHof 2 dog    Just  Died   [name]    Retired and married   [name]      Dog with health issues  Cooks for him bladder stones   Social Determinants of Health   Financial Resource Strain: Not on file  Food Insecurity: Not on file  Transportation Needs: Not on file  Physical Activity: Not on file  Stress: Not on file  Social Connections: Not on file    Tobacco Counseling Counseling given: Not Answered   Clinical Intake:                 Diabetic?no         Activities of Daily Living No flowsheet data found.  Patient Care Team: Panosh, Standley Brooking, MD as PCP - General End, Harrell Gave, MD as PCP - Cardiology (Cardiology) Evans Lance, MD (Cardiology) Jari Pigg, MD (Dermatology) DUNN  for eye exams   Indicate any recent Medical Services you may have received from other than [facility] providers in the past year (date may be approximate).     Assessment:   This is a routine wellness examination for Alisha Brown.  Hearing/Vision screen No exam data present  Dietary issues and exercise activities discussed:    Goals Addressed   None    Depression Screen PHQ 2/9 Scores 09/21/2017 09/18/2016 05/03/2013  PHQ - 2 Score 0 0 0    Fall Risk Fall Risk  09/21/2017 09/18/2016 05/03/2013  Falls in the past year? No No No    FALL RISK PREVENTION PERTAINING TO THE HOME:  Any stairs in or around the home? Yes  If so, are there  any without handrails? Yes  Home free of loose throw rugs in walkways, pet beds, electrical cords, etc? Yes  Adequate lighting in your home to reduce risk of falls? Yes   ASSISTIVE DEVICES UTILIZED TO PREVENT FALLS:  Life alert? No  Use of a cane, walker or w/c? No  Grab bars in the bathroom? No  Shower chair or bench in shower? Yes  Elevated toilet seat or a handicapped toilet? Yes   Cognitive Function: Normal cognitive status assessed by direct observation by this Nurse Health Advisor. No abnormalities found.          Immunizations Immunization History  Administered Date(s) Administered  . Influenza Split 12/01/2011  . Influenza Whole 02/04/2009, 10/30/2009  . Influenza, High Dose Seasonal PF 11/24/2012, 11/28/2013, 12/01/2017  . Influenza,inj,Quad PF,6+ Mos 03/01/2017  . PFIZER(Purple Top)SARS-COV-2 Vaccination 03/23/2019, 04/18/2019, 02/06/2020  . Pneumococcal Conjugate-13 05/03/2013  . Pneumococcal Polysaccharide-23 03/19/2010  . Td 01/27/2003  . Tdap 09/25/2011  . Zoster, Live 08/26/2010    TDAP status: Up to date  Flu Vaccine status: Up to date  Pneumococcal vaccine status: Up to date  Covid-19 vaccine status: Completed vaccines  Qualifies for Shingles Vaccine? Yes   Zostavax completed No   Shingrix Completed?: No.    Education has been provided regarding the importance of this vaccine. Patient has been advised to call insurance company to determine out of pocket expense if they have not yet received this vaccine. Advised may also receive vaccine at local pharmacy or Health Dept. Verbalized acceptance and understanding.  Screening Tests Health Maintenance  Topic Date Due  . Pneumococcal Vaccine 60-39 Years old (1 of 4 - PCV13) Never done  . Zoster Vaccines- Shingrix (1 of 2) Never done  . INFLUENZA VACCINE  08/26/2020  . TETANUS/TDAP  09/24/2021  . DEXA SCAN  Completed  . COVID-19 Vaccine  Completed  . Hepatitis C Screening  Completed  . PNA vac Low Risk  Adult  Completed  . HPV VACCINES  Aged Out    Health Maintenance  Health Maintenance Due  Topic Date Due  . Pneumococcal Vaccine 25-3 Years old (1 of 4 - PCV13) Never done  . Zoster Vaccines- Shingrix (1 of 2) Never done    Colorectal cancer screening: Type of screening: Colonoscopy. Completed 01/25/2012. Repeat every 5 years  Mammogram status: Completed 06/05/2020. Repeat every year  Bone Density status: Ordered 07/02/2020. Pt provided with contact info and advised to call to schedule appt.  Lung Cancer Screening: (Low Dose CT Chest recommended if Age 1-80 years, 30 pack-year currently smoking OR have quit w/in 15years.) does not qualify.   Lung Cancer Screening Referral: n/a  Additional Screening:  Hepatitis C Screening: does not qualify  Vision Screening: Recommended annual ophthalmology exams for early detection of glaucoma and other disorders of the eye. Is the patient up to date with their annual eye exam?  Yes  Who is the provider or what is the name of the office in which the patient attends annual eye exams? Dr.Dunn If pt is not established with a provider, would they like to be referred to a provider to establish care? No .   Dental Screening: Recommended annual dental exams for proper oral hygiene  Community Resource Referral / Chronic Care Management: CRR required this visit?  No   CCM required this visit?  No      Plan:     I have personally reviewed and noted the following in the patient's chart:   . Medical and social history . Use of alcohol, tobacco or illicit drugs  . Current medications and supplements including opioid prescriptions.  . Functional ability and status . Nutritional status . Physical activity . Advanced directives . List of other physicians . Hospitalizations, surgeries, and ER visits in previous 12 months . Vitals . Screenings to include cognitive, depression, and falls . Referrals and appointments  In addition, I have  reviewed and discussed with patient certain preventive protocols, quality metrics, and best practice recommendations. A written personalized care plan for preventive services as well as general preventive health recommendations were provided to patient.     Randel Pigg,  LPN   05/01/2701   Nurse Notes: none

## 2020-07-02 NOTE — Patient Instructions (Addendum)
Alisha Brown , Thank you for taking time to come for your Medicare Wellness Visit. I appreciate your ongoing commitment to your health goals. Please review the following plan we discussed and let me know if I can assist you in the future.   Screening recommendations/referrals: Colonoscopy: pt declines  Mammogram: 06/05/2020 Bone Density: referral 07/02/2020 Recommended yearly ophthalmology/optometry visit for glaucoma screening and checkup Recommended yearly dental visit for hygiene and checkup  Vaccinations: Influenza vaccine: due in fall 2022 Pneumococcal vaccine: completed  series  Tdap vaccine:  Current due 2023  Shingles vaccine: pt declined   Advanced directives: will provide copies   Conditions/risks identified: none    Next appointment: 07/24/2020  @300pm  Dr. Regis Bill     how often you should have your eyes checked.  Personal lifestyle choices, including:  Daily care of your teeth and gums.  Regular physical activity.  Eating a healthy diet.  Avoiding tobacco and drug use.  Limiting alcohol use.  Practicing safe sex.  Taking low-dose aspirin every day.  Taking vitamin and mineral supplements as recommended by your health care provider. What happens during an annual well check? The services and screenings done by your health care provider during your annual well check will depend on your age, overall health, lifestyle risk factors, and family history of disease. Counseling  Your health care provider may ask you questions about your:  Alcohol use.  Tobacco use.  Drug use.  Emotional well-being.  Home and relationship well-being.  Sexual activity.  Eating habits.  History of falls.  Memory and ability to understand (cognition).  Work and work Statistician.  Reproductive health. Screening  You may have the following tests or measurements:  Height, weight, and BMI.  Blood pressure.  Lipid and cholesterol levels. These may be checked every 5  years, or more frequently if you are over 79 years old.  Skin check.  Lung cancer screening. You may have this screening every year starting at age 98 if you have a 30-pack-year history of smoking and currently smoke or have quit within the past 15 years.  Fecal occult blood test (FOBT) of the stool. You may have this test every year starting at age 60.  Flexible sigmoidoscopy or colonoscopy. You may have a sigmoidoscopy every 5 years or a colonoscopy every 10 years starting at age 76.  Hepatitis C blood test.  Hepatitis B blood test.  Sexually transmitted disease (STD) testing.  Diabetes screening. This is done by checking your blood sugar (glucose) after you have not eaten for a while (fasting). You may have this done every 1-3 years.  Bone density scan. This is done to screen for osteoporosis. You may have this done starting at age 32.  Mammogram. This may be done every 1-2 years. Talk to your health care provider about how often you should have regular mammograms. Talk with your health care provider about your test results, treatment options, and if necessary, the need for more tests. Vaccines  Your health care provider may recommend certain vaccines, such as:  Influenza vaccine. This is recommended every year.  Tetanus, diphtheria, and acellular pertussis (Tdap, Td) vaccine. You may need a Td booster every 10 years.  Zoster vaccine. You may need this after age 43.  Pneumococcal 13-valent conjugate (PCV13) vaccine. One dose is recommended after age 12.  Pneumococcal polysaccharide (PPSV23) vaccine. One dose is recommended after age 51. Talk to your health care provider about which screenings and vaccines you need and how often you need them.  This information is not intended to replace advice given to you by your health care provider. Make sure you discuss any questions you have with your health care provider. Document Released: 02/08/2015 Document Revised: 10/02/2015 Document  Reviewed: 11/13/2014 Elsevier Interactive Patient Education  2017 [place] Prevention in the Home Falls can cause injuries. They can happen to people of all ages. There are many things you can do to make your home safe and to help prevent falls. What can I do on the outside of my home?  Regularly fix the edges of walkways and driveways and fix any cracks.  Remove anything that might make you trip as you walk through a door, such as a raised step or threshold.  Trim any bushes or trees on the path to your home.  Use bright outdoor lighting.  Clear any walking paths of anything that might make someone trip, such as rocks or tools.  Regularly check to see if handrails are loose or broken. Make sure that both sides of any steps have handrails.  Any raised decks and porches should have guardrails on the edges.  Have any leaves, snow, or ice cleared regularly.  Use sand or salt on walking paths during winter.  Clean up any spills in your garage right away. This includes oil or grease spills. What can I do in the bathroom?  Use night lights.  Install grab bars by the toilet and in the tub and shower. Do not use towel bars as grab bars.  Use non-skid mats or decals in the tub or shower.  If you need to sit down in the shower, use a plastic, non-slip stool.  Keep the floor dry. Clean up any water that spills on the floor as soon as it happens.  Remove soap buildup in the tub or shower regularly.  Attach bath mats securely with double-sided non-slip rug tape.  Do not have throw rugs and other things on the floor that can make you trip. What can I do in the bedroom?  Use night lights.  Make sure that you have a light by your bed that is easy to reach.  Do not use any sheets or blankets that are too big for your bed. They should not hang down onto the floor.  Have a firm chair that has side arms. You can use this for support while you get dressed.  Do not have  throw rugs and other things on the floor that can make you trip. What can I do in the kitchen?  Clean up any spills right away.  Avoid walking on wet floors.  Keep items that you use a lot in easy-to-reach places.  If you need to reach something above you, use a strong step stool that has a grab bar.  Keep electrical cords out of the way.  Do not use floor polish or wax that makes floors slippery. If you must use wax, use non-skid floor wax.  Do not have throw rugs and other things on the floor that can make you trip. What can I do with my stairs?  Do not leave any items on the stairs.  Make sure that there are handrails on both sides of the stairs and use them. Fix handrails that are broken or loose. Make sure that handrails are as long as the stairways.  Check any carpeting to make sure that it is firmly attached to the stairs. Fix any carpet that is loose or worn.  Avoid having  throw rugs at the top or bottom of the stairs. If you do have throw rugs, attach them to the floor with carpet tape.  Make sure that you have a light switch at the top of the stairs and the bottom of the stairs. If you do not have them, ask someone to add them for you. What else can I do to help prevent falls?  Wear shoes that:  Do not have high heels.  Have rubber bottoms.  Are comfortable and fit you well.  Are closed at the toe. Do not wear sandals.  If you use a stepladder:  Make sure that it is fully opened. Do not climb a closed stepladder.  Make sure that both sides of the stepladder are locked into place.  Ask someone to hold it for you, if possible.  Clearly mark and make sure that you can see:  Any grab bars or handrails.  First and last steps.  Where the edge of each step is.  Use tools that help you move around (mobility aids) if they are needed. These include:  Canes.  Walkers.  Scooters.  Crutches.  Turn on the lights when you go into a dark area. Replace any  light bulbs as soon as they burn out.  Set up your furniture so you have a clear path. Avoid moving your furniture around.  If any of your floors are uneven, fix them.  If there are any pets around you, be aware of where they are.  Review your medicines with your doctor. Some medicines can make you feel dizzy. This can increase your chance of falling. Ask your doctor what other things that you can do to help prevent falls. This information is not intended to replace advice given to you by your health care provider. Make sure you discuss any questions you have with your health care provider. Document Released: 11/08/2008 Document Revised: 06/20/2015 Document Reviewed: 02/16/2014 Elsevier Interactive Patient Education  2017 [employer].

## 2020-07-12 ENCOUNTER — Inpatient Hospital Stay: Admission: RE | Admit: 2020-07-12 | Payer: Medicare HMO | Source: Ambulatory Visit

## 2020-07-15 DIAGNOSIS — H40053 Ocular hypertension, bilateral: Secondary | ICD-10-CM | POA: Diagnosis not present

## 2020-07-15 DIAGNOSIS — Z0100 Encounter for examination of eyes and vision without abnormal findings: Secondary | ICD-10-CM | POA: Diagnosis not present

## 2020-07-15 DIAGNOSIS — H40013 Open angle with borderline findings, low risk, bilateral: Secondary | ICD-10-CM | POA: Diagnosis not present

## 2020-07-23 ENCOUNTER — Other Ambulatory Visit: Payer: Self-pay

## 2020-07-23 NOTE — Progress Notes (Signed)
Chief Complaint  Patient presents with   Annual Exam     HPI: Alisha Brown 80 y.o. comes in today for Preventive Medicare exam/ She is doing okay but battling symptoms of neuropathy intermittent PVCs.  Feels that she has anxiety that causes chest pain and has been taking shots of brandy alcohol.    She has been using brandy cardiologist and to talk with PCP was not sure she should be doing that.  She was using low-dose intermittent alprazolam when she gets a panic attack type symptoms she does have some swallowing issues but no specific dysphagia and known to have a hiatal hernia. Neuropathy. : She states she has neuropathy that she thought was evaluated by the podiatrist when the Morton's neuroma was treated.  She states that she is having some burning pain that goes up her leg almost to her thigh worse at night no rash began taking some B vitamins recently which included B12 and B6 and thinks that some of her symptoms are getting better. Famotidine ocass but not the PPI  Had a bad episode of "white out" and drowsiness and was felt to be from the carvedilol so she stopped it.  Dr. Saunders Revel has left and she is back with Dr. Lovena Le.  At this point she is to follow-up in between her yearly visits if she is having increasing symptoms with PVCs. Anxiety.  Ask for low dose  alprazolam. Has been using brandy.  Health Maintenance  Topic Date Due   Zoster Vaccines- Shingrix (1 of 2) Never done   COVID-19 Vaccine (4 - Booster for Pfizer series) 05/06/2020   INFLUENZA VACCINE  08/26/2020   TETANUS/TDAP  09/24/2021   DEXA SCAN  Completed   Hepatitis C Screening  Completed   PNA vac Low Risk Adult  Completed   HPV VACCINES  Aged Out   Health Maintenance Review LIFESTYLE:  Exercise:   Tobacco/ETS: n Alcohol: shot  with cp. Sugar beverages: Sleep: not enough 5  Drug use: no HH:  2 1 dog    Hearing:  ok  Vision:  No limitations at present . Last eye check UTD Safety:  Has smoke detector  and wears seat belts.   No excess sun exposure. Sees dentist regularly. Advance directive :  Reviewed  Has one. Memory: Felt to be good  , no concern from her or her family. Better off of the carvedilol Depression: No anhedonia unusual crying or depressive symptoms Nutrition: Eats well balanced diet; adequate calcium and vitamin D. No swallowing chewing problems. Injury: no major injuries in the last six months. Other healthcare providers:  Reviewed today . Preventive parameters: up-to-date  Reviewed  ADLS:   There are no problems or need for assistance  feeding, obtaining food, dressing, toileting and bathing, managing money using phone. She is independent. Not driving because of the white outs.     ROS:  GEN/ HEENT: No fever, significant weight changes sweats headaches vision problems hearing changes, GI /GU: No adominal pain, vomiting, change in bowel habits. No blood in the stool. No significant GU symptoms. SKIN/HEME: ,no acute skin rashes suspicious lesions or bleeding. No lymphadenopathy, nodules, masses.  NEURO/ PSYCH:  No neurologic signs such as weakness numbness. No depression anxiety. IMM/ Allergy: No unusual infections.  Allergy .   REST of 12 system review negative except as per HPI   Past Medical History:  Diagnosis Date   Allergy    Anemia    Anxiety    Arrhythmia  heart   CHF (congestive heart failure) (HCC)    Diverticulosis of colon (without mention of hemorrhage)    GERD with stricture    Headache(784.0)    consult dr Tomma Rakers visual migraine?  agg by HRT?   Heart murmur    Hiatal hernia    Hyperlipidemia    Hypertension    Mitral valve disorder    PVC (premature ventricular contraction)    Seizures ([place])     Family History  Problem Relation Age of Onset   Diabetes Mother    Glaucoma Mother    Diabetes Son    Diabetes Sister        1/2 sister-same mom   Heart murmur Sister    Arrhythmia Sister    Breast cancer Maternal Grandmother    Hypertension  Brother    Heart murmur Brother    Arrhythmia Brother    Diabetes Brother    Colon cancer Neg Hx     Social History   Socioeconomic History   Marital status: Married    Spouse name: Not on file   Number of children: 1   Years of education: Not on file   Highest education level: Not on file  Occupational History   Occupation: retired  Tobacco Use   Smoking status: Never   Smokeless tobacco: Never  Vaping Use   Vaping Use: Never used  Substance and Sexual Activity   Alcohol use: No   Drug use: No   Sexual activity: Not Currently    Birth control/protection: None  Other Topics Concern   Not on file  Social History Narrative   HHof 2 dog    Just  Died   [name]    Retired and married   [name]      Dog with health issues  Cooks for him bladder stones   Social Determinants of Health   Financial Resource Strain: Low Risk    Difficulty of Paying Living Expenses: Not hard at all  Food Insecurity: No Food Insecurity   Worried About Charity fundraiser in the Last Year: Never true   Arboriculturist in the Last Year: Never true  Transportation Needs: No Transportation Needs   Lack of Transportation (Medical): No   Lack of Transportation (Non-Medical): No  Physical Activity: Inactive   Days of Exercise per Week: 0 days   Minutes of Exercise per Session: 0 min  Stress: No Stress Concern Present   Feeling of Stress : Not at all  Social Connections: Moderately Isolated   Frequency of Communication with Friends and Family: Three times a week   Frequency of Social Gatherings with Friends and Family: Three times a week   Attends Religious Services: Never   Active Member of Clubs or Organizations: No   Attends Archivist Meetings: Never   Marital Status: Married    Outpatient Encounter Medications as of 07/24/2020  Medication Sig   COVID-19 mRNA vaccine, Pfizer, 30 MCG/0.3ML injection INJECT AS DIRECTED   famotidine (PEPCID) 20 MG tablet Take 20 mg by mouth daily.    ketotifen (ZADITOR) 0.025 % ophthalmic solution Place 1-2 drops 2 (two) times daily as needed into both eyes (for itchy eye).   Magnesium 500 MG CAPS Take 500 mg by mouth daily.   Methylsulfonylmethane (MSM) 1000 MG CAPS Take 1,000 mg daily by mouth.   Ophthalmic Irrigation Solution (RA STERILE EYE [place] OP) Place 1 application daily into both eyes.   vitamin B-12 (CYANOCOBALAMIN) 1000 MCG tablet Take  1,000 mcg every other day by mouth.   VITAMIN D, CHOLECALCIFEROL, PO Take 10,000 Units by mouth daily.   ALPRAZolam (XANAX) 0.25 MG tablet Take 1 tablet (0.25 mg total) by mouth 3 (three) times daily as needed for anxiety.   No facility-administered encounter medications on file as of 07/24/2020.    EXAM:  BP 130/80 (BP Location: Left Arm, Patient Position: Sitting, Cuff Size: Normal)   Pulse 87   Temp 98.6 F (37 C) (Oral)   Ht 5\' 5"  (1.651 m)   Wt 149 lb 12.8 oz (67.9 kg)   SpO2 95%   BMI 24.93 kg/m   Body mass index is 24.93 kg/m.  Physical Exam: Vital signs reviewed FTD:DUKG is a well-developed well-nourished alert cooperative   who appears stated age in no acute distress.  HEENT: normocephalic atraumatic , Eyes: PERRL EOM's full, conjunctiva clear, Nares: paten,t no deformity discharge or tenderness., Ears: no deformity EAC's clear TMs with normal landmarks. Mouth:masked  NECK: supple without masses, thyromegaly or bruits. CHEST/PULM:  Clear to auscultation and percussion breath sounds equal no wheeze , rales or rhonchi. No chest wall deformities or tenderness. CV: PMI is nondisplaced, S1 S2 no gallops, murmurs, rubs.  Frequent irregular beats peripheral pulses are full without delay.No JVD .  ABDOMEN: Bowel sounds normal nontender  No guard or rebound, no hepato splenomegal no CVA tenderness.   Extremtities:  No clubbing cyanosis or edema, no acute joint swelling or redness no focal atrophy she will not let me touch her feet and take her socks off because of tenderness and  hypersensitivity NEURO:  Oriented x3, cranial nerves 3-12 appear to be intact, no obvious focal weakness,gait within normal limits no abnormal reflexes or asymmetrical SKIN: No acute rashes normal turgor, color, no bruising or petechiae. PSYCH: Oriented, good eye contact,  cognition and judgment appear normal. LN: no cervical axillary inguinal adenopathy No noted deficits in attention, and speech.   Lab Results  Component Value Date   WBC 5.6 09/21/2017   HGB 14.2 09/21/2017   HCT 42.8 09/21/2017   PLT 241.0 09/21/2017   GLUCOSE 98 09/21/2017   CHOL 270 (H) 09/21/2017   TRIG 81.0 09/21/2017   HDL 66.90 09/21/2017   LDLDIRECT 169.1 05/30/2012   LDLCALC 187 (H) 09/21/2017   ALT 18 09/21/2017   AST 16 09/21/2017   NA 139 09/21/2017   K 4.0 09/21/2017   CL 102 09/21/2017   CREATININE 0.97 09/21/2017   BUN 22 09/21/2017   CO2 30 09/21/2017   TSH 1.29 09/21/2017   INR 1.0 12/08/2016   HGBA1C 6.3 09/21/2017    ASSESSMENT AND PLAN:  Discussed the following assessment and plan:  Visit for preventive health examination  Medication management - Plan: Basic metabolic panel, CBC with Differential/Platelet, Hemoglobin A1c, Hepatic function panel, Lipid panel, TSH, T4, free, Methylmalonic acid, serum, Vitamin B12, VITAMIN D 25 Hydroxy (Vit-D Deficiency, Fractures), ANA, Electrophoresis, Protein and Immunofixation, CANCELED: Electrophoresis, Protein and Immunofixation  PVCs (premature ventricular contractions) - Plan: Basic metabolic panel, CBC with Differential/Platelet, Hemoglobin A1c, Hepatic function panel, Lipid panel, TSH, T4, free, Methylmalonic acid, serum, Vitamin B12, VITAMIN D 25 Hydroxy (Vit-D Deficiency, Fractures), ANA, Electrophoresis, Protein and Immunofixation, CANCELED: Electrophoresis, Protein and Immunofixation  NSVT (nonsustained ventricular tachycardia) (HCC) - Plan: Basic metabolic panel, CBC with Differential/Platelet, Hemoglobin A1c, Hepatic function panel, Lipid  panel, TSH, T4, free, Methylmalonic acid, serum, Vitamin B12, VITAMIN D 25 Hydroxy (Vit-D Deficiency, Fractures), ANA, Electrophoresis, Protein and Immunofixation, CANCELED: Electrophoresis, Protein and Immunofixation  Anxiety reaction - Plan: Basic metabolic panel, CBC with Differential/Platelet, Hemoglobin A1c, Hepatic function panel, Lipid panel, TSH, T4, free, Methylmalonic acid, serum, Vitamin B12, VITAMIN D 25 Hydroxy (Vit-D Deficiency, Fractures), ANA, Electrophoresis, Protein and Immunofixation, CANCELED: Electrophoresis, Protein and Immunofixation  Neuropathy - Plan: Basic metabolic panel, CBC with Differential/Platelet, Hemoglobin A1c, Hepatic function panel, Lipid panel, TSH, T4, free, Methylmalonic acid, serum, Vitamin B12, VITAMIN D 25 Hydroxy (Vit-D Deficiency, Fractures), ANA, Electrophoresis, Protein and Immunofixation, CANCELED: Electrophoresis, Protein and Immunofixation  Elevated lipids  History of esophageal stricture A number of complaints and concerns may have had some misdirected view of neuropathy.  Was not followed up fully ,pandemic pauses, explained today Agree with her continuing her B complex vitamins we will check vitamin levels today including an MMA. IFE SPEP and ANA in addition. Advise follow-up. ? If would be helped by gabapentin  type rx  It is unclear to me if she gets intermittent lightheadedness from her PVCs she can tell the difference between that and the medication side effect she states it was drowsiness was the difference. Told her my hesitancy to prescribe alprazolam but willing to give small amount that she can use as needed.  She prefers not being on any daily medicine. Uncertain if her chest pain is from anxiety plus a possible esophageal component.  We will follow Patient Care Team: Nephi Savage, Standley Brooking, MD as PCP - General End, Harrell Gave, MD as PCP - Cardiology (Cardiology) Evans Lance, MD (Cardiology) Jari Pigg, MD (Dermatology) DUNN  for eye  exams   Patient Instructions  Make appt for fasting lab tests.  Stay on the b complex vitamins for now . We can use alprazolam  cautiously intermittently for  anxiety chest attacks   Medication gives a risk  for fall  and dependent producing if taken regularly . If needed frequently we  would  readdress .   If ongoing  and progressive consider seeing neurology for   neuropathy .  Sometimes  gabapentin or lyrica type meds can  help with the pain .    Neuropathic Pain Neuropathic pain is pain caused by damage to the nerves that are responsible for certain sensations in your body (sensory nerves). The pain can be caused by: Damage to the sensory nerves that send signals to your spinal cord and brain (peripheral nervous system). Damage to the sensory nerves in your brain or spinal cord (central nervous system). Neuropathic pain can make you more sensitive to pain. Even a minor sensation can feel very painful. This is usually a long-term condition that can be difficult to treat. The type of pain differs from person to person. It may: Start suddenly (acute), or it may develop slowly and last for a long time (chronic). Come and go as damaged nerves heal, or it may stay at the same level for years. Cause emotional distress, loss of sleep, and a lower quality of life. What are the causes? The most common cause of this condition is diabetes. Many other diseases and conditions can also cause neuropathic pain. Causes of neuropathic pain can be classified as: Toxic. This is caused by medicines and chemicals. The most common cause of toxic neuropathic pain is damage from cancer treatments (chemotherapy). Metabolic. This can be caused by: Diabetes. This is the most common disease that damages the nerves. Lack of vitamin B from long-term alcohol abuse. Traumatic. Any injury that cuts, crushes, or stretches a nerve can cause damage and pain. A common example is feeling pain after  losing an arm or leg  (phantom limb pain). Compression-related. If a sensory nerve gets trapped or compressed for a long period of time, the blood supply to the nerve can be cut off. Vascular. Many blood vessel diseases can cause neuropathic pain by decreasing blood supply and oxygen to nerves. Autoimmune. This type of pain results from diseases in which the body's defense system (immune system) mistakenly attacks sensory nerves. Examples of autoimmune diseases that can cause neuropathic pain include lupus and multiple sclerosis. Infectious. Many types of viral infections can damage sensory nerves and cause pain. Shingles infection is a common cause of this type of pain. Inherited. Neuropathic pain can be a symptom of many diseases that are passed down through families (genetic). What increases the risk? You are more likely to develop this condition if: You have diabetes. You smoke. You drink too much alcohol. You are taking certain medicines, including medicines that kill cancer cells (chemotherapy) or that treat immune system disorders. What are the signs or symptoms? The main symptom is pain. Neuropathic pain is often described as: Burning. Shock-like. Stinging. Hot or cold. Itching. How is this diagnosed? No single test can diagnose neuropathic pain. It is diagnosed based on: Physical exam and your symptoms. Your health care provider will ask you about your pain. You may be asked to use a pain scale to describe how bad your pain is. Tests. These may be done to see if you have a high sensitivity to pain and to help find the cause and location of any sensory nerve damage. They include: Nerve conduction studies to test how well nerve signals travel through your sensory nerves (electrodiagnostic testing). Stimulating your sensory nerves through electrodes on your skin and measuring the response in your spinal cord and brain (somatosensory evoked potential). Imaging studies, such as: X-rays. CT scan. MRI. How  is this treated? Treatment for neuropathic pain may change over time. You may need to try different treatment options or a combination of treatments. Some options include: Treating the underlying cause of the neuropathy, such as diabetes, kidney disease, or vitamin deficiencies. Stopping medicines that can cause neuropathy, such as chemotherapy. Medicine to relieve pain. Medicines may include: Prescription or over-the-counter pain medicine. Anti-seizure medicine. Antidepressant medicines. Pain-relieving patches that are applied to painful areas of skin. A medicine to numb the area (local anesthetic), which can be injected as a nerve block. Transcutaneous nerve stimulation. This uses electrical currents to block painful nerve signals. The treatment is painless. Alternative treatments, such as: Acupuncture. Meditation. Massage. Physical therapy. Pain management programs. Counseling. Follow these instructions at home: Medicines  Take over-the-counter and prescription medicines only as told by your health care provider. Do not drive or use heavy machinery while taking prescription pain medicine. If you are taking prescription pain medicine, take actions to prevent or treat constipation. Your health care provider may recommend that you: Drink enough fluid to keep your urine pale yellow. Eat foods that are high in fiber, such as fresh fruits and vegetables, whole grains, and beans. Limit foods that are high in fat and processed sugars, such as fried or sweet foods. Take an over-the-counter or prescription medicine for constipation.  Lifestyle  Have a good support system at home. Consider joining a chronic pain support group. Do not use any products that contain nicotine or tobacco, such as cigarettes and e-cigarettes. If you need help quitting, ask your health care provider. Do not drink alcohol.  General instructions Learn as much as you can about your condition.  Work closely with  all your health care providers to find the treatment plan that works best for you. Ask your health care provider what activities are safe for you. Keep all follow-up visits as told by your health care provider. This is important. Contact a health care provider if: Your pain treatments are not working. You are having side effects from your medicines. You are struggling with tiredness (fatigue), mood changes, depression, or anxiety. Summary Neuropathic pain is pain caused by damage to the nerves that are responsible for certain sensations in your body (sensory nerves). Neuropathic pain may come and go as damaged nerves heal, or it may stay at the same level for years. Neuropathic pain is usually a long-term condition that can be difficult to treat. Consider joining a chronic pain support group. This information is not intended to replace advice given to you by your health care provider. Make sure you discuss any questions you have with your healthcare provider. Document Revised: 05/05/2018 Document Reviewed: 01/29/2017 Elsevier Patient Education  [place] Maintenance, Female Adopting a healthy lifestyle and getting preventive care are important in promoting health and wellness. Ask your health care provider about: The right schedule for you to have regular tests and exams. Things you can do on your own to prevent diseases and keep yourself healthy. What should I know about diet, weight, and exercise? Eat a healthy diet  Eat a diet that includes plenty of vegetables, fruits, low-fat dairy products, and lean protein. Do not eat a lot of foods that are high in solid fats, added sugars, or sodium.  Maintain a healthy weight Body mass index (BMI) is used to identify weight problems. It estimates body fat based on height and weight. Your health care provider can help determineyour BMI and help you achieve or maintain a healthy weight. Get regular exercise Get regular  exercise. This is one of the most important things you can do for your health. Most adults should: Exercise for at least 150 minutes each week. The exercise should increase your heart rate and make you sweat (moderate-intensity exercise). Do strengthening exercises at least twice a week. This is in addition to the moderate-intensity exercise. Spend less time sitting. Even light physical activity can be beneficial. Watch cholesterol and blood lipids Have your blood tested for lipids and cholesterol at 78 years of age, then havethis test every 5 years. Have your cholesterol levels checked more often if: Your lipid or cholesterol levels are high. You are older than 78 years of age. You are at high risk for heart disease. What should I know about cancer screening? Depending on your health history and family history, you may need to have cancer screening at various ages. This may include screening for: Breast cancer. Cervical cancer. Colorectal cancer. Skin cancer. Lung cancer. What should I know about heart disease, diabetes, and high blood pressure? Blood pressure and heart disease High blood pressure causes heart disease and increases the risk of stroke. This is more likely to develop in people who have high blood pressure readings, are of African descent, or are overweight. Have your blood pressure checked: Every 3-5 years if you are 62-65 years of age. Every year if you are 18 years old or older. Diabetes Have regular diabetes screenings. This checks your fasting blood sugar level. Have the screening done: Once every three years after age 25 if you are at a normal weight and have a low risk for diabetes. More often and at  a younger age if you are overweight or have a high risk for diabetes. What should I know about preventing infection? Hepatitis B If you have a higher risk for hepatitis B, you should be screened for this virus. Talk with your health care provider to find out if you are  at risk forhepatitis B infection. Hepatitis C Testing is recommended for: Everyone born from [birth year] through 1963. Anyone with known risk factors for hepatitis C. Sexually transmitted infections (STIs) Get screened for STIs, including gonorrhea and chlamydia, if: You are sexually active and are younger than 78 years of age. You are older than 78 years of age and your health care provider tells you that you are at risk for this type of infection. Your sexual activity has changed since you were last screened, and you are at increased risk for chlamydia or gonorrhea. Ask your health care provider if you are at risk. Ask your health care provider about whether you are at high risk for HIV. Your health care provider may recommend a prescription medicine to help prevent HIV infection. If you choose to take medicine to prevent HIV, you should first get tested for HIV. You should then be tested every 3 months for as long as you are taking the medicine. Pregnancy If you are about to stop having your period (premenopausal) and you may become pregnant, seek counseling before you get pregnant. Take 400 to 800 micrograms (mcg) of folic acid every day if you become pregnant. Ask for birth control (contraception) if you want to prevent pregnancy. Osteoporosis and menopause Osteoporosis is a disease in which the bones lose minerals and strength with aging. This can result in bone fractures. If you are 49 years old or older, or if you are at risk for osteoporosis and fractures, ask your health care provider if you should: Be screened for bone loss. Take a calcium or vitamin D supplement to lower your risk of fractures. Be given hormone replacement therapy (HRT) to treat symptoms of menopause. Follow these instructions at home: Lifestyle Do not use any products that contain nicotine or tobacco, such as cigarettes, e-cigarettes, and chewing tobacco. If you need help quitting, ask your health care provider. Do not  use street drugs. Do not share needles. Ask your health care provider for help if you need support or information about quitting drugs. Alcohol use Do not drink alcohol if: Your health care provider tells you not to drink. You are pregnant, may be pregnant, or are planning to become pregnant. If you drink alcohol: Limit how much you use to 0-1 drink a day. Limit intake if you are breastfeeding. Be aware of how much alcohol is in your drink. In the [place] one drink equals one 12 oz bottle of beer (355 mL), one 5 oz glass of wine (148 mL), or one 1 oz glass of hard liquor (44 mL). General instructions Schedule regular health, dental, and eye exams. Stay current with your vaccines. Tell your health care provider if: You often feel depressed. You have ever been abused or do not feel safe at home. Summary Adopting a healthy lifestyle and getting preventive care are important in promoting health and wellness. Follow your health care provider's instructions about healthy diet, exercising, and getting tested or screened for diseases. Follow your health care provider's instructions on monitoring your cholesterol and blood pressure. This information is not intended to replace advice given to you by your health care provider. Make sure you discuss any questions you have with  your healthcare provider. Document Revised: 01/05/2018 Document Reviewed: 01/05/2018 Elsevier Patient Education  2022 [employer]. Denetra Formoso M.D.

## 2020-07-24 ENCOUNTER — Encounter: Payer: Self-pay | Admitting: Internal Medicine

## 2020-07-24 ENCOUNTER — Ambulatory Visit (INDEPENDENT_AMBULATORY_CARE_PROVIDER_SITE_OTHER): Payer: Medicare HMO | Admitting: Internal Medicine

## 2020-07-24 VITALS — BP 130/80 | HR 87 | Temp 98.6°F | Ht 65.0 in | Wt 149.8 lb

## 2020-07-24 DIAGNOSIS — Z8719 Personal history of other diseases of the digestive system: Secondary | ICD-10-CM | POA: Diagnosis not present

## 2020-07-24 DIAGNOSIS — Z79899 Other long term (current) drug therapy: Secondary | ICD-10-CM

## 2020-07-24 DIAGNOSIS — I493 Ventricular premature depolarization: Secondary | ICD-10-CM | POA: Diagnosis not present

## 2020-07-24 DIAGNOSIS — Z0000 Encounter for general adult medical examination without abnormal findings: Secondary | ICD-10-CM

## 2020-07-24 DIAGNOSIS — F411 Generalized anxiety disorder: Secondary | ICD-10-CM

## 2020-07-24 DIAGNOSIS — I472 Ventricular tachycardia: Secondary | ICD-10-CM

## 2020-07-24 DIAGNOSIS — E785 Hyperlipidemia, unspecified: Secondary | ICD-10-CM | POA: Diagnosis not present

## 2020-07-24 DIAGNOSIS — I4729 Other ventricular tachycardia: Secondary | ICD-10-CM

## 2020-07-24 DIAGNOSIS — G629 Polyneuropathy, unspecified: Secondary | ICD-10-CM | POA: Diagnosis not present

## 2020-07-24 MED ORDER — ALPRAZOLAM 0.25 MG PO TABS: 0.2500 mg | ORAL_TABLET | Freq: Three times a day (TID) | ORAL | 0 refills | Status: DC | PRN

## 2020-07-24 NOTE — Patient Instructions (Addendum)
Make appt for fasting lab tests.  Stay on the b complex vitamins for now . We can use alprazolam  cautiously intermittently for  anxiety chest attacks   Medication gives a risk  for fall  and dependent producing if taken regularly . If needed frequently we  would  readdress .   If ongoing  and progressive consider seeing neurology for   neuropathy .  Sometimes  gabapentin or lyrica type meds can  help with the pain .    Neuropathic Pain Neuropathic pain is pain caused by damage to the nerves that are responsible for certain sensations in your body (sensory nerves). The pain can be caused by: Damage to the sensory nerves that send signals to your spinal cord and brain (peripheral nervous system). Damage to the sensory nerves in your brain or spinal cord (central nervous system). Neuropathic pain can make you more sensitive to pain. Even a minor sensation can feel very painful. This is usually a long-term condition that can be difficult to treat. The type of pain differs from person to person. It may: Start suddenly (acute), or it may develop slowly and last for a long time (chronic). Come and go as damaged nerves heal, or it may stay at the same level for years. Cause emotional distress, loss of sleep, and a lower quality of life. What are the causes? The most common cause of this condition is diabetes. Many other diseases and conditions can also cause neuropathic pain. Causes of neuropathic pain can be classified as: Toxic. This is caused by medicines and chemicals. The most common cause of toxic neuropathic pain is damage from cancer treatments (chemotherapy). Metabolic. This can be caused by: Diabetes. This is the most common disease that damages the nerves. Lack of vitamin B from long-term alcohol abuse. Traumatic. Any injury that cuts, crushes, or stretches a nerve can cause damage and pain. A common example is feeling pain after losing an arm or leg (phantom limb  pain). Compression-related. If a sensory nerve gets trapped or compressed for a long period of time, the blood supply to the nerve can be cut off. Vascular. Many blood vessel diseases can cause neuropathic pain by decreasing blood supply and oxygen to nerves. Autoimmune. This type of pain results from diseases in which the body's defense system (immune system) mistakenly attacks sensory nerves. Examples of autoimmune diseases that can cause neuropathic pain include lupus and multiple sclerosis. Infectious. Many types of viral infections can damage sensory nerves and cause pain. Shingles infection is a common cause of this type of pain. Inherited. Neuropathic pain can be a symptom of many diseases that are passed down through families (genetic). What increases the risk? You are more likely to develop this condition if: You have diabetes. You smoke. You drink too much alcohol. You are taking certain medicines, including medicines that kill cancer cells (chemotherapy) or that treat immune system disorders. What are the signs or symptoms? The main symptom is pain. Neuropathic pain is often described as: Burning. Shock-like. Stinging. Hot or cold. Itching. How is this diagnosed? No single test can diagnose neuropathic pain. It is diagnosed based on: Physical exam and your symptoms. Your health care provider will ask you about your pain. You may be asked to use a pain scale to describe how bad your pain is. Tests. These may be done to see if you have a high sensitivity to pain and to help find the cause and location of any sensory nerve damage. They include: Nerve  conduction studies to test how well nerve signals travel through your sensory nerves (electrodiagnostic testing). Stimulating your sensory nerves through electrodes on your skin and measuring the response in your spinal cord and brain (somatosensory evoked potential). Imaging studies, such as: X-rays. CT scan. MRI. How is this  treated? Treatment for neuropathic pain may change over time. You may need to try different treatment options or a combination of treatments. Some options include: Treating the underlying cause of the neuropathy, such as diabetes, kidney disease, or vitamin deficiencies. Stopping medicines that can cause neuropathy, such as chemotherapy. Medicine to relieve pain. Medicines may include: Prescription or over-the-counter pain medicine. Anti-seizure medicine. Antidepressant medicines. Pain-relieving patches that are applied to painful areas of skin. A medicine to numb the area (local anesthetic), which can be injected as a nerve block. Transcutaneous nerve stimulation. This uses electrical currents to block painful nerve signals. The treatment is painless. Alternative treatments, such as: Acupuncture. Meditation. Massage. Physical therapy. Pain management programs. Counseling. Follow these instructions at home: Medicines  Take over-the-counter and prescription medicines only as told by your health care provider. Do not drive or use heavy machinery while taking prescription pain medicine. If you are taking prescription pain medicine, take actions to prevent or treat constipation. Your health care provider may recommend that you: Drink enough fluid to keep your urine pale yellow. Eat foods that are high in fiber, such as fresh fruits and vegetables, whole grains, and beans. Limit foods that are high in fat and processed sugars, such as fried or sweet foods. Take an over-the-counter or prescription medicine for constipation.  Lifestyle  Have a good support system at home. Consider joining a chronic pain support group. Do not use any products that contain nicotine or tobacco, such as cigarettes and e-cigarettes. If you need help quitting, ask your health care provider. Do not drink alcohol.  General instructions Learn as much as you can about your condition. Work closely with all your  health care providers to find the treatment plan that works best for you. Ask your health care provider what activities are safe for you. Keep all follow-up visits as told by your health care provider. This is important. Contact a health care provider if: Your pain treatments are not working. You are having side effects from your medicines. You are struggling with tiredness (fatigue), mood changes, depression, or anxiety. Summary Neuropathic pain is pain caused by damage to the nerves that are responsible for certain sensations in your body (sensory nerves). Neuropathic pain may come and go as damaged nerves heal, or it may stay at the same level for years. Neuropathic pain is usually a long-term condition that can be difficult to treat. Consider joining a chronic pain support group. This information is not intended to replace advice given to you by your health care provider. Make sure you discuss any questions you have with your healthcare provider. Document Revised: 05/05/2018 Document Reviewed: 01/29/2017 Elsevier Patient Education  [place] Maintenance, Female Adopting a healthy lifestyle and getting preventive care are important in promoting health and wellness. Ask your health care provider about: The right schedule for you to have regular tests and exams. Things you can do on your own to prevent diseases and keep yourself healthy. What should I know about diet, weight, and exercise? Eat a healthy diet  Eat a diet that includes plenty of vegetables, fruits, low-fat dairy products, and lean protein. Do not eat a lot of foods that are high  in solid fats, added sugars, or sodium.  Maintain a healthy weight Body mass index (BMI) is used to identify weight problems. It estimates body fat based on height and weight. Your health care provider can help determineyour BMI and help you achieve or maintain a healthy weight. Get regular exercise Get regular exercise. This  is one of the most important things you can do for your health. Most adults should: Exercise for at least 150 minutes each week. The exercise should increase your heart rate and make you sweat (moderate-intensity exercise). Do strengthening exercises at least twice a week. This is in addition to the moderate-intensity exercise. Spend less time sitting. Even light physical activity can be beneficial. Watch cholesterol and blood lipids Have your blood tested for lipids and cholesterol at 78 years of age, then havethis test every 5 years. Have your cholesterol levels checked more often if: Your lipid or cholesterol levels are high. You are older than 78 years of age. You are at high risk for heart disease. What should I know about cancer screening? Depending on your health history and family history, you may need to have cancer screening at various ages. This may include screening for: Breast cancer. Cervical cancer. Colorectal cancer. Skin cancer. Lung cancer. What should I know about heart disease, diabetes, and high blood pressure? Blood pressure and heart disease High blood pressure causes heart disease and increases the risk of stroke. This is more likely to develop in people who have high blood pressure readings, are of African descent, or are overweight. Have your blood pressure checked: Every 3-5 years if you are 51-39 years of age. Every year if you are 94 years old or older. Diabetes Have regular diabetes screenings. This checks your fasting blood sugar level. Have the screening done: Once every three years after age 13 if you are at a normal weight and have a low risk for diabetes. More often and at a younger age if you are overweight or have a high risk for diabetes. What should I know about preventing infection? Hepatitis B If you have a higher risk for hepatitis B, you should be screened for this virus. Talk with your health care provider to find out if you are at risk  forhepatitis B infection. Hepatitis C Testing is recommended for: Everyone born from [birth year] through 1963. Anyone with known risk factors for hepatitis C. Sexually transmitted infections (STIs) Get screened for STIs, including gonorrhea and chlamydia, if: You are sexually active and are younger than 78 years of age. You are older than 78 years of age and your health care provider tells you that you are at risk for this type of infection. Your sexual activity has changed since you were last screened, and you are at increased risk for chlamydia or gonorrhea. Ask your health care provider if you are at risk. Ask your health care provider about whether you are at high risk for HIV. Your health care provider may recommend a prescription medicine to help prevent HIV infection. If you choose to take medicine to prevent HIV, you should first get tested for HIV. You should then be tested every 3 months for as long as you are taking the medicine. Pregnancy If you are about to stop having your period (premenopausal) and you may become pregnant, seek counseling before you get pregnant. Take 400 to 800 micrograms (mcg) of folic acid every day if you become pregnant. Ask for birth control (contraception) if you want to prevent pregnancy. Osteoporosis and menopause  Osteoporosis is a disease in which the bones lose minerals and strength with aging. This can result in bone fractures. If you are 57 years old or older, or if you are at risk for osteoporosis and fractures, ask your health care provider if you should: Be screened for bone loss. Take a calcium or vitamin D supplement to lower your risk of fractures. Be given hormone replacement therapy (HRT) to treat symptoms of menopause. Follow these instructions at home: Lifestyle Do not use any products that contain nicotine or tobacco, such as cigarettes, e-cigarettes, and chewing tobacco. If you need help quitting, ask your health care provider. Do not use  street drugs. Do not share needles. Ask your health care provider for help if you need support or information about quitting drugs. Alcohol use Do not drink alcohol if: Your health care provider tells you not to drink. You are pregnant, may be pregnant, or are planning to become pregnant. If you drink alcohol: Limit how much you use to 0-1 drink a day. Limit intake if you are breastfeeding. Be aware of how much alcohol is in your drink. In the [place] one drink equals one 12 oz bottle of beer (355 mL), one 5 oz glass of wine (148 mL), or one 1 oz glass of hard liquor (44 mL). General instructions Schedule regular health, dental, and eye exams. Stay current with your vaccines. Tell your health care provider if: You often feel depressed. You have ever been abused or do not feel safe at home. Summary Adopting a healthy lifestyle and getting preventive care are important in promoting health and wellness. Follow your health care provider's instructions about healthy diet, exercising, and getting tested or screened for diseases. Follow your health care provider's instructions on monitoring your cholesterol and blood pressure. This information is not intended to replace advice given to you by your health care provider. Make sure you discuss any questions you have with your healthcare provider. Document Revised: 01/05/2018 Document Reviewed: 01/05/2018 Elsevier Patient Education  2022 [employer].

## 2020-07-26 ENCOUNTER — Other Ambulatory Visit: Payer: Self-pay

## 2020-07-26 ENCOUNTER — Other Ambulatory Visit (INDEPENDENT_AMBULATORY_CARE_PROVIDER_SITE_OTHER): Payer: Medicare HMO

## 2020-07-26 DIAGNOSIS — Z79899 Other long term (current) drug therapy: Secondary | ICD-10-CM | POA: Diagnosis not present

## 2020-07-26 DIAGNOSIS — H40013 Open angle with borderline findings, low risk, bilateral: Secondary | ICD-10-CM | POA: Diagnosis not present

## 2020-07-26 DIAGNOSIS — G629 Polyneuropathy, unspecified: Secondary | ICD-10-CM

## 2020-07-26 DIAGNOSIS — I4729 Other ventricular tachycardia: Secondary | ICD-10-CM

## 2020-07-26 DIAGNOSIS — I493 Ventricular premature depolarization: Secondary | ICD-10-CM | POA: Diagnosis not present

## 2020-07-26 DIAGNOSIS — F411 Generalized anxiety disorder: Secondary | ICD-10-CM | POA: Diagnosis not present

## 2020-07-26 DIAGNOSIS — I472 Ventricular tachycardia: Secondary | ICD-10-CM | POA: Diagnosis not present

## 2020-07-26 LAB — CBC WITH DIFFERENTIAL/PLATELET
Basophils Absolute: 0.1 10*3/uL (ref 0.0–0.1)
Basophils Relative: 1.2 % (ref 0.0–3.0)
Eosinophils Absolute: 0.1 10*3/uL (ref 0.0–0.7)
Eosinophils Relative: 2.2 % (ref 0.0–5.0)
HCT: 42 % (ref 36.0–46.0)
Hemoglobin: 14.1 g/dL (ref 12.0–15.0)
Lymphocytes Relative: 38 % (ref 12.0–46.0)
Lymphs Abs: 1.9 10*3/uL (ref 0.7–4.0)
MCHC: 33.5 g/dL (ref 30.0–36.0)
MCV: 88.2 fl (ref 78.0–100.0)
Monocytes Absolute: 0.4 10*3/uL (ref 0.1–1.0)
Monocytes Relative: 7.8 % (ref 3.0–12.0)
Neutro Abs: 2.5 10*3/uL (ref 1.4–7.7)
Neutrophils Relative %: 50.8 % (ref 43.0–77.0)
Platelets: 227 10*3/uL (ref 150.0–400.0)
RBC: 4.76 Mil/uL (ref 3.87–5.11)
RDW: 14.5 % (ref 11.5–15.5)
WBC: 4.9 10*3/uL (ref 4.0–10.5)

## 2020-07-26 LAB — LIPID PANEL
Cholesterol: 259 mg/dL — ABNORMAL HIGH (ref 0–200)
HDL: 68.9 mg/dL (ref >39.00)
LDL Cholesterol: 175 mg/dL — ABNORMAL HIGH (ref 0–99)
NonHDL: 190.05
Total CHOL/HDL Ratio: 4
Triglycerides: 77 mg/dL (ref 0.0–149.0)
VLDL: 15.4 mg/dL (ref 0.0–40.0)

## 2020-07-26 LAB — BASIC METABOLIC PANEL
BUN: 17 mg/dL (ref 6–23)
CO2: 28 mEq/L (ref 19–32)
Calcium: 9.5 mg/dL (ref 8.4–10.5)
Chloride: 105 mEq/L (ref 96–112)
Creatinine, Ser: 0.87 mg/dL (ref 0.40–1.20)
GFR: 64.12 mL/min (ref >60.00)
Glucose, Bld: 98 mg/dL (ref 70–99)
Potassium: 4 mEq/L (ref 3.5–5.1)
Sodium: 140 mEq/L (ref 135–145)

## 2020-07-26 LAB — TSH: TSH: 1.25 u[IU]/mL (ref 0.35–5.50)

## 2020-07-26 LAB — COMPREHENSIVE METABOLIC PANEL
Albumin: 4.1 (ref 3.5–5.0)
Globulin: 2.4

## 2020-07-26 LAB — HEMOGLOBIN A1C: Hgb A1c MFr Bld: 6.2 % (ref 4.6–6.5)

## 2020-07-26 LAB — HEPATIC FUNCTION PANEL
ALT: 18 U/L (ref 0–35)
AST: 21 U/L (ref 0–37)
Albumin: 4.5 g/dL (ref 3.5–5.2)
Alkaline Phosphatase: 63 U/L (ref 39–117)
Bilirubin, Direct: 0.1 mg/dL (ref 0.0–0.3)
Total Bilirubin: 0.7 mg/dL (ref 0.2–1.2)
Total Protein: 6.8 g/dL (ref 6.0–8.3)

## 2020-07-26 LAB — VITAMIN D 25 HYDROXY (VIT D DEFICIENCY, FRACTURES): VITD: 98.75 ng/mL (ref 30.00–100.00)

## 2020-07-26 LAB — VITAMIN B12: Vitamin B-12: 612 pg/mL (ref 211–911)

## 2020-07-26 LAB — T4, FREE: Free T4: 0.97 ng/dL (ref 0.60–1.60)

## 2020-07-26 NOTE — Addendum Note (Signed)
Addended by: Amanda Cockayne on: 07/26/2020 03:06 PM   Modules accepted: Orders

## 2020-07-31 LAB — MULTIPLE MYELOMA PANEL, SERUM
Albumin SerPl Elph-Mcnc: 4.1 g/dL (ref 2.9–4.4)
Albumin/Glob SerPl: 1.8 — ABNORMAL HIGH (ref 0.7–1.7)
Alpha 1: 0.2 g/dL (ref 0.0–0.4)
Alpha2 Glob SerPl Elph-Mcnc: 0.7 g/dL (ref 0.4–1.0)
B-Globulin SerPl Elph-Mcnc: 0.9 g/dL (ref 0.7–1.3)
Gamma Glob SerPl Elph-Mcnc: 0.7 g/dL (ref 0.4–1.8)
Globulin, Total: 2.4 g/dL (ref 2.2–3.9)
IgA/Immunoglobulin A, Serum: 77 mg/dL (ref 64–422)
IgG (Immunoglobin G), Serum: 719 mg/dL (ref 586–1602)
IgM (Immunoglobulin M), Srm: 49 mg/dL (ref 26–217)
Total Protein: 6.5 g/dL (ref 6.0–8.5)

## 2020-08-01 LAB — ANA: Anti Nuclear Antibody (ANA): NEGATIVE

## 2020-08-01 LAB — METHYLMALONIC ACID, SERUM: Methylmalonic Acid, Quant: 105 nmol/L (ref 87–318)

## 2020-08-02 NOTE — Progress Notes (Signed)
Results are normal  except  cholesterol is still up . Stay on b vitamins  levels are good right now .    We can have you see neurology about the neuropathy pain if not continuing to get better  . Can make a fu  appt if  neuropathy pains not controlled .

## 2020-08-07 ENCOUNTER — Encounter: Payer: Self-pay | Admitting: Internal Medicine

## 2020-11-18 ENCOUNTER — Ambulatory Visit (INDEPENDENT_AMBULATORY_CARE_PROVIDER_SITE_OTHER): Payer: Medicare HMO | Admitting: Family Medicine

## 2020-11-18 ENCOUNTER — Other Ambulatory Visit: Payer: Self-pay

## 2020-11-18 VITALS — BP 140/70 | HR 40 | Temp 97.8°F | Wt 145.4 lb

## 2020-11-18 DIAGNOSIS — R350 Frequency of micturition: Secondary | ICD-10-CM | POA: Diagnosis not present

## 2020-11-18 LAB — POCT URINALYSIS DIPSTICK
Bilirubin, UA: NEGATIVE
Blood, UA: NEGATIVE
Glucose, UA: NEGATIVE
Ketones, UA: NEGATIVE
Leukocytes, UA: NEGATIVE
Nitrite, UA: NEGATIVE
Protein, UA: NEGATIVE
Spec Grav, UA: 1.015 (ref 1.010–1.025)
Urobilinogen, UA: 0.2 E.U./dL
pH, UA: 6 (ref 5.0–8.0)

## 2020-11-18 NOTE — Patient Instructions (Signed)
No evidence for UTI on urine dipstick today  Could consider trial of OTC Pyridium.

## 2020-11-18 NOTE — Progress Notes (Signed)
Established Patient Office Visit  Subjective:  Patient ID: Alisha Brown, female    DOB: Oct 11, 1940  Age: 80 y.o. MRN: [id number]  CC:  Chief Complaint  Patient presents with   Urinary Tract Infection    Abdominal discomfort, cramping after urinating, urinary pressure.    HPI LASAUNDRA RICHE presents for concern for possible UTI.  She relates for several days some mild suprapubic discomfort.  She had cramping-like discomfort after urinating.  No actual burning with urination.  No hematuria.  No fever or chills.  No flank pain. She states she was treated for UTI at urgent care last January but none since then.  No history of interstitial cystitis.  She does have history of atrophic vaginitis.  She uses coconut oil topically which has helped.  No vaginal discharge.  Past Medical History:  Diagnosis Date   Allergy    Anemia    Anxiety    Arrhythmia    heart   CHF (congestive heart failure) (HCC)    Diverticulosis of colon (without mention of hemorrhage)    GERD with stricture    Headache(784.0)    consult dr Tomma Rakers visual migraine?  agg by HRT?   Heart murmur    Hiatal hernia    Hyperlipidemia    Hypertension    Mitral valve disorder    PVC (premature ventricular contraction)    Seizures ([place])     Past Surgical History:  Procedure Laterality Date   ABDOMINAL HYSTERECTOMY  1987   ADENOIDECTOMY     APPENDECTOMY  1987   BREAST EXCISIONAL BIOPSY Right 1993   BREAST SURGERY Right 1990   biopsy,benign   CARDIAC CATHETERIZATION  2009   No CAD. LVEF 50%.   COLONOSCOPY     ESOPHAGOGASTRODUODENOSCOPY     FOOT SURGERY Right 2011   RIGHT/LEFT HEART CATH AND CORONARY ANGIOGRAPHY N/A 12/10/2016   Procedure: RIGHT/LEFT HEART CATH AND CORONARY ANGIOGRAPHY;  Surgeon: Nelva Bush, MD;  Location: [facility];  Service: Cardiovascular;  Laterality: N/A;   TONSILLECTOMY  1953   TUBAL LIGATION      Family History  Problem Relation Age of Onset   Diabetes Mother     Glaucoma Mother    Diabetes Son    Diabetes Sister        1/2 sister-same mom   Heart murmur Sister    Arrhythmia Sister    Breast cancer Maternal Grandmother    Hypertension Brother    Heart murmur Brother    Arrhythmia Brother    Diabetes Brother    Colon cancer Neg Hx     Social History   Socioeconomic History   Marital status: Married    Spouse name: Not on file   Number of children: 1   Years of education: Not on file   Highest education level: Not on file  Occupational History   Occupation: retired  Tobacco Use   Smoking status: Never   Smokeless tobacco: Never  Vaping Use   Vaping Use: Never used  Substance and Sexual Activity   Alcohol use: No   Drug use: No   Sexual activity: Not Currently    Birth control/protection: None  Other Topics Concern   Not on file  Social History Narrative   HHof 2 dog    Just  Died   [name]    Retired and married   [name]      Dog with health issues  Cooks for him bladder stones   Social Determinants  of Health   Financial Resource Strain: Low Risk    Difficulty of Paying Living Expenses: Not hard at all  Food Insecurity: No Food Insecurity   Worried About [place] in the Last Year: Never true   [employer] in the Last Year: Never true  Transportation Needs: No Transportation Needs   Lack of Transportation (Medical): No   Lack of Transportation (Non-Medical): No  Physical Activity: Inactive   Days of Exercise per Week: 0 days   Minutes of Exercise per Session: 0 min  Stress: No Stress Concern Present   Feeling of Stress : Not at all  Social Connections: Moderately Isolated   Frequency of Communication with Friends and Family: Three times a week   Frequency of Social Gatherings with Friends and Family: Three times a week   Attends Religious Services: Never   Active Member of Clubs or Organizations: No   Attends Archivist Meetings: Never   Marital Status: Married  Human resources officer Violence: Not  At Risk   Fear of Current or Ex-Partner: No   Emotionally Abused: No   Physically Abused: No   Sexually Abused: No    Outpatient Medications Prior to Visit  Medication Sig Dispense Refill   ALPRAZolam (XANAX) 0.25 MG tablet Take 1 tablet (0.25 mg total) by mouth 3 (three) times daily as needed for anxiety. 24 tablet 0   COVID-19 mRNA vaccine, Pfizer, 30 MCG/0.3ML injection INJECT AS DIRECTED .3 mL 0   famotidine (PEPCID) 20 MG tablet Take 20 mg by mouth daily.     ketotifen (ZADITOR) 0.025 % ophthalmic solution Place 1-2 drops 2 (two) times daily as needed into both eyes (for itchy eye).     Magnesium 500 MG CAPS Take 500 mg by mouth daily.     Methylsulfonylmethane (MSM) 1000 MG CAPS Take 1,000 mg daily by mouth.     Ophthalmic Irrigation Solution (RA STERILE EYE [place] OP) Place 1 application daily into both eyes.     vitamin B-12 (CYANOCOBALAMIN) 1000 MCG tablet Take 1,000 mcg every other day by mouth.     VITAMIN D, CHOLECALCIFEROL, PO Take 10,000 Units by mouth daily.     No facility-administered medications prior to visit.    Allergies  Allergen Reactions   Contrast Media [Iodinated Diagnostic Agents] Hives    Hives/nausea   Aleve [Naproxen Sodium] Hives   Augmentin [Amoxicillin-Pot Clavulanate] Diarrhea and Nausea And Vomiting    Has patient had a PCN reaction causing immediate rash, facial/tongue/throat swelling, SOB or lightheadedness with hypotension: No Has patient had a PCN reaction causing severe rash involving mucus membranes or skin necrosis: No Has patient had a PCN reaction that required hospitalization: No Has patient had a PCN reaction occurring within the last 10 years: No--nausea/vomitng ONLY (pt believes dosage was too high) If all of the above answers are "NO", then may proceed with Cephalosporin use.    Codeine Nausea And Vomiting and Other (See Comments)    Dizziness.   Gabapentin Other (See Comments)    pseudo seizures.   Krill Oil Hives   Propoxyphene  N-Acetaminophen     REACTION: hives,dizzy,gi upset    ROS Review of Systems  Constitutional:  Negative for chills and fever.  Gastrointestinal:  Negative for nausea and vomiting.  Genitourinary:  Negative for flank pain and hematuria.     Objective:    Physical Exam Vitals reviewed.  Constitutional:      Appearance: Normal appearance.  Cardiovascular:  Rate and Rhythm: Normal rate and regular rhythm.  Pulmonary:     Effort: Pulmonary effort is normal.     Breath sounds: Normal breath sounds.  Neurological:     Mental Status: She is alert.    BP 140/70 (BP Location: Left Arm, Patient Position: Sitting, Cuff Size: Normal)   Pulse (!) 40   Temp 97.8 F (36.6 C) (Oral)   Wt 145 lb 6.4 oz (66 kg)   SpO2 98%   BMI 24.20 kg/m  Wt Readings from Last 3 Encounters:  11/18/20 145 lb 6.4 oz (66 kg)  07/24/20 149 lb 12.8 oz (67.9 kg)  02/29/20 152 lb 9.6 oz (69.2 kg)     Health Maintenance Due  Topic Date Due   Zoster Vaccines- Shingrix (1 of 2) Never done   COVID-19 Vaccine (4 - Booster for Pfizer series) 04/02/2020   INFLUENZA VACCINE  08/26/2020    There are no preventive care reminders to display for this patient.  Lab Results  Component Value Date   TSH 1.25 07/26/2020   Lab Results  Component Value Date   WBC 4.9 07/26/2020   HGB 14.1 07/26/2020   HCT 42.0 07/26/2020   MCV 88.2 07/26/2020   PLT 227.0 07/26/2020   Lab Results  Component Value Date   NA 140 07/26/2020   K 4.0 07/26/2020   CO2 28 07/26/2020   GLUCOSE 98 07/26/2020   BUN 17 07/26/2020   CREATININE 0.87 07/26/2020   BILITOT 0.7 07/26/2020   ALKPHOS 63 07/26/2020   AST 21 07/26/2020   ALT 18 07/26/2020   PROT 6.5 07/26/2020   ALBUMIN 4.5 07/26/2020   CALCIUM 9.5 07/26/2020   GFR 64.12 07/26/2020   Lab Results  Component Value Date   CHOL 259 (H) 07/26/2020   Lab Results  Component Value Date   HDL 68.90 07/26/2020   Lab Results  Component Value Date   LDLCALC 175 (H)  07/26/2020   Lab Results  Component Value Date   TRIG 77.0 07/26/2020   Lab Results  Component Value Date   CHOLHDL 4 07/26/2020   Lab Results  Component Value Date   HGBA1C 6.2 07/26/2020      Assessment & Plan:   Problem List Items Addressed This Visit   None Visit Diagnoses     Urinary frequency    -  Primary   Relevant Orders   POCT urinalysis dipstick (Completed)     Urine dipstick is completely normal.  No evidence for UTI on dipstick. Focus on good hydration.  Patient will consider trial over-the-counter Pyridium.  Follow-up for any persistent or worsening symptoms.  No orders of the defined types were placed in this encounter.   Follow-up: No follow-ups on file.    Carolann Littler, MD

## 2020-12-16 NOTE — Progress Notes (Signed)
Chief Complaint  Patient presents with   Follow-up     HPI: Alisha Brown 80 y.o. come in for follow-up of medications a number of issues  Fu meds   anxiety and other  see last notes    Tried alprazolam half and then another half and felt badly dizziness not helpful so did not continue. Hx of neuropathic pain and nNS VT   Taking every other day .  B12 Her neuropathy symptoms are "Feet on fire and  hard twhen get up in am   feels ice water  upt to thigh "  Not walking  distances   because she gets tired not sure if heart or neuropathy not so much palpitations   Son is diabetic  type 1   checked her sugar 160 1 hour postprandial 140 2-hour postprandial and  108 .  Fasting  Swallowing a little bit difficulty but not as bad as in the past occasional pill stuck Does not want to go through colonoscopy.  No vomiting but has lost about 10 pounds although it is stabilized just not hungry "".  They have moved the past year and has new dog.  Blood pressures been okay in the past has not checked it recently.  ROS: See pertinent positives and negatives per HPI.  Alprazolam 1/2  and then  a week later .  Make  dizzy    Past Medical History:  Diagnosis Date   Allergy    Anemia    Anxiety    Arrhythmia    heart   CHF (congestive heart failure) ([place])    Diverticulosis of colon (without mention of hemorrhage)    GERD with stricture    Headache(784.0)    consult dr Tomma Rakers visual migraine?  agg by HRT?   Heart murmur    Hiatal hernia    Hyperlipidemia    Hypertension    Mitral valve disorder    PVC (premature ventricular contraction)    Seizures ([place])     Family History  Problem Relation Age of Onset   Diabetes Mother    Glaucoma Mother    Diabetes Son    Diabetes Sister        1/2 sister-same mom   Heart murmur Sister    Arrhythmia Sister    Breast cancer Maternal Grandmother    Hypertension Brother    Heart murmur Brother    Arrhythmia Brother    Diabetes Brother     Colon cancer Neg Hx     Social History   Socioeconomic History   Marital status: Married    Spouse name: Not on file   Number of children: 1   Years of education: Not on file   Highest education level: Not on file  Occupational History   Occupation: retired  Tobacco Use   Smoking status: Never   Smokeless tobacco: Never  Vaping Use   Vaping Use: Never used  Substance and Sexual Activity   Alcohol use: No   Drug use: No   Sexual activity: Not Currently    Birth control/protection: None  Other Topics Concern   Not on file  Social History Narrative   HHof 2 dog    Just  Died   [name]    Retired and married   [name]      Dog with health issues  Cooks for him bladder stones   Social Determinants of Health   Financial Resource Strain: Low Risk    Difficulty of Paying Living  Expenses: Not hard at all  Food Insecurity: No Food Insecurity   Worried About Charity fundraiser in the Last Year: Never true   Ran Out of Food in the Last Year: Never true  Transportation Needs: No Transportation Needs   Lack of Transportation (Medical): No   Lack of Transportation (Non-Medical): No  Physical Activity: Inactive   Days of Exercise per Week: 0 days   Minutes of Exercise per Session: 0 min  Stress: No Stress Concern Present   Feeling of Stress : Not at all  Social Connections: Moderately Isolated   Frequency of Communication with Friends and Family: Three times a week   Frequency of Social Gatherings with Friends and Family: Three times a week   Attends Religious Services: Never   Active Member of Clubs or Organizations: No   Attends Archivist Meetings: Never   Marital Status: Married    Outpatient Medications Prior to Visit  Medication Sig Dispense Refill   ALPRAZolam (XANAX) 0.25 MG tablet Take 1 tablet (0.25 mg total) by mouth 3 (three) times daily as needed for anxiety. 24 tablet 0   COVID-19 mRNA vaccine, Pfizer, 30 MCG/0.3ML injection INJECT AS DIRECTED .3 mL 0    famotidine (PEPCID) 20 MG tablet Take 20 mg by mouth daily.     ketotifen (ZADITOR) 0.025 % ophthalmic solution Place 1-2 drops 2 (two) times daily as needed into both eyes (for itchy eye).     Magnesium 500 MG CAPS Take 500 mg by mouth daily.     Methylsulfonylmethane (MSM) 1000 MG CAPS Take 1,000 mg daily by mouth.     Ophthalmic Irrigation Solution (RA STERILE EYE [place] OP) Place 1 application daily into both eyes.     vitamin B-12 (CYANOCOBALAMIN) 1000 MCG tablet Take 1,000 mcg every other day by mouth.     VITAMIN D, CHOLECALCIFEROL, PO Take 10,000 Units by mouth daily.     No facility-administered medications prior to visit.     EXAM:  BP (!) 150/70 (BP Location: Left Arm, Patient Position: Sitting, Cuff Size: Normal)   Pulse 77   Temp 98.3 F (36.8 C) (Oral)   Ht 5\' 5"  (1.651 m)   Wt 141 lb 3.2 oz (64 kg)   SpO2 96%   BMI 23.50 kg/m   Body mass index is 23.5 kg/m.  GENERAL: vitals reviewed and listed above, alert, oriented, appears well hydrated and in no acute distress HEENT: atraumatic, conjunctiva  clear, no obvious abnormalities on inspection of external nose and ears OP : Mast NECK: no obvious masses on inspection palpation abdomen soft without again a megaly guarding or rebound LUNGS: clear to auscultation bilaterally, no wheezes, rales or rhonchi, good air movement CV: HRRR, no clubbing cyanosis or  peripheral edema nl cap refill  MS: moves all extremities without noticeable focal  abnormality PSYCH: pleasant and cooperative, normal speech Lab Results  Component Value Date   WBC 4.9 07/26/2020   HGB 14.1 07/26/2020   HCT 42.0 07/26/2020   PLT 227.0 07/26/2020   GLUCOSE 98 07/26/2020   CHOL 259 (H) 07/26/2020   TRIG 77.0 07/26/2020   HDL 68.90 07/26/2020   LDLDIRECT 169.1 05/30/2012   LDLCALC 175 (H) 07/26/2020   ALT 18 07/26/2020   AST 21 07/26/2020   NA 140 07/26/2020   K 4.0 07/26/2020   CL 105 07/26/2020   CREATININE 0.87 07/26/2020   BUN 17  07/26/2020   CO2 28 07/26/2020   TSH 1.25 07/26/2020   INR  1.0 12/08/2016   HGBA1C 6.2 07/26/2020   BP Readings from Last 3 Encounters:  12/17/20 (!) 150/70  11/18/20 140/70  07/24/20 130/80    ASSESSMENT AND PLAN:  Discussed the following assessment and plan:  Neuropathy - Plan: Ambulatory referral to Neurology  Medication management  Anxiety reaction  NSVT (nonsustained ventricular tachycardia)  Medication side effect - alprazolam  Elevated BP without diagnosis of hypertension Pre diabetec  cont diet change  Prefers less  meds  seems to not tolerated   . Follow   weight  continuing to eat  no new sx.  Could get symptoms with high carb diet if you are diabetes susceptible but is not meet the criteria for diabetes.  I am concerned about her neuropathic symptoms that seem to be somewhat e progression.  Stay on the B12 advise neurology consult about neuropathy causes and interventions.   In regard to the swallowing esophageal intermittent symptoms prefers to wait now but if progresses absolutely needs to see the GI department and we can do referral. She reports a 10 pound weight loss but that is since stabilized again we will follow. She will send in blood pressure readings over 3-day period to get baseline. At this time medicines tried have caused side effects more than how good affects so not trying gabapentin or Lyrica type medicines yet.   -Patient advised to return or notify health care team  if  new concerns arise.  Patient Instructions  Referring to neurology to evaluated the neuropathy . Continue the  b12  Also if swallowing is progressively difficult I want you to see the GI team .  Or if losing weight without trying.   Continue attention to   limiting simple carbs in diet  to avoid getting diabetes.   BP readings 2 x per day for 3-5 days. Standley Brooking. Dawnya Grams M.D.

## 2020-12-17 ENCOUNTER — Encounter: Payer: Self-pay | Admitting: Internal Medicine

## 2020-12-17 ENCOUNTER — Other Ambulatory Visit: Payer: Self-pay

## 2020-12-17 ENCOUNTER — Ambulatory Visit (INDEPENDENT_AMBULATORY_CARE_PROVIDER_SITE_OTHER)
Admission: RE | Admit: 2020-12-17 | Discharge: 2020-12-17 | Disposition: A | Discharge status: Home / Self Care | Payer: Medicare HMO | Source: Ambulatory Visit | Attending: Internal Medicine | Admitting: Internal Medicine

## 2020-12-17 ENCOUNTER — Ambulatory Visit (INDEPENDENT_AMBULATORY_CARE_PROVIDER_SITE_OTHER): Payer: Medicare HMO | Admitting: Internal Medicine

## 2020-12-17 VITALS — BP 150/70 | HR 77 | Temp 98.3°F | Ht 65.0 in | Wt 141.2 lb

## 2020-12-17 DIAGNOSIS — Z780 Asymptomatic menopausal state: Secondary | ICD-10-CM

## 2020-12-17 DIAGNOSIS — F411 Generalized anxiety disorder: Secondary | ICD-10-CM

## 2020-12-17 DIAGNOSIS — Z79899 Other long term (current) drug therapy: Secondary | ICD-10-CM

## 2020-12-17 DIAGNOSIS — I472 Ventricular tachycardia, unspecified: Secondary | ICD-10-CM | POA: Diagnosis not present

## 2020-12-17 DIAGNOSIS — I4729 Other ventricular tachycardia: Secondary | ICD-10-CM

## 2020-12-17 DIAGNOSIS — T424X5A Adverse effect of benzodiazepines, initial encounter: Secondary | ICD-10-CM

## 2020-12-17 DIAGNOSIS — R7303 Prediabetes: Secondary | ICD-10-CM | POA: Diagnosis not present

## 2020-12-17 DIAGNOSIS — G629 Polyneuropathy, unspecified: Secondary | ICD-10-CM

## 2020-12-17 DIAGNOSIS — T887XXA Unspecified adverse effect of drug or medicament, initial encounter: Secondary | ICD-10-CM

## 2020-12-17 DIAGNOSIS — R030 Elevated blood-pressure reading, without diagnosis of hypertension: Secondary | ICD-10-CM

## 2020-12-17 DIAGNOSIS — F419 Anxiety disorder, unspecified: Secondary | ICD-10-CM

## 2020-12-17 NOTE — Patient Instructions (Addendum)
Referring to neurology to evaluated the neuropathy . Continue the  b12  Also if swallowing is progressively difficult I want you to see the GI team .  Or if losing weight without trying.   Continue attention to   limiting simple carbs in diet  to avoid getting diabetes.   BP readings 2 x per day for 3-5 days.

## 2020-12-18 ENCOUNTER — Encounter: Payer: Self-pay | Admitting: Neurology

## 2020-12-18 NOTE — Progress Notes (Signed)
Bone density is slightly lower but still in the risk range. Continue weightbearing exercises adequate calcium vitamin D in diet or supplement.

## 2021-03-07 ENCOUNTER — Encounter: Payer: Self-pay | Admitting: Neurology

## 2021-03-07 ENCOUNTER — Other Ambulatory Visit: Payer: Self-pay

## 2021-03-07 ENCOUNTER — Ambulatory Visit: Payer: Medicare HMO | Admitting: Neurology

## 2021-03-07 VITALS — BP 146/77 | HR 70 | Ht 65.0 in | Wt 142.8 lb

## 2021-03-07 DIAGNOSIS — R202 Paresthesia of skin: Secondary | ICD-10-CM

## 2021-03-07 NOTE — Progress Notes (Signed)
[facility] Clinic Note - Initial Visit   Date: 03/07/21  Alisha Brown MRN: [id number] DOB: November 20, 1940   Dear Dr. Regis Bill:  Thank you for your kind referral of Alisha Brown for consultation of neuropathy. Although her history is well known to you, please allow [place] to reiterate it for the purpose of our medical record. The patient was accompanied to the clinic by self.   History of Present Illness: Alisha Brown is a 81 y.o. right-handed female with GERD, CHF, hypertension, hyperlipidemia and pseudoseizures presenting for evaluation of neuropathy.   She complains of chronic burning sensation involving the feet and lower legs, but she has cold sensation as if her legs are in ice up to the level of the knees. Symptoms are improved with heating pad.  She denies imbalance or weakness.  No low back pain.  Her bilateral leg pain has been ongoing for many years.  In fact, she was evaluated by me in 2016 at which time serology testing for causes of neuropathy returned negative.  She opted not to proceed with electrodiagnostic testing.  She has researched her symptoms online and started vitamin B 12 and B1 supplements for neuropathy.  She feels that the combination of this has helped the severity of her symptoms.  She denies numbness or tingling involving the hands.  Out-side paper records, electronic medical record, and images have been reviewed where available and summarized as:  Lab Results  Component Value Date   HGBA1C 6.2 07/26/2020   Lab Results  Component Value Date   VITAMINB12 612 07/26/2020   Lab Results  Component Value Date   TSH 1.25 07/26/2020   Lab Results  Component Value Date   ESRSEDRATE 13 02/02/2014    Past Medical History:  Diagnosis Date   Allergy    Anemia    Anxiety    Arrhythmia    heart   CHF (congestive heart failure) ([place])    Diverticulosis of colon (without mention of hemorrhage)    GERD with stricture     Headache(784.0)    consult dr Tomma Rakers visual migraine?  agg by HRT?   Heart murmur    Hiatal hernia    Hyperlipidemia    Hypertension    Mitral valve disorder    PVC (premature ventricular contraction)    Seizures ([place])     Past Surgical History:  Procedure Laterality Date   ABDOMINAL HYSTERECTOMY  1987   ADENOIDECTOMY     APPENDECTOMY  1987   BREAST EXCISIONAL BIOPSY Right 1993   BREAST SURGERY Right 1990   biopsy,benign   CARDIAC CATHETERIZATION  2009   No CAD. LVEF 50%.   COLONOSCOPY     ESOPHAGOGASTRODUODENOSCOPY     FOOT SURGERY Right 2011   RIGHT/LEFT HEART CATH AND CORONARY ANGIOGRAPHY N/A 12/10/2016   Procedure: RIGHT/LEFT HEART CATH AND CORONARY ANGIOGRAPHY;  Surgeon: Nelva Bush, MD;  Location: [facility];  Service: Cardiovascular;  Laterality: N/A;   TONSILLECTOMY  1953   TUBAL LIGATION       Medications:  Outpatient Encounter Medications as of 03/07/2021  Medication Sig   famotidine (PEPCID) 20 MG tablet Take 20 mg by mouth daily.   Methylsulfonylmethane (MSM) 1000 MG CAPS Take 1,000 mg daily by mouth.   thiamine 100 MG tablet Take 100 mg by mouth daily.   vitamin B-12 (CYANOCOBALAMIN) 1000 MCG tablet Take 1,000 mcg every other day by mouth.   VITAMIN D, CHOLECALCIFEROL, PO Take 10,000 Units by mouth daily.   [  DISCONTINUED] ALPRAZolam (XANAX) 0.25 MG tablet Take 1 tablet (0.25 mg total) by mouth 3 (three) times daily as needed for anxiety.   [DISCONTINUED] ketotifen (ZADITOR) 0.025 % ophthalmic solution Place 1-2 drops 2 (two) times daily as needed into both eyes (for itchy eye).   [DISCONTINUED] Magnesium 500 MG CAPS Take 500 mg by mouth daily.   [DISCONTINUED] Ophthalmic Irrigation Solution (RA STERILE EYE [place] OP) Place 1 application daily into both eyes.   No facility-administered encounter medications on file as of 03/07/2021.    Allergies:  Allergies  Allergen Reactions   Contrast Media [Iodinated Contrast Media] Hives    Hives/nausea    Xanax [Alprazolam] Nausea Only and Other (See Comments)    Dizziness   Aleve [Naproxen Sodium] Hives   Augmentin [Amoxicillin-Pot Clavulanate] Diarrhea and Nausea And Vomiting    Has patient had a PCN reaction causing immediate rash, facial/tongue/throat swelling, SOB or lightheadedness with hypotension: No Has patient had a PCN reaction causing severe rash involving mucus membranes or skin necrosis: No Has patient had a PCN reaction that required hospitalization: No Has patient had a PCN reaction occurring within the last 10 years: No--nausea/vomitng ONLY (pt believes dosage was too high) If all of the above answers are "NO", then may proceed with Cephalosporin use.    Codeine Nausea And Vomiting and Other (See Comments)    Dizziness.   Gabapentin Other (See Comments)    pseudo seizures.   Krill Oil Hives   Propoxyphene N-Acetaminophen     REACTION: hives,dizzy,gi upset    Family History: Family History  Problem Relation Age of Onset   Diabetes Mother    Glaucoma Mother    Diabetes Son    Diabetes Sister        1/2 sister-same mom   Heart murmur Sister    Arrhythmia Sister    Breast cancer Maternal Grandmother    Hypertension Brother    Heart murmur Brother    Arrhythmia Brother    Diabetes Brother    Colon cancer Neg Hx     Social History: Social History   Tobacco Use   Smoking status: Never   Smokeless tobacco: Never  Vaping Use   Vaping Use: Never used  Substance Use Topics   Alcohol use: No   Drug use: No   Social History   Social History Narrative   HHof 2 dog    Just  Died   [name] 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  Speech is not dysarthric.  CRANIAL NERVES: II:  No visual field defects.  III-IV-VI: Pupils equal round and reactive to light.  Normal conjugate, extra-ocular eye movements in all directions of gaze.  No nystagmus.  No ptosis.   V:  Normal facial sensation.    VII:  Normal facial symmetry and movements.   VIII:  Normal hearing and vestibular function.   IX-X:  Normal palatal movement.   XI:  Normal shoulder shrug and head rotation.   XII:  Normal tongue strength and range of motion, no deviation or  fasciculation.  MOTOR:  No atrophy, fasciculations or abnormal movements.  No pronator drift.   Upper Extremity:  Right  Left  Deltoid  5/5   5/5   Biceps  5/5   5/5   Triceps  5/5   5/5   Wrist extensors  5/5   5/5   Wrist flexors  5/5   5/5   Finger extensors  5/5   5/5   Finger flexors  5/5   5/5   Dorsal interossei  5/5   5/5   Abductor pollicis  5/5   5/5   Tone (Ashworth scale)  0  0   Lower Extremity:  Right  Left  Hip flexors  5/5   5/5   Knee flexors  5/5   5/5   Knee extensors  5/5   5/5   Dorsiflexors  5/5   5/5   Plantarflexors  5/5   5/5   Toe extensors  5/5   5/5   Toe flexors  5/5   5/5   Tone (Ashworth scale)  0  0   MSRs:  Right        Left                  brachioradialis 2+  2+  biceps 2+  2+  triceps 2+  2+  patellar 2+  2+  ankle jerk 2+  2+  Hoffman no  no  plantar response down  down   SENSORY:  Normal and symmetric perception of light touch, pinprick, vibration, and temperature.  Romberg's sign absent.   COORDINATION/GAIT: Normal finger-to- nose-finger.  Intact rapid alternating movements bilaterally.  Able to rise from a chair without using arms.  Gait narrow based and stable. Tandem and stressed gait intact.    IMPRESSION: Bilateral leg paresthesias, chronic.  Patient's neurological exam was entirely normal with preserved distal sensation, reflexes, and motor strength.  There is no sensory ataxia.  I offered  electrodiagnostic testing to further evaluate her symptoms, however she does not wish to proceed and is very reassured by today's visit and her normal exam.  I explained that even if she does have neuropathy, she does not have risk factors and management is supportive.  He does not wish to be on any medications for this.  All questions were answered.  Return to clinic as needed  Thank you for allowing me to participate in patient's care.  If I can answer any additional questions, I would be pleased to do so.    Sincerely,    Dessie Delcarlo K. Posey Pronto, DO

## 2021-04-22 DIAGNOSIS — B079 Viral wart, unspecified: Secondary | ICD-10-CM | POA: Diagnosis not present

## 2021-04-22 DIAGNOSIS — L918 Other hypertrophic disorders of the skin: Secondary | ICD-10-CM | POA: Diagnosis not present

## 2021-04-22 DIAGNOSIS — L820 Inflamed seborrheic keratosis: Secondary | ICD-10-CM | POA: Diagnosis not present

## 2021-04-22 DIAGNOSIS — Z23 Encounter for immunization: Secondary | ICD-10-CM | POA: Diagnosis not present

## 2021-05-27 ENCOUNTER — Other Ambulatory Visit: Payer: Self-pay | Admitting: Internal Medicine

## 2021-05-27 DIAGNOSIS — Z1231 Encounter for screening mammogram for malignant neoplasm of breast: Secondary | ICD-10-CM

## 2021-06-26 ENCOUNTER — Ambulatory Visit: Payer: Medicare HMO

## 2021-07-06 NOTE — Progress Notes (Unsigned)
Cardiology Office Note Date:  07/06/2021  Patient ID:  Alisha Brown, Alisha Brown 1940/05/20, MRN [id number] PCP:  Burnis Medin, MD  Electrophysiologist: Dr. Lovena Le  ***refresh   Chief Complaint: *** annual visit  History of Present Illness: Alisha Brown is a 81 y.o. female with history of HTN, PVCs, HLD, hietal hernia  She comes in today to be seen for Dr. Lovena Le, last seen by him Feb 2022, she was feeling good, just got a new puppy.  He discussed previously intolerant of several meds, weaned off BB and doing well.  Still had some palpitations but better. No changes were made.  *** symptoms, palps *** lipids, labs... *** echo 2018 40-45%, update echo  Past Medical History:  Diagnosis Date   Allergy    Anemia    Anxiety    Arrhythmia    heart   CHF (congestive heart failure) ([facility])    Diverticulosis of colon (without mention of hemorrhage)    GERD with stricture    Headache(784.0)    consult dr Tomma Rakers visual migraine?  agg by HRT?   Heart murmur    Hiatal hernia    Hyperlipidemia    Hypertension    Mitral valve disorder    PVC (premature ventricular contraction)    Seizures ([place])     Past Surgical History:  Procedure Laterality Date   ABDOMINAL HYSTERECTOMY  1987   ADENOIDECTOMY     APPENDECTOMY  1987   BREAST EXCISIONAL BIOPSY Right 1993   BREAST SURGERY Right 1990   biopsy,benign   CARDIAC CATHETERIZATION  2009   No CAD. LVEF 50%.   COLONOSCOPY     ESOPHAGOGASTRODUODENOSCOPY     FOOT SURGERY Right 2011   RIGHT/LEFT HEART CATH AND CORONARY ANGIOGRAPHY N/A 12/10/2016   Procedure: RIGHT/LEFT HEART CATH AND CORONARY ANGIOGRAPHY;  Surgeon: Nelva Bush, MD;  Location: [facility];  Service: Cardiovascular;  Laterality: N/A;   TONSILLECTOMY  1953   TUBAL LIGATION      Current Outpatient Medications  Medication Sig Dispense Refill   famotidine (PEPCID) 20 MG tablet Take 20 mg by mouth daily.     Methylsulfonylmethane (MSM) 1000 MG CAPS Take 1,000  mg daily by mouth.     thiamine 100 MG tablet Take 100 mg by mouth daily.     vitamin B-12 (CYANOCOBALAMIN) 1000 MCG tablet Take 1,000 mcg every other day by mouth.     VITAMIN D, CHOLECALCIFEROL, PO Take 10,000 Units by mouth daily.     No current facility-administered medications for this visit.    Allergies:   Contrast media [iodinated contrast media], Xanax [alprazolam], Aleve [naproxen sodium], Augmentin [amoxicillin-pot clavulanate], Codeine, Gabapentin, Krill oil, and Propoxyphene n-acetaminophen   Social History:  The patient  reports that she has never smoked. She has never used smokeless tobacco. She reports that she does not drink alcohol and does not use drugs.   Family History:  The patient's family history includes Arrhythmia in her brother and sister; Breast cancer in her maternal grandmother; Diabetes in her brother, mother, sister, and son; Glaucoma in her mother; Heart murmur in her brother and sister; Hypertension in her brother.  ROS:  Please see the history of present illness.    All other systems are reviewed and otherwise negative.   PHYSICAL EXAM:  VS:  There were no vitals taken for this visit. BMI: There is no height or weight on file to calculate BMI. Well nourished, well developed, in no acute distress HEENT: normocephalic, atraumatic  Neck: no JVD, carotid bruits or masses Cardiac:  *** RRR; no significant murmurs, no rubs, or gallops Lungs:  *** CTA b/l, no wheezing, rhonchi or rales Abd: soft, nontender MS: no deformity or *** atrophy Ext: *** no edema Skin: warm and dry, no rash Neuro:  No gross deficits appreciated Psych: euthymic mood, full affect    EKG:  Done today and reviewed by myself shows  ***   10/22/2016: stress myoview Horizontal ST segment depression ST segment depression of 1 mm was noted during stress in the V5 and V6 leads.   1. Borderline significant ST segment depression in V5-6 on Lexiscan stress ECG. 2. Fixed medium-sized,  mild basal to mid inferolateral and basal inferior perfusion defect. Fixed small, moderate-intensity apical perfusion defect. Possible prior infarction.  No ischemia.  3. Ungated study.    Given lack of ischemia on perfusion images, low risk study.    09/23/2016: TTE Study Conclusions  - Left ventricle: The cavity size was normal. Systolic function was    mildly to moderately reduced. The estimated ejection fraction was    in the range of 40% to 45%. Diffuse hypokinesis. Doppler    parameters are consistent with abnormal left ventricular    relaxation (grade 1 diastolic dysfunction).  - Aortic valve: There was trivial regurgitation.  - Mitral valve: Mildly thickened leaflets . There was mild    regurgitation.  - Left atrium: The atrium was mildly dilated.   Impressions:  - Compared to the prior study, there has been no significant    interval change.   Recent Labs: 07/26/2020: ALT 18; BUN 17; Creatinine, Ser 0.87; Hemoglobin 14.1; Platelets 227.0; Potassium 4.0; Sodium 140; TSH 1.25  07/26/2020: Cholesterol 259; HDL 68.90; LDL Cholesterol 175; Total CHOL/HDL Ratio 4; Triglycerides 77.0; VLDL 15.4   CrCl cannot be calculated (Patient's most recent lab result is older than the maximum 21 days allowed.).   Wt Readings from Last 3 Encounters:  03/07/21 142 lb 12.8 oz (64.8 kg)  12/17/20 141 lb 3.2 oz (64 kg)  11/18/20 145 lb 6.4 oz (66 kg)     Other studies reviewed: Additional studies/records reviewed today include: summarized above  ASSESSMENT AND PLAN:  PVCs ***  HTN ***  Disposition: F/u with ***  Current medicines are reviewed at length with the patient today.  The patient did not have any concerns regarding medicines.  Venetia Night, PA-C 07/06/2021 10:36 AM     [facility] [place] [address] [phone] (office)  [phone] (fax)

## 2021-07-07 ENCOUNTER — Ambulatory Visit: Payer: Medicare HMO | Admitting: Physician Assistant

## 2021-07-07 ENCOUNTER — Encounter: Payer: Self-pay | Admitting: Physician Assistant

## 2021-07-07 VITALS — BP 124/76 | HR 88 | Ht 65.0 in | Wt 146.0 lb

## 2021-07-07 DIAGNOSIS — M79605 Pain in left leg: Secondary | ICD-10-CM | POA: Diagnosis not present

## 2021-07-07 DIAGNOSIS — M79604 Pain in right leg: Secondary | ICD-10-CM | POA: Diagnosis not present

## 2021-07-07 DIAGNOSIS — R42 Dizziness and giddiness: Secondary | ICD-10-CM | POA: Diagnosis not present

## 2021-07-07 DIAGNOSIS — I509 Heart failure, unspecified: Secondary | ICD-10-CM | POA: Diagnosis not present

## 2021-07-07 DIAGNOSIS — I493 Ventricular premature depolarization: Secondary | ICD-10-CM | POA: Diagnosis not present

## 2021-07-07 DIAGNOSIS — I110 Hypertensive heart disease with heart failure: Secondary | ICD-10-CM | POA: Diagnosis not present

## 2021-07-07 NOTE — Addendum Note (Signed)
Addended by: Claude Manges on: 07/07/2021 03:15 PM   Modules accepted: Orders

## 2021-07-07 NOTE — Patient Instructions (Addendum)
Medication Instructions:   Your physician recommends that you continue on your current medications as directed. Please refer to the Current Medication list given to you today.   *If you need a refill on your cardiac medications before your next appointment, please call your pharmacy*   Lab Work: [place]   If you have labs (blood work) drawn today and your tests are completely normal, you will receive your results only by: [place] (if you have MyChart) OR A paper copy in the mail If you have any lab test that is abnormal or we need to change your treatment, we will call you to review the results.   Testing/Procedures:Your physician has requested that you have an ankle brachial index (ABI). During this test an ultrasound and blood pressure cuff are used to evaluate the arteries that supply the arms and legs with blood. Allow thirty minutes for this exam. There are no restrictions or special instructions.    Your physician has recommended that you wear an event monitor. Event monitors are medical devices that record the heart's electrical activity. Doctors most often [place] these monitors to diagnose arrhythmias. Arrhythmias are problems with the speed or rhythm of the heartbeat. The monitor is a small, portable device. You can wear one while you do your normal daily activities. This is usually used to diagnose what is causing palpitations/syncope (passing out).    Follow-Up: At [facility], you and your health needs are our priority.  As part of our continuing mission to provide you with exceptional heart care, we have created designated Provider Care Teams.  These Care Teams include your primary Cardiologist (physician) and Advanced Practice Providers (APPs -  Physician Assistants and Nurse Practitioners) who all work together to provide you with the care you need, when you need it.  We recommend signing up for the patient portal called "MyChart".  Sign up information is  provided on this After Visit Summary.  MyChart is used to connect with patients for Virtual Visits (Telemedicine).  Patients are able to view lab/test results, encounter notes, upcoming appointments, etc.  Non-urgent messages can be sent to your provider as well.   To learn more about what you can do with MyChart, go to [email or web address].    Your next appointment:   4 month(s)  The format for your next appointment:   In Person  Provider:   You may see [name] or one of the following Advanced Practice Providers on your designated Care Team:   Alisha Standard, PA-C  1}   Other Instructions  ZIO XT- Long Term Monitor Instructions  Your physician has requested you wear a ZIO patch monitor for 3 days.  This is a single patch monitor. [employer] supplies one patch monitor per enrollment. Additional stickers are not available. Please do not apply patch if you will be having a Nuclear Stress Test,  Echocardiogram, Cardiac CT, MRI, or Chest Xray during the period you would be wearing the  monitor. The patch cannot be worn during these tests. You cannot remove and re-apply the  ZIO XT patch monitor.  Your ZIO patch monitor will be mailed 3 day USPS to your address on file. It may take 3-5 days  to receive your monitor after you have been enrolled.  Once you have received your monitor, please review the enclosed instructions. Your monitor  has already been registered assigning a specific monitor serial # to you.  Billing and Patient Assistance Program Information  We have supplied [employer] with  any of your insurance information on file for billing purposes. [employer] offers a sliding scale Patient Assistance Program for patients that do not have  insurance, or whose insurance does not completely cover the cost of the ZIO monitor.  You must apply for the Patient Assistance Program to qualify for this discounted rate.  To apply, please call [employer] at [phone], select option 4, select option  2, ask to apply for  Patient Assistance Program. Alisha Brown will ask your household income, and how many people  are in your household. They will quote your out-of-pocket cost based on that information.  [employer] will also be able to set up a 59-month interest-free payment plan if needed.  Applying the monitor   Shave hair from upper left chest.  Hold abrader disc by orange tab. Rub abrader in 40 strokes over the upper left chest as  indicated in your monitor instructions.  Clean area with 4 enclosed alcohol pads. Let dry.  Apply patch as indicated in monitor instructions. Patch will be placed under collarbone on left  side of chest with arrow pointing upward.  Rub patch adhesive wings for 2 minutes. Remove white label marked "1". Remove the white  label marked "2". Rub patch adhesive wings for 2 additional minutes.  While looking in a mirror, press and release button in center of patch. A small green light will  flash 3-4 times. This will be your only indicator that the monitor has been turned on.  Do not shower for the first 24 hours. You may shower after the first 24 hours.  Press the button if you feel a symptom. You will hear a small click. Record Date, Time and  Symptom in the Patient Logbook.  When you are ready to remove the patch, follow instructions on the last 2 pages of Patient  Logbook. Stick patch monitor onto the last page of Patient Logbook.  Place Patient Logbook in the blue and white box. Use locking tab on box and tape box closed  securely. The blue and white box has prepaid postage on it. Please place it in the mailbox as  soon as possible. Your physician should have your test results approximately 7 days after the  monitor has been [facility]  Call [facility] [phone] you have questions regarding  your ZIO XT patch monitor. Call them immediately if you see an orange light blinking on your  monitor.  If your monitor falls  off in less than 4 days, contact our Monitor department at [phone]  If your monitor becomes loose or falls off after 4 days call [employer] at [phone]  suggestions on securing your monitor   Important Information About Sugar

## 2021-07-08 ENCOUNTER — Ambulatory Visit (INDEPENDENT_AMBULATORY_CARE_PROVIDER_SITE_OTHER): Payer: Medicare HMO

## 2021-07-08 VITALS — Ht 65.0 in | Wt 146.0 lb

## 2021-07-08 DIAGNOSIS — Z0000 Encounter for general adult medical examination without abnormal findings: Secondary | ICD-10-CM

## 2021-07-08 NOTE — Patient Instructions (Addendum)
[name] , Thank you for taking time to come for your Medicare Wellness Visit. I appreciate your ongoing commitment to your health goals. Please review the following plan we discussed and let me know if I can assist you in the future.   These are the goals we discussed:  Goals       Patient stated (pt-stated)      Work on garden and get house organized        This is a list of the screening recommended for you and due dates:  Health Maintenance  Topic Date Due   COVID-19 Vaccine (4 - Booster for [employer] series) 07/24/2021*   Zoster (Shingles) Vaccine (1 of 2) 10/08/2021*   Flu Shot  08/26/2021   Tetanus Vaccine  09/24/2021   Pneumonia Vaccine  Completed   DEXA scan (bone density measurement)  Completed   Hepatitis C Screening: USPSTF Recommendation to screen - Ages 26-79 yo.  Completed   HPV Vaccine  Aged Out   Colon Cancer Screening  Discontinued  *Topic was postponed. The date shown is not the original due date.   Advanced directives: Yes  Conditions/risks identified: None  Next appointment: Follow up in one year for your annual wellness visit     Preventive Care 65 Years and Older, Female Preventive care refers to lifestyle choices and visits with your health care provider that can promote health and wellness. What does preventive care include? A yearly physical exam. This is also called an annual well check. Dental exams once or twice a year. Routine eye exams. Ask your health care provider how often you should have your eyes checked. Personal lifestyle choices, including: Daily care of your teeth and gums. Regular physical activity. Eating a healthy diet. Avoiding tobacco and drug use. Limiting alcohol use. Practicing safe sex. Taking low-dose aspirin every day. Taking vitamin and mineral supplements as recommended by your health care provider. What happens during an annual well check? The services and screenings done by your health care provider during your  annual well check will depend on your age, overall health, lifestyle risk factors, and family history of disease. Counseling  Your health care provider may ask you questions about your: Alcohol use. Tobacco use. Drug use. Emotional well-being. Home and relationship well-being. Sexual activity. Eating habits. History of falls. Memory and ability to understand (cognition). Work and work Statistician. Reproductive health. Screening  You may have the following tests or measurements: Height, weight, and BMI. Blood pressure. Lipid and cholesterol levels. These may be checked every 5 years, or more frequently if you are over 39 years old. Skin check. Lung cancer screening. You may have this screening every year starting at age 88 if you have a 30-pack-year history of smoking and currently smoke or have quit within the past 15 years. Fecal occult blood test (FOBT) of the stool. You may have this test every year starting at age 51. Flexible sigmoidoscopy or colonoscopy. You may have a sigmoidoscopy every 5 years or a colonoscopy every 10 years starting at age 13. Hepatitis C blood test. Hepatitis B blood test. Sexually transmitted disease (STD) testing. Diabetes screening. This is done by checking your blood sugar (glucose) after you have not eaten for a while (fasting). You may have this done every 1-3 years. Bone density scan. This is done to screen for osteoporosis. You may have this done starting at age 44. Mammogram. This may be done every 1-2 years. Talk to your health care provider about how often you  should have regular mammograms. Talk with your health care provider about your test results, treatment options, and if necessary, the need for more tests. Vaccines  Your health care provider may recommend certain vaccines, such as: Influenza vaccine. This is recommended every year. Tetanus, diphtheria, and acellular pertussis (Tdap, Td) vaccine. You may need a Td booster every 10  years. Zoster vaccine. You may need this after age 30. Pneumococcal 13-valent conjugate (PCV13) vaccine. One dose is recommended after age 83. Pneumococcal polysaccharide (PPSV23) vaccine. One dose is recommended after age 20. Talk to your health care provider about which screenings and vaccines you need and how often you need them. This information is not intended to replace advice given to you by your health care provider. Make sure you discuss any questions you have with your health care provider. Document Released: 02/08/2015 Document Revised: 10/02/2015 Document Reviewed: 11/13/2014 Elsevier Interactive Patient Education  2017 [place] Prevention in the Home Falls can cause injuries. They can happen to people of all ages. There are many things you can do to make your home safe and to help prevent falls. What can I do on the outside of my home? Regularly fix the edges of walkways and driveways and fix any cracks. Remove anything that might make you trip as you walk through a door, such as a raised step or threshold. Trim any bushes or trees on the path to your home. Use bright outdoor lighting. Clear any walking paths of anything that might make someone trip, such as rocks or tools. Regularly check to see if handrails are loose or broken. Make sure that both sides of any steps have handrails. Any raised decks and porches should have guardrails on the edges. Have any leaves, snow, or ice cleared regularly. Use sand or salt on walking paths during winter. Clean up any spills in your garage right away. This includes oil or grease spills. What can I do in the bathroom? Use night lights. Install grab bars by the toilet and in the tub and shower. Do not use towel bars as grab bars. Use non-skid mats or decals in the tub or shower. If you need to sit down in the shower, use a plastic, non-slip stool. Keep the floor dry. Clean up any water that spills on the floor as soon as it  happens. Remove soap buildup in the tub or shower regularly. Attach bath mats securely with double-sided non-slip rug tape. Do not have throw rugs and other things on the floor that can make you trip. What can I do in the bedroom? Use night lights. Make sure that you have a light by your bed that is easy to reach. Do not use any sheets or blankets that are too big for your bed. They should not hang down onto the floor. Have a firm chair that has side arms. You can use this for support while you get dressed. Do not have throw rugs and other things on the floor that can make you trip. What can I do in the kitchen? Clean up any spills right away. Avoid walking on wet floors. Keep items that you use a lot in easy-to-reach places. If you need to reach something above you, use a strong step stool that has a grab bar. Keep electrical cords out of the way. Do not use floor polish or wax that makes floors slippery. If you must use wax, use non-skid floor wax. Do not have throw rugs and other things on the  floor that can make you trip. What can I do with my stairs? Do not leave any items on the stairs. Make sure that there are handrails on both sides of the stairs and use them. Fix handrails that are broken or loose. Make sure that handrails are as long as the stairways. Check any carpeting to make sure that it is firmly attached to the stairs. Fix any carpet that is loose or worn. Avoid having throw rugs at the top or bottom of the stairs. If you do have throw rugs, attach them to the floor with carpet tape. Make sure that you have a light switch at the top of the stairs and the bottom of the stairs. If you do not have them, ask someone to add them for you. What else can I do to help prevent falls? Wear shoes that: Do not have high heels. Have rubber bottoms. Are comfortable and fit you well. Are closed at the toe. Do not wear sandals. If you use a stepladder: Make sure that it is fully opened.  Do not climb a closed stepladder. Make sure that both sides of the stepladder are locked into place. Ask someone to hold it for you, if possible. Clearly mark and make sure that you can see: Any grab bars or handrails. First and last steps. Where the edge of each step is. Use tools that help you move around (mobility aids) if they are needed. These include: Canes. Walkers. Scooters. Crutches. Turn on the lights when you go into a dark area. Replace any light bulbs as soon as they burn out. Set up your furniture so you have a clear path. Avoid moving your furniture around. If any of your floors are uneven, fix them. If there are any pets around you, be aware of where they are. Review your medicines with your doctor. Some medicines can make you feel dizzy. This can increase your chance of falling. Ask your doctor what other things that you can do to help prevent falls. This information is not intended to replace advice given to you by your health care provider. Make sure you discuss any questions you have with your health care provider. Document Released: 11/08/2008 Document Revised: 06/20/2015 Document Reviewed: 02/16/2014 Elsevier Interactive Patient Education  2017 [employer].

## 2021-07-08 NOTE — Progress Notes (Signed)
Subjective:   Alisha Brown is a 81 y.o. female who presents for Medicare Annual (Subsequent) preventive examination.  Review of Systems    Virtual Visit via Telephone Note  I connected with  Alisha Brown on 07/08/21 at  1:00 PM EDT by telephone and verified that I am speaking with the correct person using two identifiers.  Location: Patient: Home Provider: Office Persons participating in the virtual visit: patient/Nurse Health Advisor   I discussed the limitations, risks, security and privacy concerns of performing an evaluation and management service by telephone and the availability of in person appointments. The patient expressed understanding and agreed to proceed.  Interactive audio and video telecommunications were attempted between this nurse and patient, however failed, due to patient having technical difficulties OR patient did not have access to video capability.  We continued and completed visit with audio only.  Some vital signs may be absent or patient reported.   Criselda Peaches, LPN  Cardiac Risk Factors include: advanced age (>22mn, >>3women);hypertension     Objective:    Today's Vitals   07/08/21 1307  Weight: 146 lb (66.2 kg)  Height: '5\' 5"'$  (1.651 m)   Body mass index is 24.3 kg/m.     07/08/2021    1:14 PM 03/07/2021   12:34 PM 07/02/2020    9:09 AM 12/10/2016   12:28 PM  Advanced Directives  Does Patient Have a Medical Advance Directive? Yes Yes Yes No  Type of [place] will Healthcare Power of [place]   Does patient want to make changes to medical advance directive? No - Patient declined     Copy of HA[place] Chart? No - copy requested  No - copy requested   Would patient like information on creating a medical advance directive?    No - Patient declined    Current Medications (verified) Outpatient Encounter Medications as of  07/08/2021  Medication Sig   famotidine (PEPCID) 20 MG tablet Take 20 mg by mouth daily.   Methylsulfonylmethane (MSM) 1000 MG CAPS Take 1,000 mg daily by mouth.   thiamine 100 MG tablet Take 100 mg by mouth daily.   vitamin B-12 (CYANOCOBALAMIN) 1000 MCG tablet Take 1,000 mcg every other day by mouth.   VITAMIN D, CHOLECALCIFEROL, PO Take 10,000 Units by mouth daily.   No facility-administered encounter medications on file as of 07/08/2021.    Allergies (verified) Contrast media [iodinated contrast media], Xanax [alprazolam], Aleve [naproxen sodium], Augmentin [amoxicillin-pot clavulanate], Codeine, Gabapentin, Krill oil, and Propoxyphene n-acetaminophen   History: Past Medical History:  Diagnosis Date   Allergy    Anemia    Anxiety    Arrhythmia    heart   CHF (congestive heart failure) ([place]    Diverticulosis of colon (without mention of hemorrhage)    GERD with stricture    Headache(784.0)    consult dr sTomma Rakersvisual migraine?  agg by HRT?   Heart murmur    Hiatal hernia    Hyperlipidemia    Hypertension    Mitral valve disorder    PVC (premature ventricular contraction)    Seizures ([place]    Past Surgical History:  Procedure Laterality Date   ABDOMINAL HYSTERECTOMY  1987   ADENOIDECTOMY     APPENDECTOMY  1987   BREAST EXCISIONAL BIOPSY Right 1993   BREAST SURGERY Right 1990   biopsy,benign   CARDIAC CATHETERIZATION  2009   No CAD. LVEF  50%.   COLONOSCOPY     ESOPHAGOGASTRODUODENOSCOPY     FOOT SURGERY Right 2011   RIGHT/LEFT HEART CATH AND CORONARY ANGIOGRAPHY N/A 12/10/2016   Procedure: RIGHT/LEFT HEART CATH AND CORONARY ANGIOGRAPHY;  Surgeon: Nelva Bush, MD;  Location: [facility];  Service: Cardiovascular;  Laterality: N/A;   TONSILLECTOMY  1953   TUBAL LIGATION     Family History  Problem Relation Age of Onset   Diabetes Mother    Glaucoma Mother    Diabetes Son    Diabetes Sister        1/2 sister-same mom   Heart murmur Sister     Arrhythmia Sister    Breast cancer Maternal Grandmother    Hypertension Brother    Heart murmur Brother    Arrhythmia Brother    Diabetes Brother    Colon cancer Neg Hx    Social History   Socioeconomic History   Marital status: Married    Spouse name: Not on file   Number of children: 1   Years of education: Not on file   Highest education level: Not on file  Occupational History   Occupation: retired  Tobacco Use   Smoking status: Never   Smokeless tobacco: Never  Vaping Use   Vaping Use: Never used  Substance and Sexual Activity   Alcohol use: No   Drug use: No   Sexual activity: Not Currently    Birth control/protection: None  Other Topics Concern   Not on file  Social History Narrative   HHof 2 dog    Just  Died   [name] Retired and [place] with health issues  Cooks for him bladder stones right handed    Social Determinants of Health   Financial Resource Strain: Low Risk  (07/08/2021)   Overall Financial Resource Strain (CARDIA)    Difficulty of Paying Living Expenses: Not hard at all  Food Insecurity: No Food Insecurity (07/08/2021)   Hunger Vital Sign    Worried About Running Out of Food in the Last Year: Never true    [place] in the Last Year: Never true  Transportation Needs: No Transportation Needs (07/08/2021)   PRAPARE - Hydrologist (Medical): No    Lack of Transportation (Non-Medical): No  Physical Activity: Insufficiently Active (07/08/2021)   Exercise Vital Sign    Days of Exercise per Week: 1 day    Minutes of Exercise per Session: 10 min  Stress: No Stress Concern Present (07/08/2021)   [place]    Feeling of Stress : Not at all  Social Connections: Moderately Isolated (07/08/2021)   Social Connection and Isolation Panel [NHANES]    Frequency of Communication with Friends and Family: More than three times a week    Frequency of Social  Gatherings with Friends and Family: More than three times a week    Attends Religious Services: Never    Marine scientist or Organizations: No    Attends Archivist Meetings: Never    Marital Status: Married    Tobacco Counseling Counseling given: Not Answered   Clinical Intake:  Pre-visit preparation completed: No Diabetic?  No   Activities of Daily Living    07/08/2021    1:12 PM  In your present state of health, do you have any difficulty performing the following activities:  Hearing? 0  Vision? 0  Difficulty concentrating or making decisions? 0  Walking or  climbing stairs? 0  Dressing or bathing? 0  Doing errands, shopping? 0  Preparing Food and eating ? N  Using the Toilet? N  In the past six months, have you accidently leaked urine? N  Do you have problems with loss of bowel control? N  Managing your Medications? N  Managing your Finances? N  Housekeeping or managing your Housekeeping? N    Patient Care Team: Panosh, Standley Brooking, MD as PCP - General End, Harrell Gave, MD as PCP - Cardiology (Cardiology) Evans Lance, MD (Cardiology) Jari Pigg, MD (Dermatology) Idolina Primer  for eye exams  Alda Berthold, DO as Consulting Physician (Neurology)  Indicate any recent Medical Services you may have received from other than [facility] providers in the past year (date may be approximate).     Assessment:   This is a routine wellness examination for [place].  Hearing/Vision screen Hearing Screening - Comments:: No hearing difficulty Vision Screening - Comments:: Wears glasses.Followed by Dr Idolina Primer  Dietary issues and exercise activities discussed: Exercise limited by: None identified   Goals Addressed               This Visit's Progress     Patient stated (pt-stated)        Work on garden and get house organized       Depression Screen    07/08/2021    1:10 PM 07/02/2020    9:11 AM 07/02/2020    9:06 AM 09/21/2017   10:59 AM 09/18/2016   10:16 AM  05/03/2013    1:55 PM  PHQ 2/9 Scores  PHQ - 2 Score 0 0 0 0 0 0    Fall Risk    07/08/2021    1:12 PM 03/07/2021   12:34 PM 07/02/2020    9:11 AM 09/21/2017   10:59 AM 09/18/2016   10:16 AM  Fall Risk   Falls in the past year? 1 1 0 No No  Number falls in past yr: 0 1 0    Injury with Fall? 0 1 0    Comment No injury or medical attention needed      Risk for fall due to : No Fall Risks      Follow up   Falls evaluation completed      Hope:  Any stairs in or around the home? No  If so, are there any without handrails? No  Home free of loose throw rugs in walkways, pet beds, electrical cords, etc? Yes  Adequate lighting in your home to reduce risk of falls? Yes   ASSISTIVE DEVICES UTILIZED TO PREVENT FALLS:  Life alert? No  Use of a cane, walker or w/c? No  Grab bars in the bathroom? Yes Shower chair or bench in shower? Yes  Elevated toilet seat or a handicapped toilet? No   TIMED UP AND GO:  Was the test performed? No . Audio Visit  Cognitive Function:      07/08/2021    1:15 PM  6CIT Screen  What Year? 0 points  What month? 0 points  What time? 0 points  Count back from 20 0 points  Months in reverse 0 points  Repeat phrase 0 points  Total Score 0 points    Immunizations Immunization History  Administered Date(s) Administered   Influenza Split 12/01/2011   Influenza Whole 02/04/2009, 10/30/2009   Influenza, High Dose Seasonal PF 11/24/2012, 11/28/2013, 12/01/2017   Influenza,inj,Quad PF,6+ Mos 03/01/2017   PFIZER(Purple Top)SARS-COV-2 Vaccination 03/23/2019,  04/18/2019, 02/06/2020   Pneumococcal Conjugate-13 05/03/2013   Pneumococcal Polysaccharide-23 03/19/2010   Td 01/27/2003   Tdap 09/25/2011   Zoster, Live 08/26/2010      Flu Vaccine status: Declined, Education has been provided regarding the importance of this vaccine but patient still declined. Advised may receive this vaccine at local pharmacy or Health  Dept. Aware to provide a copy of the vaccination record if obtained from local pharmacy or Health Dept. Verbalized acceptance and understanding.  Pneumococcal vaccine status: Up to date  Covid-19 vaccine status: Completed vaccines  Qualifies for Shingles Vaccine? Yes   Zostavax completed No   Shingrix Completed?: No.    Education has been provided regarding the importance of this vaccine. Patient has been advised to call insurance company to determine out of pocket expense if they have not yet received this vaccine. Advised may also receive vaccine at local pharmacy or Health Dept. Verbalized acceptance and understanding.  Screening Tests Health Maintenance  Topic Date Due   COVID-19 Vaccine (4 - Booster for Pfizer series) 07/24/2021 (Originally 04/02/2020)   Zoster Vaccines- Shingrix (1 of 2) 10/08/2021 (Originally 10/26/1959)   INFLUENZA VACCINE  08/26/2021   TETANUS/TDAP  09/24/2021   Pneumonia Vaccine 25+ Years old  Completed   DEXA SCAN  Completed   Hepatitis C Screening  Completed   HPV VACCINES  Aged Out   COLONOSCOPY (Pts 45-36yr Insurance coverage will need to be confirmed)  Discontinued    Health Maintenance  There are no preventive care reminders to display for this patient.   Colorectal cancer screening: No longer required.   Mammogram status: No longer required due to Age.  Bone Density status: Completed 12/17/20. Results reflect: Bone density results: OSTEOPOROSIS. Repeat every   years.  Lung Cancer Screening: (Low Dose CT Chest recommended if Age 864-80years, 30 pack-year currently smoking OR have quit w/in 15years.) does not qualify.    Additional Screening:  Hepatitis C Screening: does qualify; Completed 12/04/14  Vision Screening: Recommended annual ophthalmology exams for early detection of glaucoma and other disorders of the eye. Is the patient up to date with their annual eye exam?  Yes  Who is the provider or what is the name of the office in which the  patient attends annual eye exams? Dr DIdolina PrimerIf pt is not established with a provider, would they like to be referred to a provider to establish care? No .   Dental Screening: Recommended annual dental exams for proper oral hygiene  Community Resource Referral / Chronic Care Management:     CRR required this visit?  No   CCM required this visit?  No      Plan:     I have personally reviewed and noted the following in the patient's chart:   Medical and social history Use of alcohol, tobacco or illicit drugs  Current medications and supplements including opioid prescriptions.  Functional ability and status Nutritional status Physical activity Advanced directives List of other physicians Hospitalizations, surgeries, and ER visits in previous 12 months Vitals Screenings to include cognitive, depression, and falls Referrals and appointments  In addition, I have reviewed and discussed with patient certain preventive protocols, quality metrics, and best practice recommendations. A written personalized care plan for preventive services as well as general preventive health recommendations were provided to patient.     BCriselda Peaches LPN   [id number]  Nurse Notes: None

## 2021-07-10 IMAGING — MG DIGITAL SCREENING BILAT W/ TOMO W/ CAD
6 of 10 series · 6 of 30 positions shown · non-contrast
Comparison: Previous exam(s).

CLINICAL DATA: Screening.

EXAM:
DIGITAL SCREENING BILATERAL MAMMOGRAM WITH TOMO AND CAD

[L MLO synth-2D (1 of 2)]
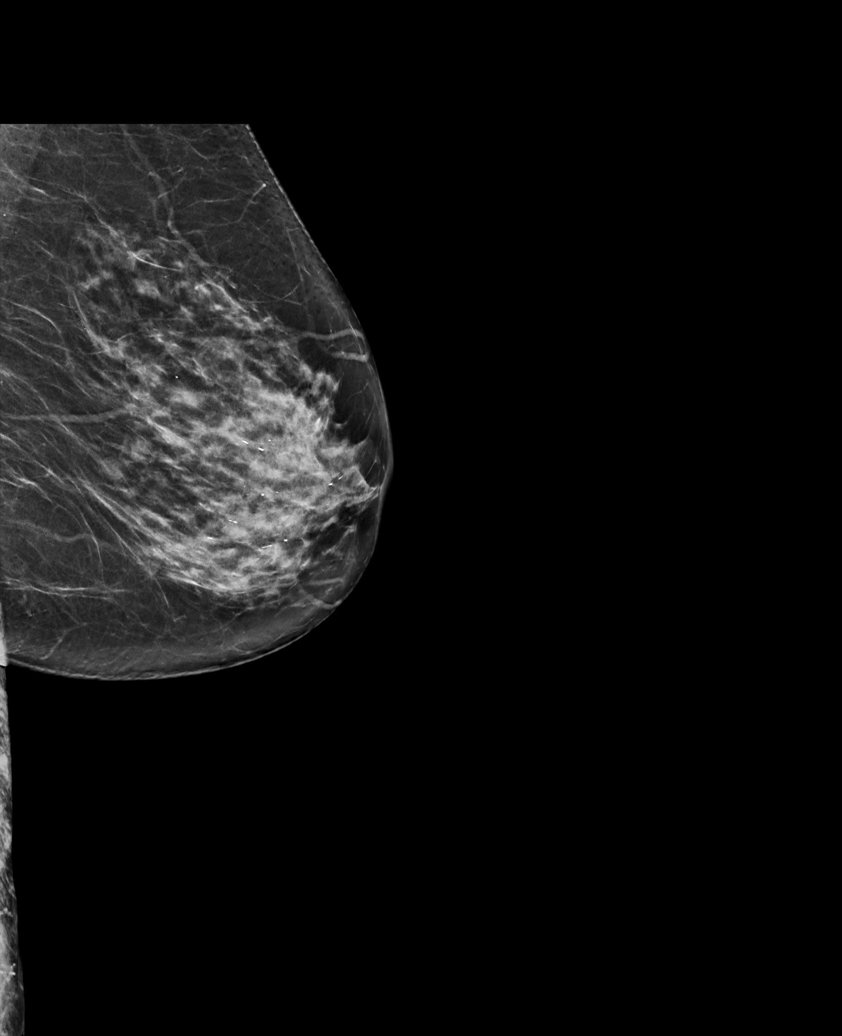

[R CC synth-2D]
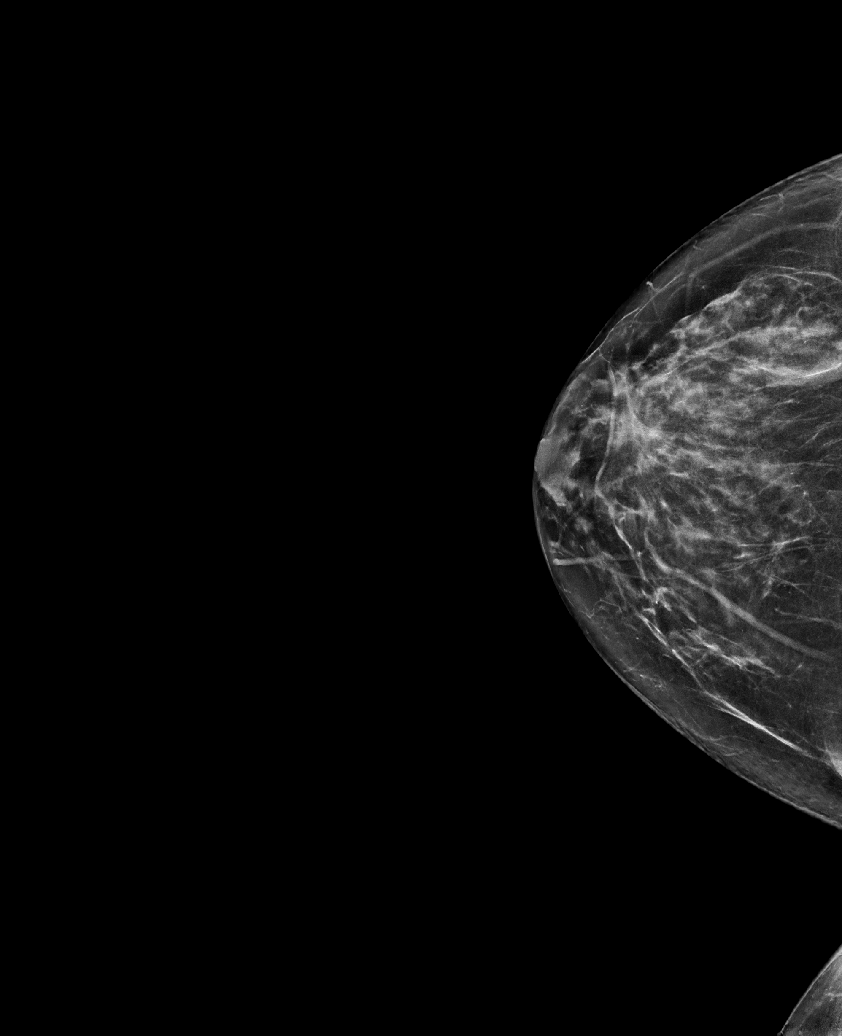

[R MLO synth-2D]
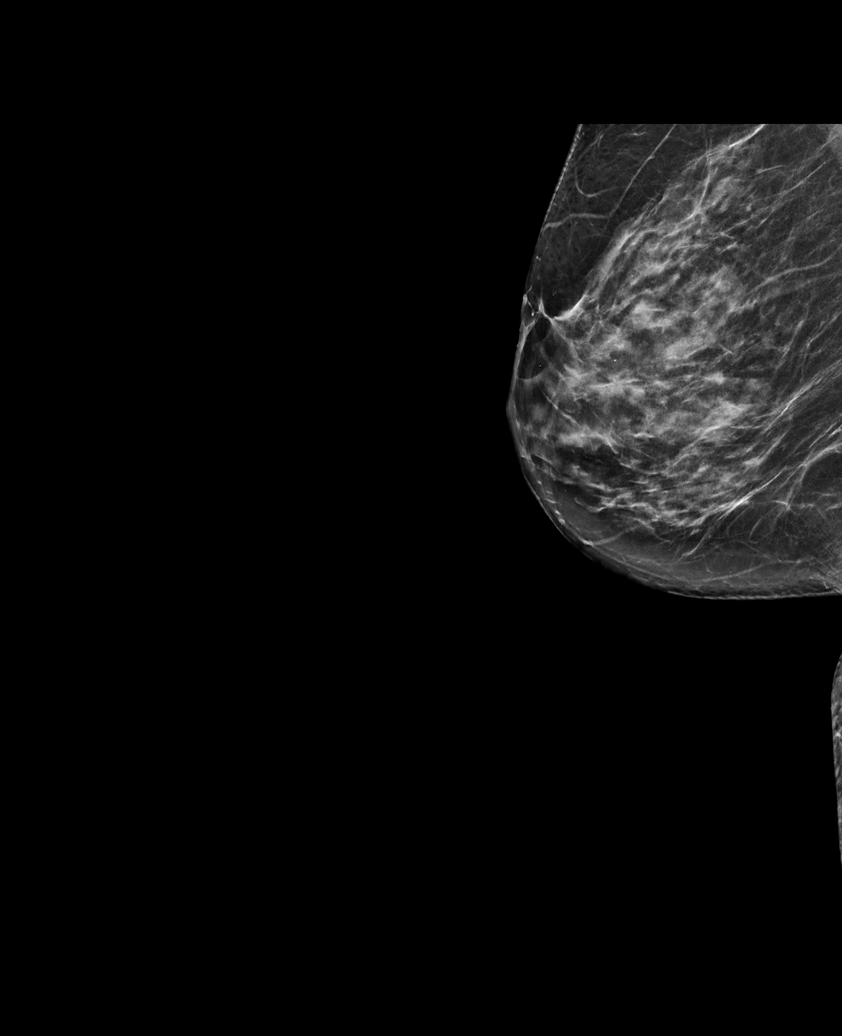

[L CC synth-2D]
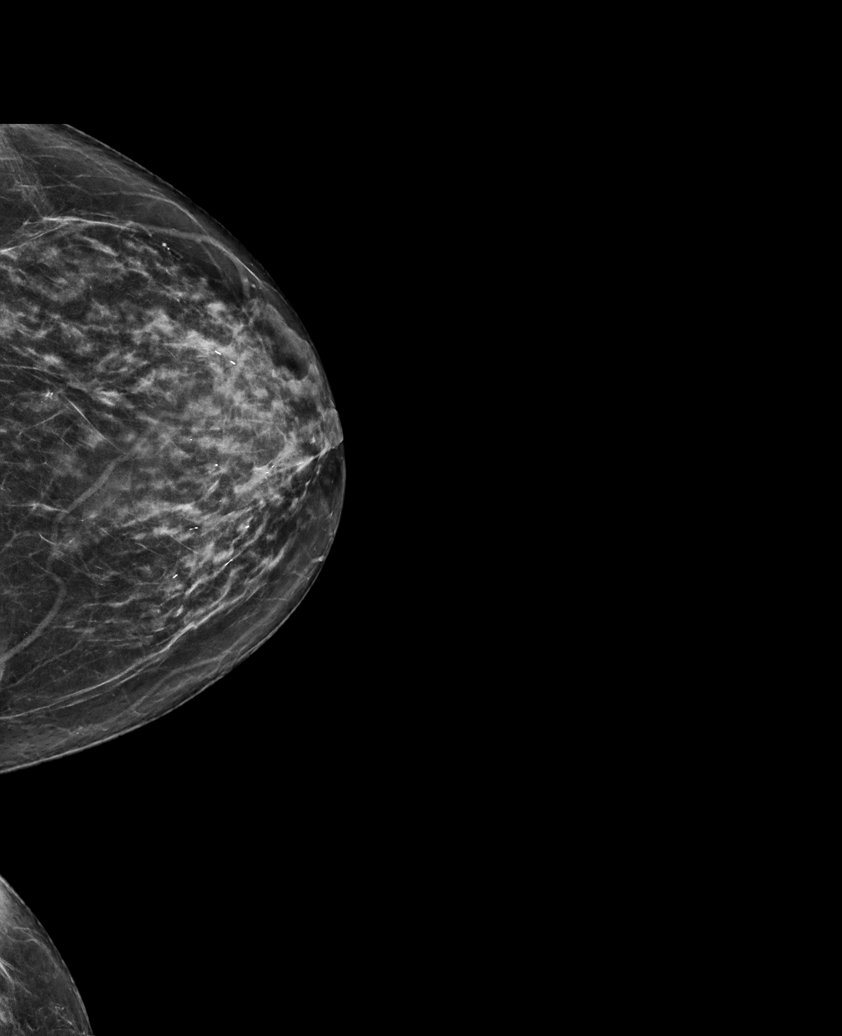

[L MLO synth-2D (2 of 2)]
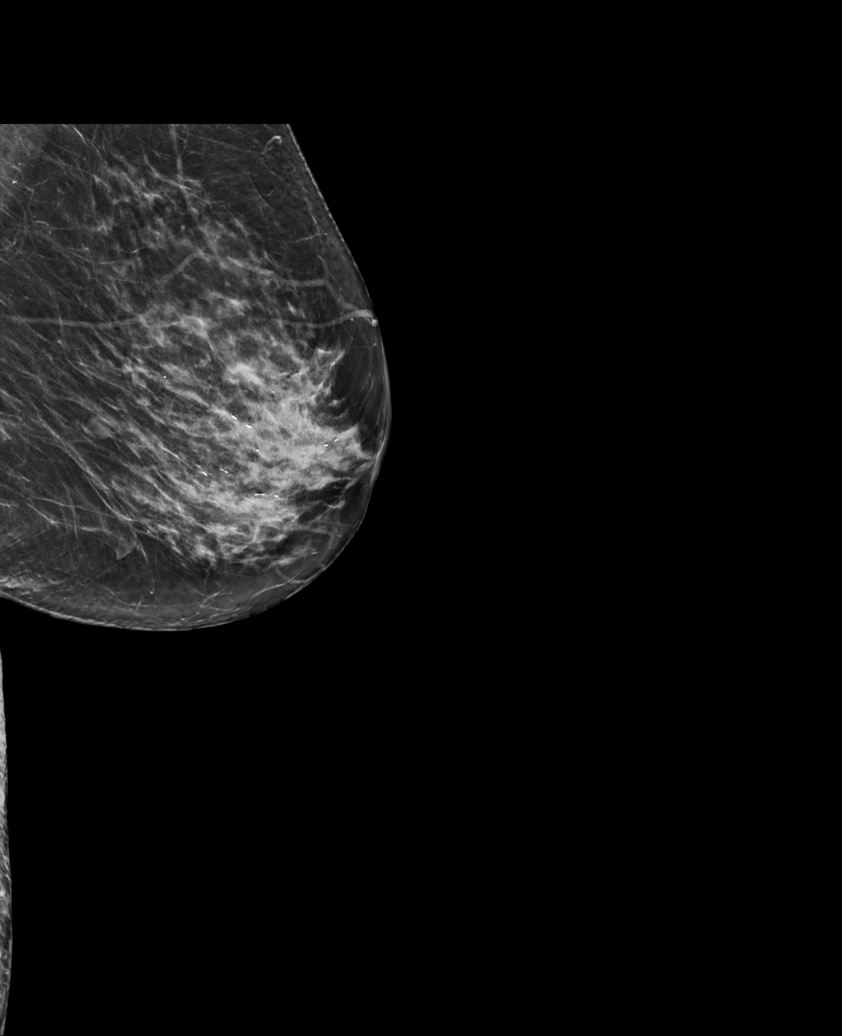

[L MLO tomo · tomo slice 35/70.0]
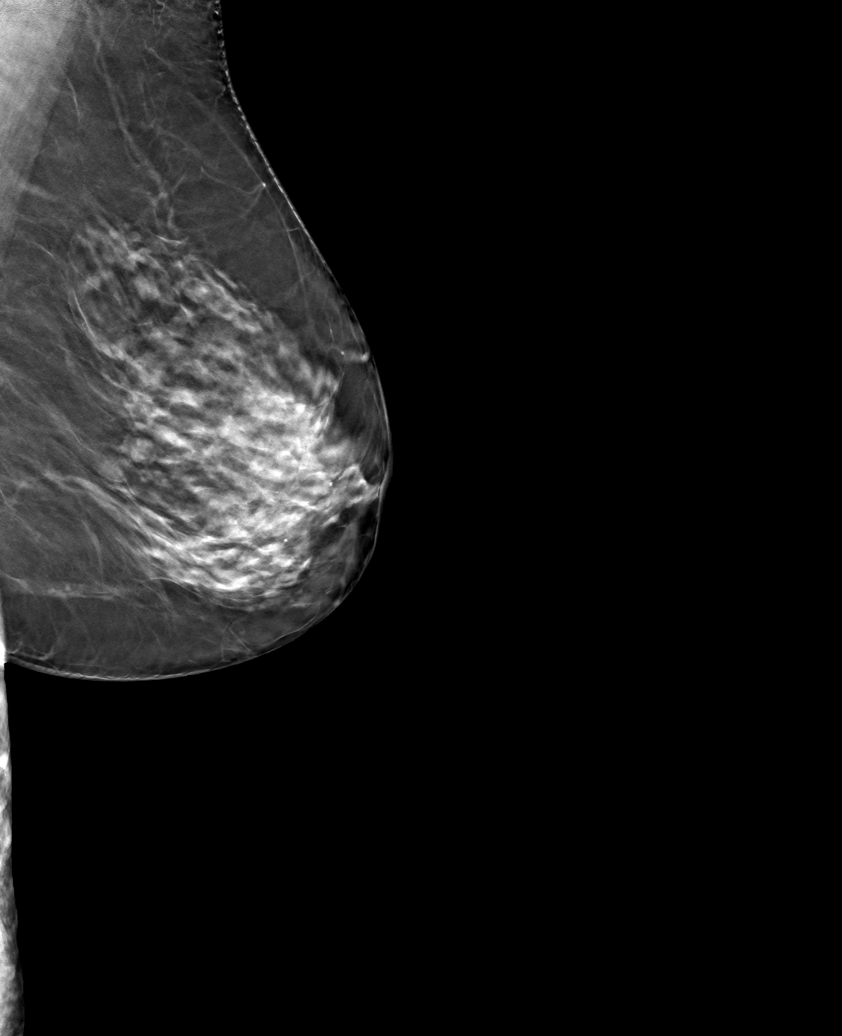

[6 of 30 positions shown; findings below may reference images not displayed]

ACR Breast Density Category c: The breast tissue is heterogeneously
dense, which may obscure small masses.
FINDINGS: There are no findings suspicious for malignancy. Images were
processed with CAD.
IMPRESSION: No mammographic evidence of malignancy. A result letter of this
screening mammogram will be mailed directly to the patient.

RECOMMENDATION:
Screening mammogram in one year. (Code:[id number])

BI-RADS CATEGORY  1: Negative.

## 2021-07-17 ENCOUNTER — Ambulatory Visit (INDEPENDENT_AMBULATORY_CARE_PROVIDER_SITE_OTHER): Payer: Medicare HMO

## 2021-07-17 DIAGNOSIS — Z1231 Encounter for screening mammogram for malignant neoplasm of breast: Secondary | ICD-10-CM

## 2021-07-17 DIAGNOSIS — R42 Dizziness and giddiness: Secondary | ICD-10-CM

## 2021-07-17 DIAGNOSIS — I493 Ventricular premature depolarization: Secondary | ICD-10-CM

## 2021-07-17 NOTE — Telephone Encounter (Signed)
Spoke with patient.  Appears order had been placed to be done at our [place] office instead of the [employer].  As of 07/17/21 it did not appear anyone had rectified the error.   Explained to patient , I will correct her order tonite, and request [employer] to expedite mailing her monitor to her address in [place].

## 2021-07-17 NOTE — Progress Notes (Unsigned)
Enrolled for [employer] to mail a ZIO XT long term holter monitor to the patients address on file.  Expedite shipping requested.   See phone note.  Dr. Lovena Le to read.

## 2021-07-23 ENCOUNTER — Ambulatory Visit (HOSPITAL_COMMUNITY)
Admission: RE | Admit: 2021-07-23 | Discharge: 2021-07-23 | Disposition: A | Discharge status: Home / Self Care | Payer: Medicare HMO | Source: Ambulatory Visit | Attending: Cardiology | Admitting: Cardiology

## 2021-07-23 DIAGNOSIS — M79604 Pain in right leg: Secondary | ICD-10-CM | POA: Diagnosis not present

## 2021-07-23 DIAGNOSIS — M79605 Pain in left leg: Secondary | ICD-10-CM | POA: Insufficient documentation

## 2021-07-23 DIAGNOSIS — I493 Ventricular premature depolarization: Secondary | ICD-10-CM

## 2021-07-23 DIAGNOSIS — R42 Dizziness and giddiness: Secondary | ICD-10-CM

## 2021-08-04 DIAGNOSIS — I493 Ventricular premature depolarization: Secondary | ICD-10-CM | POA: Diagnosis not present

## 2021-08-04 DIAGNOSIS — R42 Dizziness and giddiness: Secondary | ICD-10-CM | POA: Diagnosis not present

## 2021-11-11 ENCOUNTER — Ambulatory Visit: Payer: Medicare HMO | Admitting: Physician Assistant

## 2022-07-22 DIAGNOSIS — K219 Gastro-esophageal reflux disease without esophagitis: Secondary | ICD-10-CM | POA: Diagnosis not present

## 2022-07-22 DIAGNOSIS — I428 Other cardiomyopathies: Secondary | ICD-10-CM | POA: Diagnosis not present

## 2022-07-22 DIAGNOSIS — G4700 Insomnia, unspecified: Secondary | ICD-10-CM | POA: Diagnosis not present

## 2022-07-22 DIAGNOSIS — Z0000 Encounter for general adult medical examination without abnormal findings: Secondary | ICD-10-CM | POA: Diagnosis not present

## 2022-07-22 DIAGNOSIS — J309 Allergic rhinitis, unspecified: Secondary | ICD-10-CM | POA: Diagnosis not present

## 2022-07-22 DIAGNOSIS — I4729 Other ventricular tachycardia: Secondary | ICD-10-CM | POA: Diagnosis not present

## 2022-07-22 DIAGNOSIS — R5383 Other fatigue: Secondary | ICD-10-CM | POA: Diagnosis not present

## 2022-07-22 DIAGNOSIS — H9193 Unspecified hearing loss, bilateral: Secondary | ICD-10-CM | POA: Diagnosis not present

## 2022-07-22 DIAGNOSIS — I493 Ventricular premature depolarization: Secondary | ICD-10-CM | POA: Diagnosis not present

## 2022-07-22 DIAGNOSIS — I10 Essential (primary) hypertension: Secondary | ICD-10-CM | POA: Diagnosis not present

## 2022-07-22 DIAGNOSIS — Z79899 Other long term (current) drug therapy: Secondary | ICD-10-CM | POA: Diagnosis not present

## 2022-07-24 DIAGNOSIS — Z1231 Encounter for screening mammogram for malignant neoplasm of breast: Secondary | ICD-10-CM | POA: Diagnosis not present
